# Patient Record
Sex: Female | Born: 1940 | Race: White | Hispanic: No | Smoking: Former smoker
Health system: Southern US, Community
[De-identification: ages and names within clinical notes are randomized; demographics above are authoritative.]

## PROBLEM LIST (undated history)

## (undated) DIAGNOSIS — K635 Polyp of colon: Secondary | ICD-10-CM

## (undated) DIAGNOSIS — K5792 Diverticulitis of intestine, part unspecified, without perforation or abscess without bleeding: Secondary | ICD-10-CM

## (undated) DIAGNOSIS — K228 Other specified diseases of esophagus: Secondary | ICD-10-CM

## (undated) DIAGNOSIS — I639 Cerebral infarction, unspecified: Secondary | ICD-10-CM

## (undated) DIAGNOSIS — T7840XA Allergy, unspecified, initial encounter: Secondary | ICD-10-CM

## (undated) DIAGNOSIS — K921 Melena: Secondary | ICD-10-CM

## (undated) DIAGNOSIS — K2289 Other specified disease of esophagus: Secondary | ICD-10-CM

## (undated) DIAGNOSIS — K922 Gastrointestinal hemorrhage, unspecified: Secondary | ICD-10-CM

## (undated) DIAGNOSIS — K2210 Ulcer of esophagus without bleeding: Secondary | ICD-10-CM

## (undated) DIAGNOSIS — D62 Acute posthemorrhagic anemia: Secondary | ICD-10-CM

## (undated) DIAGNOSIS — K920 Hematemesis: Secondary | ICD-10-CM

## (undated) DIAGNOSIS — K449 Diaphragmatic hernia without obstruction or gangrene: Secondary | ICD-10-CM

## (undated) DIAGNOSIS — E785 Hyperlipidemia, unspecified: Secondary | ICD-10-CM

## (undated) DIAGNOSIS — E1110 Type 2 diabetes mellitus with ketoacidosis without coma: Secondary | ICD-10-CM

## (undated) DIAGNOSIS — K219 Gastro-esophageal reflux disease without esophagitis: Secondary | ICD-10-CM

## (undated) DIAGNOSIS — E119 Type 2 diabetes mellitus without complications: Secondary | ICD-10-CM

## (undated) HISTORY — DX: Diaphragmatic hernia without obstruction or gangrene: K44.9

## (undated) HISTORY — DX: Type 2 diabetes mellitus without complications: E11.9

## (undated) HISTORY — DX: Hyperlipidemia, unspecified: E78.5

## (undated) HISTORY — DX: Diverticulitis of intestine, part unspecified, without perforation or abscess without bleeding: K57.92

## (undated) HISTORY — DX: Cerebral infarction, unspecified: I63.9

## (undated) HISTORY — DX: Polyp of colon: K63.5

## (undated) HISTORY — DX: Gastro-esophageal reflux disease without esophagitis: K21.9

## (undated) HISTORY — DX: Other specified diseases of esophagus: K22.8

## (undated) HISTORY — DX: Ulcer of esophagus without bleeding: K22.10

## (undated) HISTORY — PX: TONSILLECTOMY: SUR1361

## (undated) HISTORY — DX: Allergy, unspecified, initial encounter: T78.40XA

## (undated) HISTORY — DX: Other specified disease of esophagus: K22.89

---
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
## 1995-07-01 HISTORY — PX: CHOLECYSTECTOMY: SHX55

## 1998-06-28 ENCOUNTER — Other Ambulatory Visit: Admission: RE | Admit: 1998-06-28 | Discharge: 1998-06-28 | Payer: Self-pay | Admitting: Obstetrics and Gynecology

## 1999-07-03 ENCOUNTER — Other Ambulatory Visit: Admission: RE | Admit: 1999-07-03 | Discharge: 1999-07-03 | Payer: Self-pay | Admitting: Obstetrics and Gynecology

## 2000-07-28 ENCOUNTER — Other Ambulatory Visit: Admission: RE | Admit: 2000-07-28 | Discharge: 2000-07-28 | Payer: Self-pay | Admitting: Obstetrics and Gynecology

## 2002-05-24 ENCOUNTER — Other Ambulatory Visit: Admission: RE | Admit: 2002-05-24 | Discharge: 2002-05-24 | Payer: Self-pay | Admitting: Gynecology

## 2002-06-07 ENCOUNTER — Encounter: Admission: RE | Admit: 2002-06-07 | Discharge: 2002-06-07 | Payer: Self-pay | Admitting: Gynecology

## 2002-06-07 ENCOUNTER — Encounter: Payer: Self-pay | Admitting: Gynecology

## 2003-05-30 ENCOUNTER — Other Ambulatory Visit: Admission: RE | Admit: 2003-05-30 | Discharge: 2003-05-30 | Payer: Self-pay | Admitting: Gynecology

## 2004-07-17 ENCOUNTER — Other Ambulatory Visit: Admission: RE | Admit: 2004-07-17 | Discharge: 2004-07-17 | Payer: Self-pay | Admitting: Gynecology

## 2005-07-21 ENCOUNTER — Other Ambulatory Visit: Admission: RE | Admit: 2005-07-21 | Discharge: 2005-07-21 | Payer: Self-pay | Admitting: Gynecology

## 2006-08-05 ENCOUNTER — Other Ambulatory Visit: Admission: RE | Admit: 2006-08-05 | Discharge: 2006-08-05 | Payer: Self-pay | Admitting: Gynecology

## 2006-12-10 ENCOUNTER — Ambulatory Visit (HOSPITAL_COMMUNITY): Admission: RE | Admit: 2006-12-10 | Discharge: 2006-12-10 | Payer: Self-pay | Admitting: Endocrinology

## 2007-08-17 ENCOUNTER — Encounter: Admission: RE | Admit: 2007-08-17 | Discharge: 2007-08-17 | Payer: Self-pay | Admitting: Gynecology

## 2013-10-19 ENCOUNTER — Other Ambulatory Visit: Payer: Self-pay

## 2013-10-19 DIAGNOSIS — Z1231 Encounter for screening mammogram for malignant neoplasm of breast: Secondary | ICD-10-CM

## 2013-10-26 ENCOUNTER — Other Ambulatory Visit: Payer: Self-pay | Admitting: Family Medicine

## 2013-10-26 DIAGNOSIS — M810 Age-related osteoporosis without current pathological fracture: Secondary | ICD-10-CM

## 2013-10-31 ENCOUNTER — Ambulatory Visit: Payer: Self-pay

## 2013-11-11 ENCOUNTER — Ambulatory Visit
Admission: RE | Admit: 2013-11-11 | Discharge: 2013-11-11 | Disposition: A | Discharge status: Home / Self Care | Payer: Commercial Managed Care - HMO | Source: Ambulatory Visit | Attending: Family Medicine | Admitting: Family Medicine

## 2013-11-11 ENCOUNTER — Ambulatory Visit
Admission: RE | Admit: 2013-11-11 | Discharge: 2013-11-11 | Disposition: A | Discharge status: Home / Self Care | Payer: Commercial Managed Care - HMO | Source: Ambulatory Visit

## 2013-11-11 DIAGNOSIS — Z1231 Encounter for screening mammogram for malignant neoplasm of breast: Secondary | ICD-10-CM

## 2013-11-11 DIAGNOSIS — M810 Age-related osteoporosis without current pathological fracture: Secondary | ICD-10-CM

## 2015-03-30 ENCOUNTER — Encounter: Payer: Self-pay | Admitting: Internal Medicine

## 2015-03-30 ENCOUNTER — Ambulatory Visit (INDEPENDENT_AMBULATORY_CARE_PROVIDER_SITE_OTHER): Payer: Commercial Managed Care - HMO | Admitting: Internal Medicine

## 2015-03-30 VITALS — BP 122/62 | HR 81 | Temp 97.7°F | Resp 20 | Ht 61.5 in | Wt 148.8 lb

## 2015-03-30 DIAGNOSIS — Z794 Long term (current) use of insulin: Secondary | ICD-10-CM

## 2015-03-30 DIAGNOSIS — Z86010 Personal history of colonic polyps: Secondary | ICD-10-CM

## 2015-03-30 DIAGNOSIS — E1121 Type 2 diabetes mellitus with diabetic nephropathy: Secondary | ICD-10-CM | POA: Insufficient documentation

## 2015-03-30 DIAGNOSIS — E119 Type 2 diabetes mellitus without complications: Secondary | ICD-10-CM

## 2015-03-30 DIAGNOSIS — E785 Hyperlipidemia, unspecified: Secondary | ICD-10-CM

## 2015-03-30 NOTE — Patient Instructions (Signed)
We have gotten the referral for GI put in so you should hear about that.

## 2015-03-30 NOTE — Assessment & Plan Note (Signed)
Referring to GI and she has records from prior colonoscopy with Dr. Benson Norway.

## 2015-03-30 NOTE — Progress Notes (Signed)
   Subjective:    Patient ID: Katie Lozano, female    DOB: June 08, 1941, 74 y.o.   MRN: [id number]  HPI The patient is a new 74 YO female coming in for colonic polyps. She had colonoscopy about 1 year ago with Dr. Benson Norway and he recommended that she have a repeat in 1 year. Due to a negative experience with the anaesthesiologist at that encounter she does not wish to return to that doctor. She is worried about the polyps concerned that they may be cancer. No change in bowel habits. No blood in her stool. Denies diarrhea and constipation.   PMH, [facility], social history reviewed and updated.   Review of Systems  Constitutional: Negative for fever, activity change, appetite change, fatigue and unexpected weight change.  HENT: Negative.   Eyes: Negative.   Respiratory: Negative for apnea, cough, chest tightness, shortness of breath and wheezing.   Cardiovascular: Negative for chest pain, palpitations and leg swelling.  Gastrointestinal: Negative for nausea, abdominal pain, diarrhea, constipation and abdominal distention.  Musculoskeletal: Negative.   Skin: Negative.   Neurological: Negative.   Psychiatric/Behavioral: Negative.       Objective:   Physical Exam  Constitutional: She is oriented to person, place, and time. She appears well-developed and well-nourished.  HENT:  Head: Normocephalic and atraumatic.  Eyes: EOM are normal.  Neck: Normal range of motion.  Cardiovascular: Normal rate and regular rhythm.   Pulmonary/Chest: Effort normal and breath sounds normal. No respiratory distress. She has no wheezes. She has no rales.  Abdominal: Soft. Bowel sounds are normal. She exhibits no distension. There is no tenderness. There is no rebound.  Musculoskeletal: She exhibits no edema.  Neurological: She is alert and oriented to person, place, and time. Coordination normal.  Skin: Skin is warm and dry.  Psychiatric: She has a normal mood and affect.   Filed Vitals:   03/30/15 1453  BP:  122/62  Pulse: 81  Temp: 97.7 F (36.5 C)  TempSrc: Oral  Resp: 20  Height: 5' 1.5" (1.562 m)  Weight: 148 lb 12.8 oz (67.495 kg)  SpO2: 97%      Assessment & Plan:

## 2015-03-30 NOTE — Assessment & Plan Note (Addendum)
Follows with endocrinology, not complicated. On humalog and lantus regimen, denies hypoglycemia. Getting records.

## 2015-03-30 NOTE — Assessment & Plan Note (Signed)
Is on zocor and zetia and per her reports well controlled. Getting records as lipids recently checked.

## 2015-03-30 NOTE — Progress Notes (Signed)
Pre visit review using our clinic review tool, if applicable. No additional management support is needed unless otherwise documented below in the visit note. 

## 2015-04-30 ENCOUNTER — Telehealth: Payer: Self-pay | Admitting: Gastroenterology

## 2015-04-30 NOTE — Telephone Encounter (Signed)
Received records from Dr. Liliane Channel office and placed on Dr. Doyne Keel desk for review.

## 2015-04-30 NOTE — Telephone Encounter (Signed)
Patient brought in records from [facility] to be reviewed. She states that the reason why she is transferring is because she is afraid to go back there because of the anesthesologist. Patient is requesting to be seen by Dr. Havery Moros.  We are waiting on records from Dr. Liliane Channel office. Patient states that she had a colonoscopy there about 5-6 years ago.

## 2015-06-18 NOTE — Telephone Encounter (Signed)
Dr. Havery Moros reviewed records and has accepted patient. Ok to schedule OV. Spoke to patient and I will schedule once Dr. Havery Moros has an opening.

## 2015-06-19 ENCOUNTER — Encounter: Payer: Self-pay | Admitting: Gastroenterology

## 2015-07-30 ENCOUNTER — Encounter: Payer: Self-pay | Admitting: Gastroenterology

## 2015-07-30 ENCOUNTER — Ambulatory Visit (INDEPENDENT_AMBULATORY_CARE_PROVIDER_SITE_OTHER): Payer: Medicare Other | Admitting: Gastroenterology

## 2015-07-30 VITALS — BP 120/54 | HR 80 | Ht 62.0 in | Wt 149.2 lb

## 2015-07-30 DIAGNOSIS — Z86010 Personal history of colonic polyps: Secondary | ICD-10-CM | POA: Diagnosis not present

## 2015-07-30 MED ORDER — NA SULFATE-K SULFATE-MG SULF 17.5-3.13-1.6 GM/177ML PO SOLN: ORAL | Status: DC

## 2015-07-30 NOTE — Progress Notes (Signed)
HPI :  75 y/o female here due to a history of colon polyps.    Her last colonoscopy was done by Dr. Benson Norway, in October 2015, he removed 4 polyps from her colon in the ascending colon, the largest 66mm in size, the rest around 92mm or so. Pathology not available for this exam. He reported it was a technically challenging exam but in the exam report he notes the polyps were completely resected and retrieved. The patient reports she was contacted by Dr Ulyses Amor office April 2016 who told her she needed another colonoscopy at that time because it was a technically challenging exam and had a very hard time removing the largest polyp and there was concern for residual polyp at the site. I don't have records of this recommendation from Dr. Ulyses Amor office. 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   She has no bowel symptoms and in general states she feels quite well. No blood in the stools. No changes in her bowel habits. No abdominal pains. Uncle had colon cancer at age 71s, but several family members had colon polyps.  She states during her last colonoscopy, her IV infiltrated and she had a significant amount of propofol in the subcutaneous tissue in her arm and that it was extremely painful, and she wishes to avoid this happening again if at all possible.   Endoscopic history Colonoscopy 03/2014 - 50mm ascending polyp, 3 additional small ascending colon polyps, diverticulosis,  Colonoscopy 2010 - small polyp removed   Past Medical History  Diagnosis Date  . Diabetes mellitus without complication ([place])   . Diverticulitis   . GERD (gastroesophageal reflux disease)   . Allergy   . Colon polyp      Past Surgical History  Procedure Laterality Date  . Cholecystectomy  1997  . Appendectomy  1956  . Tonsillectomy      age 50   Family History  Problem Relation Age of Onset  . Heart disease Mother   .  Heart disease Father     MI  . Diabetes Father   . Colon cancer Maternal Uncle   . Colon polyps Maternal Uncle   . Melanoma Mother    Social History  Substance Use Topics  . Smoking status: Former Smoker    Types: Cigarettes    Quit date: 06/29/2012  . Smokeless tobacco: Never Used  . Alcohol Use: No   Current Outpatient Prescriptions  Medication Sig Dispense Refill  . ACCU-CHEK SMARTVIEW test strip Check blood sugar 4 times daily    . Alcohol Swabs (ALCOHOL PREP) PADS     . Calcium Carb-Cholecalciferol (CALCIUM + D3 PO) Take by mouth.    . Cholecalciferol (D3 SUPER STRENGTH) 2000 UNITS CAPS Take by mouth.    . doxycycline (VIBRAMYCIN) 50 MG capsule Take 50 mg by mouth 2 (two) times daily. Tapering    . HUMALOG KWIKPEN 200 UNIT/ML SOPN Inject 20 Units into the skin 3 (three) times daily.     . Hypochlorous Acid (AVENOVA WITH NEUTROX EX) Apply 1 application topically 2 (two) times daily.    Marland Kitchen LANTUS 100 UNIT/ML injection Inject 44 Units into the skin at bedtime.     Marland Kitchen lisinopril (PRINIVIL,ZESTRIL) 10 MG tablet Take 10 mg by mouth daily.    . Multiple Vitamin (MULTI VITAMIN DAILY) TABS Take by mouth.    . neomycin-polymyxin-hydrocortisone (CORTISPORIN) 3.5-10000-0.5 cream Apply 1 mL topically 2 (two) times daily.    Marland Kitchen  simvastatin (ZOCOR) 20 MG tablet Take 20 mg by mouth daily at 6 PM.     . ZETIA 10 MG tablet Take 10 mg by mouth daily.      No current facility-administered medications for this visit.   No Known Allergies   Review of Systems: All systems reviewed and negative except where noted in HPI.    No results found for: CREATININE, BUN, NA, K, CL, CO2  No results found for: WBC, HGB, HCT, MCV, PLT   Physical Exam: BP 120/54 mmHg  Pulse 80  Ht 5\' 2"  (1.575 m)  Wt 149 lb 4 oz (67.699 kg)  BMI 27.29 kg/m2 Constitutional: Pleasant,well-developed, female in no acute distress. HEENT: Normocephalic and atraumatic. Conjunctivae are normal. No scleral icterus. Neck  supple.  Cardiovascular: Normal rate, regular rhythm.  Pulmonary/chest: Effort normal and breath sounds normal. No wheezing, rales or rhonchi. Abdominal: Soft, nondistended, nontender. Bowel sounds active throughout. There are no masses palpable. No hepatomegaly. Extremities: no edema Lymphadenopathy: No cervical adenopathy noted. Neurological: Alert and oriented to person place and time. Skin: Skin is warm and dry. No rashes noted. Psychiatric: Normal mood and affect. Behavior is normal.   ASSESSMENT AND PLAN: 75 y/o female with a history of colon polyps, multiple adenomas and tubulovillous adenomas per review of pathology results, here for a surveillance colonoscopy. Her last exam was reviewed as outlined above. She had 4 polyps removed in the ascending colon in 2015, the largest 70mm in size. Per the report these were completely removed, however the patient reports she was called and told to come back by Dr. Ulyses Amor office for a follow up exam given a concern for residual polyp at the largest polyp site due to technically challenging polypectomy. I don't see this recommendation in her records however we will reach out to Dr. Ulyses Amor office to verify.   I otherwise discussed colonoscopy with her, and offered her a surveillance exam based on what she is reporting today. She is anxious about this and wished to proceed based on the phone call she received. The indications, risks, and benefits of colonoscopy were explained to the patient in detail. Risks include but are not limited to bleeding, perforation, adverse reaction to medications, and cardiopulmonary compromise. Sequelae include but are not limited to the possibility of surgery, hospitalization, and mortality. The patient verbalized understanding and wished to proceed. All questions answered, referred to the scheduler and bowel prep ordered. Further recommendations pending results of the exam. 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    Cellar, MD [facility] Pager [phone]  CC: Hoyt Koch, *

## 2015-07-30 NOTE — Patient Instructions (Signed)
You have been scheduled for a colonoscopy. Please follow written instructions given to you at your visit today.  Please pick up your prep supplies at the pharmacy within the next 1-3 days. If you use inhalers (even only as needed), please bring them with you on the day of your procedure. Your physician has requested that you go to www.startemmi.com and enter the access code given to you at your visit today. This web site gives a general overview about your procedure. However, you should still follow specific instructions given to you by our office regarding your preparation for the procedure.  We have sent the following medications to your pharmacy for you to pick up at your convenience: Suprep  

## 2015-08-01 ENCOUNTER — Telehealth: Payer: Self-pay | Admitting: Gastroenterology

## 2015-08-01 NOTE — Telephone Encounter (Signed)
Called pt and informed her that there will be a sample at the front desk for her and she may pick up during normal business hours. Patient understands.

## 2015-08-28 ENCOUNTER — Telehealth: Payer: Self-pay | Admitting: Gastroenterology

## 2015-08-29 NOTE — Telephone Encounter (Signed)
Spoke to pt about benefits

## 2015-09-04 ENCOUNTER — Ambulatory Visit (AMBULATORY_SURGERY_CENTER): Payer: Medicare Other | Admitting: Gastroenterology

## 2015-09-04 ENCOUNTER — Encounter: Payer: Self-pay | Admitting: Gastroenterology

## 2015-09-04 VITALS — BP 130/58 | HR 78 | Temp 98.0°F | Resp 18 | Ht 62.0 in | Wt 149.0 lb

## 2015-09-04 DIAGNOSIS — Z860100 Personal history of colon polyps, unspecified: Secondary | ICD-10-CM

## 2015-09-04 DIAGNOSIS — D122 Benign neoplasm of ascending colon: Secondary | ICD-10-CM | POA: Diagnosis not present

## 2015-09-04 DIAGNOSIS — D125 Benign neoplasm of sigmoid colon: Secondary | ICD-10-CM

## 2015-09-04 MED ORDER — SODIUM CHLORIDE 0.9 % IV SOLN: 500.0000 mL | INTRAVENOUS | Status: DC

## 2015-09-04 NOTE — Op Note (Signed)
[place]  [address] [place], [address]   COLONOSCOPY PROCEDURE REPORT  PATIENT: Katie Lozano, Katie Lozano  MR#: [id number] BIRTHDATE: January 18, 1941 , 74  yrs. old GENDER: female ENDOSCOPIST: Yetta Flock, MD REFERRED BY: Pricilla Holm MD PROCEDURE DATE:  09/04/2015 PROCEDURE:   Colonoscopy, surveillance , Colonoscopy with snare polypectomy, and Colonoscopy with biopsy First Screening Colonoscopy - Avg.  risk and is 50 yrs.  old or older - No.  Prior Negative Screening - Now for repeat screening. N/A  History of Adenoma - Now for follow-up colonoscopy & has been > or = to 3 yrs.  No.  It has been less than 3 yrs since last colonoscopy.  Other: See Comments  Polyps removed today? Yes ASA CLASS:   Class III INDICATIONS:Surveillance due to prior colonic neoplasia and Colorectal Neoplasm Risk Assessment for this procedure is average risk. MEDICATIONS: Propofol 300 mg IV  DESCRIPTION OF PROCEDURE:   After the risks benefits and alternatives of the procedure were thoroughly explained, informed consent was obtained.  The digital rectal exam revealed no abnormalities of the rectum.   The LB [id number]  endoscope was introduced through the anus and advanced to the cecum, which was identified by both the appendix and ileocecal valve. No adverse events experienced.   The quality of the prep was adequate  The instrument was then slowly withdrawn as the colon was fully examined. Estimated blood loss is zero unless otherwise noted in this procedure report.   COLON FINDINGS: Severe diverticulosis with narrowed colon lumen and angulated turns were noted in the left colon.  Mild diverticulosis was noted in the transverse and right colon.  A 44mm sessile polyp was noted in the ascending colon and removed with cold snare.  A 74mm sessile polyp was noted in the ascending colon and removed with cold forceps.  A 81mm sessile polyp was noted in the sigmoid colon and  removed with cold snare.  The remainder of the examined colon appeared normal.  Retroflexed views revealed internal hemorrhoids. The time to cecum = 6.7 Withdrawal time = 16.1   The scope was withdrawn and the procedure completed. COMPLICATIONS: There were no immediate complications.  ENDOSCOPIC IMPRESSION: Severe diverticulosis as outlined above 3 small colon polyps removed Small internal hemorrhoids  RECOMMENDATIONS: Await pathology results No NSAIDs for 2 weeks Resume diet Resume medications eSigned:  Yetta Flock, MD 09/04/2015 2:00 PM   cc: Pricilla Holm MD, the patient

## 2015-09-04 NOTE — Progress Notes (Signed)
Patient awakening,vss,report to rn 

## 2015-09-04 NOTE — Progress Notes (Signed)
Called to room to assist during endoscopic procedure.  Patient ID and intended procedure confirmed with present staff. Received instructions for my participation in the procedure from the performing physician.  

## 2015-09-05 ENCOUNTER — Telehealth: Payer: Self-pay | Admitting: Emergency Medicine

## 2015-09-05 NOTE — Telephone Encounter (Signed)
Left message, identifier present, f/u 

## 2015-09-11 ENCOUNTER — Encounter: Payer: Self-pay | Admitting: Gastroenterology

## 2016-05-26 ENCOUNTER — Telehealth: Payer: Self-pay | Admitting: Emergency Medicine

## 2016-05-26 NOTE — Telephone Encounter (Signed)
Pt called and wanted to let you know her blood pressure dropped this weekend. 911 was called and they ran some test. She was told it might be a Vasovagal attack. She didn't feel like she needed to go to the hospital since her blood pressure went back up and her color was better. She just wanted you to be aware and see if she needs to make an OV. Please advise thanks.

## 2016-05-27 NOTE — Telephone Encounter (Signed)
Would recommend visit if she is still having symptoms.

## 2016-05-27 NOTE — Telephone Encounter (Signed)
Patient is not having any symptoms or problems. She stated that she was not having any problems when the episode happened other than feeling tired. She is going to schedule an office visit to come in and talk about it just to be safe.

## 2016-06-05 ENCOUNTER — Observation Stay (HOSPITAL_COMMUNITY)
Admission: EM | Admit: 2016-06-05 | Discharge: 2016-06-06 | Disposition: A | Discharge status: Home / Self Care | Payer: Medicare Other | Attending: Internal Medicine | Admitting: Internal Medicine

## 2016-06-05 ENCOUNTER — Emergency Department (HOSPITAL_COMMUNITY): Payer: Medicare Other

## 2016-06-05 ENCOUNTER — Encounter (HOSPITAL_COMMUNITY): Payer: Self-pay | Admitting: *Deleted

## 2016-06-05 DIAGNOSIS — G451 Carotid artery syndrome (hemispheric): Principal | ICD-10-CM | POA: Diagnosis present

## 2016-06-05 DIAGNOSIS — R531 Weakness: Secondary | ICD-10-CM | POA: Diagnosis present

## 2016-06-05 DIAGNOSIS — Z794 Long term (current) use of insulin: Secondary | ICD-10-CM | POA: Diagnosis not present

## 2016-06-05 DIAGNOSIS — E1121 Type 2 diabetes mellitus with diabetic nephropathy: Secondary | ICD-10-CM | POA: Insufficient documentation

## 2016-06-05 DIAGNOSIS — Z8673 Personal history of transient ischemic attack (TIA), and cerebral infarction without residual deficits: Secondary | ICD-10-CM | POA: Diagnosis not present

## 2016-06-05 DIAGNOSIS — Z87891 Personal history of nicotine dependence: Secondary | ICD-10-CM | POA: Insufficient documentation

## 2016-06-05 DIAGNOSIS — E785 Hyperlipidemia, unspecified: Secondary | ICD-10-CM | POA: Insufficient documentation

## 2016-06-05 DIAGNOSIS — R590 Localized enlarged lymph nodes: Secondary | ICD-10-CM | POA: Diagnosis not present

## 2016-06-05 DIAGNOSIS — Z66 Do not resuscitate: Secondary | ICD-10-CM | POA: Insufficient documentation

## 2016-06-05 DIAGNOSIS — E876 Hypokalemia: Secondary | ICD-10-CM | POA: Diagnosis not present

## 2016-06-05 DIAGNOSIS — K219 Gastro-esophageal reflux disease without esophagitis: Secondary | ICD-10-CM | POA: Insufficient documentation

## 2016-06-05 DIAGNOSIS — I10 Essential (primary) hypertension: Secondary | ICD-10-CM | POA: Diagnosis not present

## 2016-06-05 DIAGNOSIS — D72829 Elevated white blood cell count, unspecified: Secondary | ICD-10-CM | POA: Diagnosis not present

## 2016-06-05 DIAGNOSIS — N183 Chronic kidney disease, stage 3 unspecified: Secondary | ICD-10-CM | POA: Diagnosis present

## 2016-06-05 DIAGNOSIS — E1122 Type 2 diabetes mellitus with diabetic chronic kidney disease: Secondary | ICD-10-CM | POA: Diagnosis not present

## 2016-06-05 DIAGNOSIS — R55 Syncope and collapse: Secondary | ICD-10-CM

## 2016-06-05 DIAGNOSIS — I129 Hypertensive chronic kidney disease with stage 1 through stage 4 chronic kidney disease, or unspecified chronic kidney disease: Secondary | ICD-10-CM | POA: Diagnosis not present

## 2016-06-05 DIAGNOSIS — I447 Left bundle-branch block, unspecified: Secondary | ICD-10-CM | POA: Insufficient documentation

## 2016-06-05 DIAGNOSIS — G459 Transient cerebral ischemic attack, unspecified: Secondary | ICD-10-CM

## 2016-06-05 DIAGNOSIS — Z79899 Other long term (current) drug therapy: Secondary | ICD-10-CM | POA: Insufficient documentation

## 2016-06-05 LAB — CBC
HEMATOCRIT: 42.8 % (ref 36.0–46.0)
HEMOGLOBIN: 14.7 g/dL (ref 12.0–15.0)
MCH: 33.4 pg (ref 26.0–34.0)
MCHC: 34.3 g/dL (ref 30.0–36.0)
MCV: 97.3 fL (ref 78.0–100.0)
Platelets: 267 10*3/uL (ref 150–400)
RBC: 4.4 MIL/uL (ref 3.87–5.11)
RDW: 12.9 % (ref 11.5–15.5)
WBC: 11.5 10*3/uL — AB (ref 4.0–10.5)

## 2016-06-05 LAB — TROPONIN I: Troponin I: <0.03 ng/mL (ref <0.03)

## 2016-06-05 LAB — BASIC METABOLIC PANEL
Anion gap: 11 (ref 5–15)
BUN: 27 mg/dL — ABNORMAL HIGH (ref 6–20)
CHLORIDE: 103 mmol/L (ref 101–111)
CO2: 24 mmol/L (ref 22–32)
Calcium: 10.1 mg/dL (ref 8.9–10.3)
Creatinine, Ser: 1.36 mg/dL — ABNORMAL HIGH (ref 0.44–1.00)
GFR calc non Af Amer: 37 mL/min — ABNORMAL LOW (ref >60)
GFR, EST AFRICAN AMERICAN: 43 mL/min — AB (ref >60)
Glucose, Bld: 90 mg/dL (ref 65–99)
POTASSIUM: 3.3 mmol/L — AB (ref 3.5–5.1)
SODIUM: 138 mmol/L (ref 135–145)

## 2016-06-05 LAB — URINALYSIS, ROUTINE W REFLEX MICROSCOPIC
Bilirubin Urine: NEGATIVE
GLUCOSE, UA: NEGATIVE mg/dL
HGB URINE DIPSTICK: NEGATIVE
Ketones, ur: 15 mg/dL — AB
Nitrite: NEGATIVE
PH: 5 (ref 5.0–8.0)
PROTEIN: NEGATIVE mg/dL

## 2016-06-05 LAB — URINALYSIS, MICROSCOPIC (REFLEX): RBC / HPF: NONE SEEN RBC/hpf (ref 0–5)

## 2016-06-05 LAB — CBG MONITORING, ED: Glucose-Capillary: 272 mg/dL — ABNORMAL HIGH (ref 65–99)

## 2016-06-05 MED ORDER — POTASSIUM CHLORIDE CRYS ER 20 MEQ PO TBCR
40.0000 meq | EXTENDED_RELEASE_TABLET | Freq: Two times a day (BID) | ORAL | Status: AC
  Administered 2016-06-06 (×2): 40 meq via ORAL
  Filled 2016-06-05 (×2): qty 2

## 2016-06-05 NOTE — ED Provider Notes (Signed)
Emergency Department Provider Note   I have reviewed the triage vital signs and the nursing notes.   HISTORY  Chief Complaint Weakness   HPI PHEONIX LANGELIER is a 75 y.o. female with PMH of GERD, HLD, and DM presents to the emergency department for evaluation of sudden onset generalized fatigue, seeing stars in her vision, left arm numbness, and slurred speech per her sister who spoke with her on the phone during the event. He states that she had a similar episode around Thanksgiving but was evaluated by family. They decided to call EMS to gave her oxygen for low oxygen levels that she felt better and refused transport to the emergency department at that time. She reports that in March she was told by friend that she may have had a TIA after describing some symptoms that she had at that time and reports having an outpatient echocardiogram which was reportedly normal.   Patient has been compliant with her home medications. During episodes she denies any chest pain or dyspnea. She had some mild abdominal discomfort that is since resolved. No fevers or chills.   Past Medical History:  Diagnosis Date  . Allergy   . Colon polyp   . Diabetes mellitus without complication ([place])   . Diverticulitis   . GERD (gastroesophageal reflux disease)   . Hyperlipidemia     Patient Active Problem List   Diagnosis Date Noted  . Near syncope 06/05/2016  . Diabetes mellitus type 2, insulin dependent ([place]) 03/30/2015  . History of colonic polyps 03/30/2015  . Hyperlipidemia 03/30/2015    Past Surgical History:  Procedure Laterality Date  . APPENDECTOMY  1956  . CHOLECYSTECTOMY  1997  . TONSILLECTOMY     age 15    Current Outpatient Rx  . Order #: [id number] Class: Historical Med  . Order #: [id number] Class: Historical Med  . Order #: [id number] Class: Historical Med  . Order #: [id number] Class: Historical Med  . Order #: [id number] Class: Historical Med  . Order #: [id number] Class: Historical Med    . Order #: [id number] Class: Historical Med  . Order #: [id number] Class: Historical Med  . Order #: [id number] Class: Historical Med  . Order #[id number] Class: Historical Med    Allergies Patient has no known allergies.  Family History  Problem Relation Age of [place]  . Heart disease Mother   . Melanoma Mother   . Heart disease Father     MI  . Diabetes Father   . Colon cancer Maternal Uncle   . Colon polyps Maternal Uncle     Social History Social History  Substance Use Topics  . Smoking status: Former Smoker    Types: Cigarettes    Quit date: 06/29/2012  . Smokeless tobacco: Never Used  . Alcohol use No    Review of Systems  Constitutional: No fever/chills Eyes: Positive flashes in vision (resolved) ENT: No sore throat. Cardiovascular: Denies chest pain. Respiratory: Denies shortness of breath. Gastrointestinal: No abdominal pain.  No nausea, no vomiting.  No diarrhea.  No constipation. Genitourinary: Negative for dysuria. Musculoskeletal: Negative for back pain. Skin: Negative for rash. Neurological: Negative for headaches or focal weakness. Positive left arm numbness. Positive slurred speech per sister at bedside.   10-point ROS otherwise negative.  ____________________________________________   PHYSICAL EXAM:  VITAL SIGNS: ED Triage Vitals  Enc Vitals Group     BP 06/05/16 1909 123/75     Pulse Rate 06/05/16 1909 95     Resp 06/05/16 1909 16     Temp  06/05/16 1909 97.6 F (36.4 C)     Temp Source 06/05/16 1909 Oral     SpO2 06/05/16 1909 96 %     Pain Score 06/05/16 1911 5    Constitutional: Alert and oriented. Well appearing and in no acute distress. Eyes: Conjunctivae are normal.  Head: Atraumatic. Nose: No congestion/rhinnorhea. Mouth/Throat: Mucous membranes are moist.  Oropharynx non-erythematous. Neck: No stridor.  Cardiovascular: Normal rate, regular rhythm. Good peripheral circulation. Grossly normal heart sounds.   Respiratory: Normal  respiratory effort.  No retractions. Lungs CTAB. Gastrointestinal: Soft and nontender. No distention.  Musculoskeletal: No lower extremity tenderness nor edema. No gross deformities of extremities. Neurologic:  Normal speech and language. No gross focal neurologic deficits are appreciated. No pronator drift. Skin:  Skin is warm, dry and intact. No rash noted. Psychiatric: Mood and affect are normal. Speech and behavior are normal.  ____________________________________________   LABS (all labs ordered are listed, but only abnormal results are displayed)  Labs Reviewed  BASIC METABOLIC PANEL - Abnormal; Notable for the following:       Result Value   Potassium 3.3 (*)    BUN 27 (*)    Creatinine, Ser 1.36 (*)    GFR calc non Af Amer 37 (*)    GFR calc Af Amer 43 (*)    All other components within normal limits  CBC - Abnormal; Notable for the following:    WBC 11.5 (*)    All other components within normal limits  URINALYSIS, ROUTINE W REFLEX MICROSCOPIC - Abnormal; Notable for the following:    Specific Gravity, Urine >1.030 (*)    Ketones, ur 15 (*)    Leukocytes, UA SMALL (*)    All other components within normal limits  URINALYSIS, MICROSCOPIC (REFLEX) - Abnormal; Notable for the following:    Bacteria, UA RARE (*)    Squamous Epithelial / LPF 0-5 (*)    All other components within normal limits  CBG MONITORING, ED - Abnormal; Notable for the following:    Glucose-Capillary 272 (*)    All other components within normal limits  TROPONIN I  MAGNESIUM  HEMOGLOBIN A1C  LIPID PANEL   ____________________________________________  EKG   EKG Interpretation  Date/Time:  Thursday June 05 2016 19:07:22 EST Ventricular Rate:  95 PR Interval:  156 QRS Duration: 126 QT Interval:  396 QTC Calculation: 497 R Axis:   -33 Text Interpretation:  Normal sinus rhythm Left axis deviation Left bundle branch block Abnormal ECG Abnormal ekg Confirmed by Carmin Muskrat  MD [phone])  on 06/05/2016 7:24:19 PM       ____________________________________________  RADIOLOGY  Dg Chest 2 View  Result Date: 06/05/2016 CLINICAL DATA:  75 y/o F; dyspnea, left arm numbness, and abdominal pain today. EXAM: CHEST  2 VIEW COMPARISON:  None. FINDINGS: Normal cardiac silhouette. Clear lungs. Minimal reverse S curvature of the thoracic spine. Cholecystectomy clips. No acute osseous abnormality is identified. No pleural effusion. IMPRESSION: No active cardiopulmonary disease. Electronically Signed   By: Kristine Garbe M.D.   On: 06/05/2016 21:35   Ct Head Wo Contrast  Result Date: 06/05/2016 CLINICAL DATA:  LEFT arm numbness and tingling today. Assess for transient ischemic attack. History of hyperlipidemia, diabetes. EXAM: CT HEAD WITHOUT CONTRAST TECHNIQUE: Contiguous axial images were obtained from the base of the skull through the vertex without intravenous contrast. COMPARISON:  None. FINDINGS: BRAIN: The ventricles and sulci are normal for age. No intraparenchymal hemorrhage, mass effect nor midline shift. Patchy supratentorial white matter  hypodensities less than expected for patient's age, though non-specific are most compatible with chronic small vessel ischemic disease. No acute large vascular territory infarcts. No abnormal extra-axial fluid collections. Basal cisterns are patent. VASCULAR: Moderate calcific atherosclerosis of the carotid siphons. SKULL: No skull fracture. Severe LEFT temporomandibular osteoarthrosis. No significant scalp soft tissue swelling. SINUSES/ORBITS: Soft tissue opacifies the RIGHT maxillary sinus with extensive mucoperiosteal reaction. Mild RIGHT fronto ethmoidal mucosal thickening. The included ocular globes and orbital contents are non-suspicious. OTHER: None. IMPRESSION: Negative CT HEAD for age. Chronic RIGHT paranasal sinusitis. Electronically Signed   By: Elon Alas M.D.   On: 06/05/2016 22:03     ____________________________________________   PROCEDURES  Procedure(s) performed:   Procedures  None ____________________________________________   INITIAL IMPRESSION / ASSESSMENT AND PLAN / ED COURSE  Pertinent labs & imaging results that were available during my care of the patient were reviewed by me and considered in my medical decision making (see chart for details).  Patient resents to the emergency department for evaluation of generalized fatigue, speech disturbance, left arm numbness. Unclear if this represents a TIA or near syncopal event. Patient with no chest pain. No focal neurological deficits at this time. EKG shows a left bundle branch block with no old tracing for comparison. Vital signs are otherwise unremarkable. Plan for labs including troponin, chest x-ray, and head CT for possible TIA. Suspect the patient would benefit from observational admission for TIA workup and further monitoring along with medical optimization.   Discussed patient's case with hospitalist, Dr. Loleta Books.  Recommend admission to tele, obs bed.  I will place holding orders per their request. Patient and family (if present) updated with plan. Care transferred to hospitlaist service.  I reviewed all nursing notes, vitals, pertinent old records, EKGs, labs, imaging (as available).  ____________________________________________  FINAL CLINICAL IMPRESSION(S) / ED DIAGNOSES  Final diagnoses:  Weakness  Near syncope     MEDICATIONS GIVEN DURING THIS VISIT:  Medications  potassium chloride SA (K-DUR,KLOR-CON) CR tablet 40 mEq (not administered)     NEW OUTPATIENT MEDICATIONS STARTED DURING THIS VISIT:  None   Note:  This document was prepared using Dragon voice recognition software and may include unintentional dictation errors.  Nanda Quinton, MD Emergency Medicine   Margette Fast, MD 06/05/16 [phone]

## 2016-06-05 NOTE — ED Notes (Signed)
Patient transported to X-ray 

## 2016-06-05 NOTE — ED Notes (Addendum)
Pt still unable to provide a urine sample. Pt provided with sprite zero.

## 2016-06-05 NOTE — ED Notes (Signed)
Pt states she went to the restroom today around 4pm when she suddenly had some lower abd pain and c/o her left arm feeling numb and tingly. Pt states she felt very weak and could not get up  "for about 45 mins" Pt states when she finally was able to get up she went to bed and fell asleep. Pt denies any numbness at this time and denies and CP or SHOB. NAD noted.

## 2016-06-05 NOTE — ED Notes (Signed)
Pt coming from home c/o weakness and left arm numbness around 4pm today. Pt A&Ox4. Pt passed swallow screen and neuro assessment was negative for any signs of a stroke. CT scan was negative as well, plan is for an MRI in the morning. Dr. Loleta Books requested medical records from [facility]. Request sent and MR pending. Pt independent and ambulatory to the restroom.

## 2016-06-05 NOTE — ED Notes (Signed)
Called lab to add on A1c and lipid panel

## 2016-06-05 NOTE — ED Notes (Signed)
Pt aware of need for urine sample. Pt unable to void at this time.  

## 2016-06-05 NOTE — ED Notes (Signed)
Admitting MD at bedside.

## 2016-06-05 NOTE — ED Notes (Signed)
ED Provider at bedside. 

## 2016-06-05 NOTE — ED Triage Notes (Addendum)
Pt states "I'm under surveillance for a heart ache on Thanksgiving, but the doctor doesn't think I had a heart attack and I disagree." Reports having generalized weakness today that has since subsided. Denies chest pain, denies sob, states "I just felt like I should come in." Has had lower abdominal pain since eating an hour and half ago

## 2016-06-05 NOTE — ED Notes (Signed)
Pt returned from radiology and connected to the monitor. 

## 2016-06-06 ENCOUNTER — Encounter (HOSPITAL_COMMUNITY): Payer: Self-pay | Admitting: Family Medicine

## 2016-06-06 ENCOUNTER — Observation Stay (HOSPITAL_COMMUNITY): Payer: Medicare Other

## 2016-06-06 ENCOUNTER — Other Ambulatory Visit: Payer: Self-pay | Admitting: Neurology

## 2016-06-06 ENCOUNTER — Observation Stay (HOSPITAL_BASED_OUTPATIENT_CLINIC_OR_DEPARTMENT_OTHER): Payer: Medicare Other

## 2016-06-06 DIAGNOSIS — G451 Carotid artery syndrome (hemispheric): Secondary | ICD-10-CM | POA: Diagnosis not present

## 2016-06-06 DIAGNOSIS — I10 Essential (primary) hypertension: Secondary | ICD-10-CM | POA: Diagnosis not present

## 2016-06-06 DIAGNOSIS — E1121 Type 2 diabetes mellitus with diabetic nephropathy: Secondary | ICD-10-CM | POA: Diagnosis not present

## 2016-06-06 DIAGNOSIS — Z794 Long term (current) use of insulin: Secondary | ICD-10-CM | POA: Diagnosis not present

## 2016-06-06 DIAGNOSIS — N183 Chronic kidney disease, stage 3 unspecified: Secondary | ICD-10-CM | POA: Diagnosis present

## 2016-06-06 DIAGNOSIS — E876 Hypokalemia: Secondary | ICD-10-CM

## 2016-06-06 LAB — GLUCOSE, CAPILLARY
Glucose-Capillary: 285 mg/dL — ABNORMAL HIGH (ref 65–99)
Glucose-Capillary: 334 mg/dL — ABNORMAL HIGH (ref 65–99)
Glucose-Capillary: 382 mg/dL — ABNORMAL HIGH (ref 65–99)

## 2016-06-06 LAB — LIPID PANEL
CHOL/HDL RATIO: 3.7 ratio
CHOLESTEROL: 161 mg/dL (ref 0–200)
HDL: 43 mg/dL (ref >40)
LDL Cholesterol: 48 mg/dL (ref 0–99)
TRIGLYCERIDES: 350 mg/dL — AB (ref <150)
VLDL: 70 mg/dL — ABNORMAL HIGH (ref 0–40)

## 2016-06-06 LAB — POTASSIUM: POTASSIUM: 4.2 mmol/L (ref 3.5–5.1)

## 2016-06-06 LAB — MAGNESIUM: MAGNESIUM: 2.2 mg/dL (ref 1.7–2.4)

## 2016-06-06 MED ORDER — LISINOPRIL 10 MG PO TABS
10.0000 mg | ORAL_TABLET | Freq: Every day | ORAL | Status: DC
  Administered 2016-06-06: 10 mg via ORAL
  Filled 2016-06-06: qty 1

## 2016-06-06 MED ORDER — INSULIN GLARGINE 100 UNIT/ML ~~LOC~~ SOLN
55.0000 [IU] | Freq: Every day | SUBCUTANEOUS | Status: DC
Start: 2016-06-06 — End: 2016-06-06
  Administered 2016-06-06: 55 [IU] via SUBCUTANEOUS
  Filled 2016-06-06 (×2): qty 0.55

## 2016-06-06 MED ORDER — PANTOPRAZOLE SODIUM 40 MG PO TBEC
40.0000 mg | DELAYED_RELEASE_TABLET | Freq: Every day | ORAL | Status: DC
  Administered 2016-06-06: 40 mg via ORAL
  Filled 2016-06-06: qty 1

## 2016-06-06 MED ORDER — ENOXAPARIN SODIUM 40 MG/0.4ML ~~LOC~~ SOLN
40.0000 mg | SUBCUTANEOUS | Status: DC
  Administered 2016-06-06: 40 mg via SUBCUTANEOUS
  Filled 2016-06-06: qty 0.4

## 2016-06-06 MED ORDER — STROKE: EARLY STAGES OF RECOVERY BOOK
Freq: Once | Status: AC
  Administered 2016-06-06: 06:00:00

## 2016-06-06 MED ORDER — CLOPIDOGREL BISULFATE 75 MG PO TABS
75.0000 mg | ORAL_TABLET | Freq: Every day | ORAL | Status: DC
  Administered 2016-06-06: 75 mg via ORAL
  Filled 2016-06-06: qty 1

## 2016-06-06 MED ORDER — ACETAMINOPHEN 650 MG RE SUPP: 650.0000 mg | RECTAL | Status: DC | PRN

## 2016-06-06 MED ORDER — ACETAMINOPHEN 160 MG/5ML PO SOLN: 650.0000 mg | ORAL | Status: DC | PRN

## 2016-06-06 MED ORDER — ASPIRIN 300 MG RE SUPP: 300.0000 mg | Freq: Every day | RECTAL | Status: DC

## 2016-06-06 MED ORDER — HYDROCHLOROTHIAZIDE 25 MG PO TABS
25.0000 mg | ORAL_TABLET | Freq: Every day | ORAL | Status: DC
  Administered 2016-06-06: 25 mg via ORAL
  Filled 2016-06-06: qty 1

## 2016-06-06 MED ORDER — ACETAMINOPHEN 325 MG PO TABS: 650.0000 mg | ORAL_TABLET | ORAL | Status: DC | PRN

## 2016-06-06 MED ORDER — SIMVASTATIN 20 MG PO TABS: 20.0000 mg | ORAL_TABLET | Freq: Every day | ORAL | Status: DC

## 2016-06-06 MED ORDER — INSULIN ASPART 100 UNIT/ML ~~LOC~~ SOLN
0.0000 [IU] | Freq: Every day | SUBCUTANEOUS | Status: DC
  Administered 2016-06-06: 4 [IU] via SUBCUTANEOUS

## 2016-06-06 MED ORDER — SODIUM CHLORIDE 0.9 % IV SOLN
INTRAVENOUS | Status: DC
  Administered 2016-06-06: 02:00:00 via INTRAVENOUS

## 2016-06-06 MED ORDER — INSULIN ASPART 100 UNIT/ML ~~LOC~~ SOLN
0.0000 [IU] | Freq: Three times a day (TID) | SUBCUTANEOUS | Status: DC
  Administered 2016-06-06: 8 [IU] via SUBCUTANEOUS

## 2016-06-06 MED ORDER — CLOPIDOGREL BISULFATE 75 MG PO TABS: 75.0000 mg | ORAL_TABLET | Freq: Every day | ORAL | 0 refills | Status: DC

## 2016-06-06 MED ORDER — ASPIRIN 325 MG PO TABS: 325.0000 mg | ORAL_TABLET | Freq: Every day | ORAL | Status: DC

## 2016-06-06 MED ORDER — EZETIMIBE 10 MG PO TABS
10.0000 mg | ORAL_TABLET | Freq: Every day | ORAL | Status: DC
Start: 2016-06-06 — End: 2016-06-06
  Administered 2016-06-06: 10 mg via ORAL
  Filled 2016-06-06: qty 1

## 2016-06-06 NOTE — Progress Notes (Signed)
neuro

## 2016-06-06 NOTE — Care Management Note (Signed)
Case Management Note  Patient Details  Name: Katie Lozano MRN: [id number] Date of Birth: 26-Jan-1941  Subjective/Objective:                    Action/Plan: Pt discharging home with orders for [facility] services. CM met with the patient and provided her a list of [place] agencies in [place]. She selected [facility]. CM called [facility] and await return call. Will fax pt information. Pt has transportation home.   Expected Discharge Date:                  Expected Discharge Plan:  [place]  In-House Referral:     Discharge planning Services  CM Consult  Post Acute Care Choice:  Home Health Choice offered to:  Patient  DME Arranged:    DME Agency:     HH Arranged:  PT [place]:  [place]  Status of Service:  Completed, signed off  If discussed at [place] of Stay Meetings, dates discussed:    Additional Comments:  Pollie Friar, RN 06/06/2016, 5:08 PM

## 2016-06-06 NOTE — Discharge Instructions (Signed)
You have enlarged Lymph Nodes in the Neck, get them checked by your PCP.  Follow with Primary MD Hoyt Koch, MD in 7 days   Get CBC, CMP, 2 view Chest X ray checked  by Primary MD or SNF MD in 5-7 days ( we routinely change or add medications that can affect your baseline labs and fluid status, therefore we recommend that you get the mentioned basic workup next visit with your PCP, your PCP may decide not to get them or add new tests based on their clinical decision)   Activity: As tolerated with Full fall precautions use walker/cane & assistance as needed   Disposition Home     Diet:   Diet heart healthy/carb modified    For Heart failure patients - Check your Weight same time everyday, if you gain over 2 pounds, or you develop in leg swelling, experience more shortness of breath or chest pain, call your Primary MD immediately. Follow Cardiac Low Salt Diet and 1.5 lit/day fluid restriction.   On your next visit with your primary care physician please Get Medicines reviewed and adjusted.   Please request your Prim.MD to go over all Hospital Tests and Procedure/Radiological results at the follow up, please get all Hospital records sent to your Prim MD by signing hospital release before you go home.   If you experience worsening of your admission symptoms, develop shortness of breath, life threatening emergency, suicidal or homicidal thoughts you must seek medical attention immediately by calling 911 or calling your MD immediately  if symptoms less severe.  You Must read complete instructions/literature along with all the possible adverse reactions/side effects for all the Medicines you take and that have been prescribed to you. Take any new Medicines after you have completely understood and accpet all the possible adverse reactions/side effects.   Do not drive, operate heavy machinery, perform activities at heights, swimming or participation in water activities or provide baby  sitting services if your were admitted for syncope or siezures until you have seen by Primary MD or a Neurologist and advised to do so again.  Do not drive when taking Pain medications.    Do not take more than prescribed Pain, Sleep and Anxiety Medications  Special Instructions: If you have smoked or chewed Tobacco  in the last 2 yrs please stop smoking, stop any regular Alcohol  and or any Recreational drug use.  Wear Seat belts while driving.   Please note  You were cared for by a hospitalist during your hospital stay. If you have any questions about your discharge medications or the care you received while you were in the hospital after you are discharged, you can call the unit and asked to speak with the hospitalist on call if the hospitalist that took care of you is not available. Once you are discharged, your primary care physician will handle any further medical issues. Please note that NO REFILLS for any discharge medications will be authorized once you are discharged, as it is imperative that you return to your primary care physician (or establish a relationship with a primary care physician if you do not have one) for your aftercare needs so that they can reassess your need for medications and monitor your lab values.

## 2016-06-06 NOTE — Discharge Summary (Signed)
Katie Lozano [id number] DOB: [birth year] DOA: 06/05/2016  PCP: Katie Koch, MD  Admit date: 06/05/2016  Discharge date: 06/06/2016  Admitted From: Home   Disposition:  Home   Recommendations for Outpatient Follow-up:   Follow up with PCP in 1-2 weeks  PCP Please obtain BMP/CBC, 2 view CXR in 1week,  (see Discharge instructions)   PCP Please follow up on the following pending results: A1c, Incidental finding of lymphadenopathy in the neck on imaging. PCP to follow, outpatient age-appropriate workup.   Home Health: PT Equipment/Devices: None  Consultations: Neuro Discharge Condition: Stable   CODE STATUS: DNR   Diet Recommendation: Diet heart healthy/carb modified    Chief Complaint  Patient presents with  . Weakness     Brief history of present illness from the day of admission and additional interim summary    Katie Lozano is a 75 y.o. female with a past medical history significant for IDDM, HTN, CKD III and hx of TIA who presents with left arm numbness and weakness.  The patient was in her usual state of health until tonight around 5PM.  She had been out all day shopping at [employer] etc for hours, came home at [facility] and ate dinner.  Shortly after that, she had onset of vague malaise, urge to have a BM.  Went to the bathroom but couldn't go and noticed that she was "fuzzy headed" and "too weak to stand".  All of a sudden then she had total arm numbness and also clumsiness "like I couldn't turn my arm".  There was no LOC. After a few minutes she was able to walk to her bed and lay down for a few minutes, napped?, then when she woke (around [place]) called her sister that she was still feeling off.  Her sister noted at that time speech was off, "like her tongue was thick", and brought her to the  ER.  Hospital issues addressed     1. TIA - MRI/MRA brain negative, symptoms have resolved, seen by neuro, placed on Plavix, A1c is pending, continue home dose statin and Zetia or LDL was under 70, discussed with neurologist Dr. Erlinda Hong patient will also get a 30 day event monitor which has been ordered and she will follow with PCP and neurology outpatient after discharge. A1c is pending.   Carotid duplex unremarkable as well. Patient refused TTE , it was done at [facility] a few days ago, we sent request to send the report but [facility] hospital could not send the report, patient was told she refused TTE here, PCP plz follow [place].  2. Hypertension. Stable continue home dose blood pressure medicine.  3. Dyslipidemia. LDL under 70 continue statin and Zetia  4. Hypokalemia. Replaced and stable  5. GERD. On PPI  6. DM type II. A1c pending continue home regimen follow with PCP for glycemic control.  7. Incidental finding of lymphadenopathy in the neck on imaging. PCP to follow, outpatient age-appropriate workup.   Discharge diagnosis  Principal Problem:   Carotid artery syndrome Active Problems:   Type 2 diabetes mellitus with diabetic nephropathy, with long-term current use of insulin (HCC)   Essential hypertension   Hypokalemia   CKD (chronic kidney disease), stage III      Discharge instructions    Discharge Instructions    Diet - low sodium heart healthy    Complete by:  As directed    Discharge instructions    Complete by:  As directed    You have enlarged Lymph Nodes in the Neck, get them checked by your PCP.  Follow with Primary MD Katie Koch, MD in 7 days   Get CBC, CMP, 2 view Chest X ray checked  by Primary MD or SNF MD in 5-7 days ( we routinely change or add medications that can affect your baseline labs and fluid status, therefore we recommend that you get the mentioned basic workup next visit with your PCP, your PCP may decide not to get  them or add new tests based on their clinical decision)   Activity: As tolerated with Full fall precautions use walker/cane & assistance as needed   Disposition Home     Diet:   Diet heart healthy/carb modified    For Heart failure patients - Check your Weight same time everyday, if you gain over 2 pounds, or you develop in leg swelling, experience more shortness of breath or chest pain, call your Primary MD immediately. Follow Cardiac Low Salt Diet and 1.5 lit/day fluid restriction.   On your next visit with your primary care physician please Get Medicines reviewed and adjusted.   Please request your Prim.MD to go over all Hospital Tests and Procedure/Radiological results at the follow up, please get all Hospital records sent to your Prim MD by signing hospital release before you go home.   If you experience worsening of your admission symptoms, develop shortness of breath, life threatening emergency, suicidal or homicidal thoughts you must seek medical attention immediately by calling 911 or calling your MD immediately  if symptoms less severe.  You Must read complete instructions/literature along with all the possible adverse reactions/side effects for all the Medicines you take and that have been prescribed to you. Take any new Medicines after you have completely understood and accpet all the possible adverse reactions/side effects.   Do not drive, operate heavy machinery, perform activities at heights, swimming or participation in water activities or provide baby sitting services if your were admitted for syncope or siezures until you have seen by Primary MD or a Neurologist and advised to do so again.  Do not drive when taking Pain medications.    Do not take more than prescribed Pain, Sleep and Anxiety Medications  Special Instructions: If you have smoked or chewed Tobacco  in the last 2 yrs please stop smoking, stop any regular Alcohol  and or any Recreational drug use.  Wear  Seat belts while driving.   Please note  You were cared for by a hospitalist during your hospital stay. If you have any questions about your discharge medications or the care you received while you were in the hospital after you are discharged, you can call the unit and asked to speak with the hospitalist on call if the hospitalist that took care of you is not available. Once you are discharged, your primary care physician will handle any further medical issues. Please note that NO REFILLS for any discharge medications will be authorized once you are discharged, as  it is imperative that you return to your primary care physician (or establish a relationship with a primary care physician if you do not have one) for your aftercare needs so that they can reassess your need for medications and monitor your lab values.   Increase activity slowly    Complete by:  As directed       Discharge Medications     Medication List    TAKE these medications   ACCU-CHEK SMARTVIEW test strip Generic drug:  glucose blood Check blood sugar 4 times daily   Alcohol Prep Pads   CALCIUM + D3 PO Take 1 tablet by mouth daily.   clopidogrel 75 MG tablet Commonly known as:  PLAVIX Take 1 tablet (75 mg total) by mouth daily. Start taking on:  06/07/2016   D3 SUPER STRENGTH 2000 units Caps Generic drug:  Cholecalciferol Take by mouth.   HUMALOG KWIKPEN 200 UNIT/ML Sopn Generic drug:  Insulin Lispro Inject 20 Units into the skin 3 (three) times daily.   hydrochlorothiazide 25 MG tablet Commonly known as:  HYDRODIURIL Take 25 mg by mouth daily.   LANTUS 100 UNIT/ML injection Generic drug:  insulin glargine Inject 55 Units into the skin at bedtime.   lisinopril 10 MG tablet Commonly known as:  PRINIVIL,ZESTRIL Take 10 mg by mouth daily.   MULTI VITAMIN DAILY Tabs Take by mouth.   omeprazole 20 MG capsule Commonly known as:  PRILOSEC Take 20 mg by mouth daily.   simvastatin 20 MG  tablet Commonly known as:  ZOCOR Take 20 mg by mouth daily at 6 PM.   ZETIA 10 MG tablet Generic drug:  ezetimibe Take 10 mg by mouth daily.       Follow-up Information    Katie Koch, MD. Schedule an appointment as soon as possible for a visit in 1 week(s).   Specialty:  Internal Medicine Contact information: [place] [id number] [phone]        Xu,Jindong, MD. Schedule an appointment as soon as possible for a visit in 1 month(s).   Specialty:  Neurology Contact information: [address] [place] [place] [address] [phone]           Major procedures and Radiology Reports - PLEASE review detailed and final reports thoroughly  -     TTE **  Carotid US **   Dg Chest 2 View  Result Date: 06/05/2016 CLINICAL DATA:  75 y/o F; dyspnea, left arm numbness, and abdominal pain today. EXAM: CHEST  2 VIEW COMPARISON:  None. FINDINGS: Normal cardiac silhouette. Clear lungs. Minimal reverse S curvature of the thoracic spine. Cholecystectomy clips. No acute osseous abnormality is identified. No pleural effusion. IMPRESSION: No active cardiopulmonary disease. Electronically Signed   By: Kristine Garbe M.D.   On: 06/05/2016 21:35   Ct Head Wo Contrast  Result Date: 06/05/2016 CLINICAL DATA:  LEFT arm numbness and tingling today. Assess for transient ischemic attack. History of hyperlipidemia, diabetes. EXAM: CT HEAD WITHOUT CONTRAST TECHNIQUE: Contiguous axial images were obtained from the base of the skull through the vertex without intravenous contrast. COMPARISON:  None. FINDINGS: BRAIN: The ventricles and sulci are normal for age. No intraparenchymal hemorrhage, mass effect nor midline shift. Patchy supratentorial white matter hypodensities less than expected for patient's age, though non-specific are most compatible with chronic small vessel ischemic disease. No acute large vascular territory infarcts. No abnormal  extra-axial fluid collections. Basal cisterns are patent. VASCULAR: Moderate calcific atherosclerosis of the carotid siphons.  SKULL: No skull fracture. Severe LEFT temporomandibular osteoarthrosis. No significant scalp soft tissue swelling. SINUSES/ORBITS: Soft tissue opacifies the RIGHT maxillary sinus with extensive mucoperiosteal reaction. Mild RIGHT fronto ethmoidal mucosal thickening. The included ocular globes and orbital contents are non-suspicious. OTHER: None. IMPRESSION: Negative CT HEAD for age. Chronic RIGHT paranasal sinusitis. Electronically Signed   By: Elon Alas M.D.   On: 06/05/2016 22:03   Mr Brain Wo Contrast  Result Date: 06/06/2016 CLINICAL DATA:  Fatigue, vision changes, LEFT arm numbness, slurred speech today. Similar episode around Thanksgiving. Possible TIA. History of diabetes, hypertension. EXAM: MRI HEAD WITHOUT CONTRAST MRA HEAD WITHOUT CONTRAST TECHNIQUE: Multiplanar, multiecho pulse sequences of the brain and surrounding structures were obtained without intravenous contrast. Angiographic images of the head were obtained using MRA technique without contrast. COMPARISON:  CT HEAD June 05, 2016 FINDINGS: MRI HEAD FINDINGS BRAIN: No reduced diffusion to suggest acute ischemia. No susceptibility artifact to suggest hemorrhage. The ventricles and sulci are normal for patient's age. Scattered subcentimeter supratentorial white matter FLAIR T2 hyperintensities compatible with mild to moderate chronic small vessel ischemic disease No suspicious parenchymal signal, masses or mass effect. No abnormal extra-axial fluid collections. No extra-axial masses though, contrast enhanced sequences would be more sensitive. VASCULAR: Normal major intracranial vascular flow voids present at skull base. SKULL AND UPPER CERVICAL SPINE: No abnormal sellar expansion. C2-3 segmentation anomaly suspected. No suspicious calvarial bone marrow signal. Craniocervical junction maintained. SINUSES/ORBITS:  Severe chronic RIGHT maxillary sinusitis, RIGHT ethmoid sinusitis with mild RIGHT frontal sinus mucosal thickening. Mastoid air cells are well aerated. The included ocular globes and orbital contents are non-suspicious. OTHER: Mild RIGHT neck lymphadenopathy suspected. MRA HEAD FINDINGS ANTERIOR CIRCULATION: Normal flow related enhancement of the included cervical, petrous, cavernous and supraclinoid internal carotid arteries. Patent anterior communicating artery. Normal flow related enhancement of the anterior and middle cerebral arteries, including distal segments. No large vessel occlusion, high-grade stenosis, abnormal luminal irregularity, aneurysm. POSTERIOR CIRCULATION: Codominant vertebral artery's. Basilar artery is patent, with normal flow related enhancement of the main branch vessels. Normal flow related enhancement of the posterior cerebral arteries. Robust RIGHT posterior communicating artery present. No large vessel occlusion, high-grade stenosis, abnormal luminal irregularity, aneurysm. IMPRESSION: MRI HEAD: No acute intracranial process, specifically no acute ischemia. Involutional changes and mild to moderate chronic small vessel ischemic disease. Suspected RIGHT neck lymphadenopathy, or recommend correlation with physical examination. MRA HEAD: Negative. Electronically Signed   By: Elon Alas M.D.   On: 06/06/2016 02:43   Mr Jodene Nam Head/brain [id number] Cm  Result Date: 06/06/2016 CLINICAL DATA:  Fatigue, vision changes, LEFT arm numbness, slurred speech today. Similar episode around Thanksgiving. Possible TIA. History of diabetes, hypertension. EXAM: MRI HEAD WITHOUT CONTRAST MRA HEAD WITHOUT CONTRAST TECHNIQUE: Multiplanar, multiecho pulse sequences of the brain and surrounding structures were obtained without intravenous contrast. Angiographic images of the head were obtained using MRA technique without contrast. COMPARISON:  CT HEAD June 05, 2016 FINDINGS: MRI HEAD FINDINGS BRAIN: No  reduced diffusion to suggest acute ischemia. No susceptibility artifact to suggest hemorrhage. The ventricles and sulci are normal for patient's age. Scattered subcentimeter supratentorial white matter FLAIR T2 hyperintensities compatible with mild to moderate chronic small vessel ischemic disease No suspicious parenchymal signal, masses or mass effect. No abnormal extra-axial fluid collections. No extra-axial masses though, contrast enhanced sequences would be more sensitive. VASCULAR: Normal major intracranial vascular flow voids present at skull base. SKULL AND UPPER CERVICAL SPINE: No abnormal sellar expansion. C2-3 segmentation anomaly suspected. No suspicious calvarial bone marrow  signal. Craniocervical junction maintained. SINUSES/ORBITS: Severe chronic RIGHT maxillary sinusitis, RIGHT ethmoid sinusitis with mild RIGHT frontal sinus mucosal thickening. Mastoid air cells are well aerated. The included ocular globes and orbital contents are non-suspicious. OTHER: Mild RIGHT neck lymphadenopathy suspected. MRA HEAD FINDINGS ANTERIOR CIRCULATION: Normal flow related enhancement of the included cervical, petrous, cavernous and supraclinoid internal carotid arteries. Patent anterior communicating artery. Normal flow related enhancement of the anterior and middle cerebral arteries, including distal segments. No large vessel occlusion, high-grade stenosis, abnormal luminal irregularity, aneurysm. POSTERIOR CIRCULATION: Codominant vertebral artery's. Basilar artery is patent, with normal flow related enhancement of the main branch vessels. Normal flow related enhancement of the posterior cerebral arteries. Robust RIGHT posterior communicating artery present. No large vessel occlusion, high-grade stenosis, abnormal luminal irregularity, aneurysm. IMPRESSION: MRI HEAD: No acute intracranial process, specifically no acute ischemia. Involutional changes and mild to moderate chronic small vessel ischemic disease. Suspected  RIGHT neck lymphadenopathy, or recommend correlation with physical examination. MRA HEAD: Negative. Electronically Signed   By: Elon Alas M.D.   On: 06/06/2016 02:43    Micro Results     No results found for this or any previous visit (from the past 240 hour(s)).  Today   Subjective    Katie Lozano today has no headache,no chest abdominal pain,no new weakness tingling or numbness, feels much better wants to go home today.     Objective   Blood pressure (!) 142/55, pulse 81, temperature 98.4 F (36.9 C), temperature source Oral, resp. rate 18, weight 68.7 kg (151 lb 7.3 oz), SpO2 93 %.   Intake/Output Summary (Last 24 hours) at 06/06/16 1331 Last data filed at 06/06/16 0700  Gross per 24 hour  Intake              500 ml  Output                0 ml  Net              500 ml    Exam Awake Alert, Oriented x 3, No new F.N deficits, Normal affect [place].AT,PERRAL Supple Neck,No JVD, No cervical lymphadenopathy appriciated.  Symmetrical Chest wall movement, Good air movement bilaterally, CTAB RRR,No Gallops,Rubs or new Murmurs, No Parasternal Heave +ve B.Sounds, Abd Soft, Non tender, No organomegaly appriciated, No rebound -guarding or rigidity. No Cyanosis, Clubbing or edema, No new Rash or bruise   Data Review   CBC w Diff: Lab Results  Component Value Date   WBC 11.5 (H) 06/05/2016   HGB 14.7 06/05/2016   HCT 42.8 06/05/2016   PLT 267 06/05/2016    CMP: Lab Results  Component Value Date   NA 138 06/05/2016   K 4.2 06/06/2016   CL 103 06/05/2016   CO2 24 06/05/2016   BUN 27 (H) 06/05/2016   CREATININE 1.36 (H) 06/05/2016  . No results found for: HGBA1C Lipid Panel     Component Value Date/Time   CHOL 161 06/05/2016 2338   TRIG 350 (H) 06/05/2016 2338   HDL 43 06/05/2016 2338   CHOLHDL 3.7 06/05/2016 2338   VLDL 70 (H) 06/05/2016 2338   LDLCALC 48 06/05/2016 2338    Total Time in preparing paper work, data evaluation and todays exam - 35  minutes  Lala Lund K M.D on 06/06/2016 at 1:31 PM  [facility]   Office  [phone]

## 2016-06-06 NOTE — Plan of Care (Signed)
Problem: Education: Goal: Knowledge of [facility] Education information/materials will improve Outcome: Progressing POC reviewed with pt.   

## 2016-06-06 NOTE — Progress Notes (Signed)
STROKE TEAM PROGRESS NOTE   HISTORY OF PRESENT ILLNESS (per record) Katie Lozano is an 75 y.o. female who presents for evaluation of transient left arm weakness and slurred speech. Symptoms started today and had resolved by the time EMS arrived. Had a TIA in March with transient aphasia, evaluated with carotid ultrasound which was normal per report, but no other testing. Approximately 2 days ago she had an echocardiogram performed at [facility] with results not currently available - records are being obtained.   CT in the ED revealed no acute findings. Mild diffuse cerebral atrophy and chronic small vessel ischemic changes are noted.   Home medications include ASA 81 mg qd and simvastatin 20 mg.    SUBJECTIVE (INTERVAL HISTORY) Her friend is at the bedside.  Overall she feels her condition is completely resolved. She stated that she had episode in 08/2015 with aphasia. This time two episode of slurry speech lasting 4min and 76min each. Now resolve. MRI no acute stroke. Denies heart palpitation.    OBJECTIVE Temp:  [97.5 F (36.4 C)-98.4 F (36.9 C)] 98.4 F (36.9 C) (12/08 0612) Pulse Rate:  [81-95] 81 (12/08 0612) Cardiac Rhythm: Normal sinus rhythm (12/08 0721) Resp:  [14-19] 18 (12/08 0612) BP: (105-160)/(46-110) 142/55 (12/08 0612) SpO2:  [92 %-98 %] 93 % (12/08 0612) Weight:  [68.7 kg (151 lb 7.3 oz)] 68.7 kg (151 lb 7.3 oz) (12/08 0045)  CBC:   Recent Labs Lab 06/05/16 1919  WBC 11.5*  HGB 14.7  HCT 42.8  MCV 97.3  PLT 99991111    Basic Metabolic Panel:   Recent Labs Lab 06/05/16 1919 06/05/16 2338 06/06/16 0658  NA 138  --   --   K 3.3*  --  4.2  CL 103  --   --   CO2 24  --   --   GLUCOSE 90  --   --   BUN 27*  --   --   CREATININE 1.36*  --   --   CALCIUM 10.1  --   --   MG  --  2.2  --     Lipid Panel:     Component Value Date/Time   CHOL 161 06/05/2016 2338   TRIG 350 (H) 06/05/2016 2338   HDL 43 06/05/2016 2338   CHOLHDL 3.7 06/05/2016 2338   VLDL 70  (H) 06/05/2016 2338   LDLCALC 48 06/05/2016 2338   HgbA1c: No results found for: HGBA1C Urine Drug Screen: No results found for: LABOPIA, COCAINSCRNUR, LABBENZ, AMPHETMU, THCU, LABBARB    IMAGING I have personally reviewed the radiological images below and agree with the radiology interpretations.  Dg Chest 2 View 06/05/2016 No active cardiopulmonary disease.   Ct Head Wo Contrast 06/05/2016 Negative CT HEAD for age. Chronic RIGHT paranasal sinusitis.   Mr Jodene Nam Head/brain Wo Cm 06/06/2016 MRI HEAD:  No acute intracranial process, specifically no acute ischemia. Involutional changes and mild to moderate chronic small vessel ischemic disease. Suspected RIGHT neck lymphadenopathy, or recommend correlation with physical examination.   MRA HEAD:  Negative.   CUS - Bilateral 1-39% ICA stenosis, antegrade vertebral flow bilateral.  Difficult evaluation of left proximal internal carotid artery due to acoustic shadowing.   PHYSICAL EXAM HEENT-  Normocephalic/atraumatic.  Lungs- No gross wheezes. Respirations unlabored.  Extremities- Warm and well-perfused.  Neurological Examination Mental Status:  Alert, oriented, thought content appropriate.  Speech fluent without evidence of aphasia.  Able to follow all commands without difficulty. Cranial Nerves: II: Visual fields intact to  confrontation, PERRL III,IV, VI: ptosis not present, EOMI without nystagmus V,VII: smile symmetric, facial temperature sensation normal bilaterally VIII: hearing intact to conversation IX,X: no hypophonia XI: symmetric XII: midline tongue extension Motor: Right :  Upper extremity   5/5                                      Left:     Upper extremity   5/5             Lower extremity   5/5                                                  Lower extremity   5/5 Tone and bulk:normal tone throughout; no atrophy noted Sensory: Temperature and light touch intact x 4 without extinction Deep Tendon Reflexes: Trace  patellar reflexes, 1+ achilles reflexes, 2+ biceps and brachioradialis bilaterally.  Plantars: Right: downgoing                                Left: downgoing Cerebellar: Mild action tremor bilaterally with FNF. No ataxia noted.  Gait: Deferred    ASSESSMENT/PLAN [name] is a 75 y.o. female with history of TIAs, hyperlipidemia, and diabetes mellitus, presenting with transient left arm weakness and slurred speech. She did not receive IV t-PA due to resolution of deficits.  TIA - right brain TIA suspected this time. However pt did have an aphasia episode in 08/2015. Taken all together, the TIA suspicious for embolic process.    Resultant - resolution of deficits.  MRI - No acute intracranial process  MRA - negative  Carotid Doppler - Bilateral 1-39% ICA stenosis, antegrade vertebral flow bilateral.   2D Echo - pending result from OSH  30 day cardiac event monitoring recommended to rule out afib.   LDL - 48  HgbA1c pending  VTE prophylaxis - Lovenox Diet heart healthy/carb modified Room service appropriate? Yes; Fluid consistency: Thin Diet - low sodium heart healthy  aspirin 81 mg daily prior to admission, now on clopidogrel 75 mg daily. Continue plavix on discharge.  Patient counseled to be compliant with her antithrombotic medications  Ongoing aggressive stroke risk factor management  Therapy recommendations: no needed.  Disposition: Pending  Hypertension  Stable  Permissive hypertension (OK if < 220/120) but gradually normalize in 5-7 days  Long-term BP goal normotensive  Hyperlipidemia  Home meds: Zocor 20 mg daily and Zetia 10 mg daily resumed in hospital  LDL 48, goal < 70  Continue at discharge  Diabetes  HgbA1c pending, goal < 7.0  Controlled  Other Stroke Risk Factors  Advanced age  The patient quit smoking 3 years ago.  Hx of TIA   Other Active Problems  Mild leukocytosis - 11.5 (afebrile)  Hypokalemia -  corrected  Suspected RIGHT neck lymphadenopathy on MRI  Hospital day # 0  Neurology will sign off. Please call with questions. Pt will follow up with Dr. Erlinda Hong at [facility] in about 6 weeks. Thanks for the consult.  Rosalin Hawking, MD PhD Stroke Neurology 06/06/2016 10:17 PM    To contact Stroke Continuity provider, please refer to [email or web address] After hours, contact General Neurology

## 2016-06-06 NOTE — Evaluation (Signed)
Physical Therapy Evaluation Patient Details Name: Katie Lozano MRN: [id number] DOB: 11-Jan-1941 Today's Date: 06/06/2016   History of Present Illness  75 yo female admitted with onset of generalized weakness, seeing stars in vision, L UE numbness, and slurred speech. CT (-) MRI (-) workup underway pta to rule out heart attack per chart  Clinical Impression  Pt admitted with above diagnosis. Pt currently with functional limitations due to the deficits listed below (see PT Problem List). At the time of PT eval pt was able to perform transfers and ambulation with gross modified independence, and supervision for safety during gait training. Pt will benefit from skilled PT to increase their independence and safety with mobility to allow discharge to the venue listed below.       Follow Up Recommendations Outpatient PT;Supervision for mobility/OOB    Equipment Recommendations  None recommended by PT    Recommendations for Other Services       Precautions / Restrictions Precautions Precautions: None Restrictions Weight Bearing Restrictions: No      Mobility  Bed Mobility               General bed mobility comments: pt received walking in hall with OT  Transfers Overall transfer level: Modified independent Equipment used: None             General transfer comment: incr time and effort to power up  Ambulation/Gait Ambulation/Gait assistance: Supervision Ambulation Distance (Feet): 200 Feet Assistive device: None Gait Pattern/deviations: Step-through pattern;Decreased stride length;Drifts right/left Gait velocity: Decreased Gait velocity interpretation: Below normal speed for age/gender General Gait Details: No gross LOB or unsteadiness noted. Pt was able to ambulate well in the hall without an AD. Light supervision provided for safety.   Stairs            Wheelchair Mobility    Modified Rankin (Stroke Patients Only) Modified Rankin (Stroke Patients  Only) Pre-Morbid Rankin Score: No symptoms Modified Rankin: No significant disability     Balance Overall balance assessment: No apparent balance deficits (not formally assessed)                                           Pertinent Vitals/Pain Pain Assessment: No/denies pain    Home Living Family/patient expects to be discharged to:: Private residence Living Arrangements: Alone Available Help at Discharge: Family Type of Home: House Home Access: Level entry     Home Layout: One level Home Equipment: Grab bars - tub/shower;Hand held shower head      Prior Function Level of Independence: Independent               Hand Dominance   Dominant Hand: Right    Extremity/Trunk Assessment   Upper Extremity Assessment: Defer to OT evaluation           Lower Extremity Assessment: Overall WFL for tasks assessed      Cervical / Trunk Assessment: Normal  Communication   Communication: No difficulties  Cognition Arousal/Alertness: Awake/alert Behavior During Therapy: WFL for tasks assessed/performed Overall Cognitive Status: Within Functional Limits for tasks assessed                      General Comments      Exercises     Assessment/Plan    PT Assessment Patient needs continued PT services  PT Problem List Decreased strength;Decreased range of motion;Decreased  activity tolerance;Decreased balance;Decreased mobility;Decreased knowledge of use of DME;Decreased safety awareness;Decreased knowledge of precautions;Pain          PT Treatment Interventions DME instruction;Gait training;Stair training;Functional mobility training;Therapeutic activities;Therapeutic exercise;Neuromuscular re-education;Patient/family education    PT Goals (Current goals can be found in the Care Plan section)  Acute Rehab PT Goals Patient Stated Goal: Home at d/c PT Goal Formulation: With patient Time For Goal Achievement: 06/13/16 Potential to Achieve  Goals: Good    Frequency Min 3X/week   Barriers to discharge        Co-evaluation               End of Session Equipment Utilized During Treatment: Gait belt Activity Tolerance: Patient tolerated treatment well Patient left: in chair;with call bell/phone within reach Nurse Communication: Mobility status    Functional Assessment Tool Used: Clinical judgement Functional Limitation: Mobility: Walking and moving around Mobility: Walking and Moving Around Current Status [id number]): At least 1 percent but less than 20 percent impaired, limited or restricted Mobility: Walking and Moving Around Goal Status [phone]): At least 1 percent but less than 20 percent impaired, limited or restricted    Time: 1050-1120 PT Time Calculation (min) (ACUTE ONLY): 30 min   Charges:   PT Evaluation $PT Eval Moderate Complexity: 1 Procedure PT Treatments $Gait Training: 8-22 mins   PT G Codes:   PT G-Codes **NOT FOR INPATIENT CLASS** Functional Assessment Tool Used: Clinical judgement Functional Limitation: Mobility: Walking and moving around Mobility: Walking and Moving Around Current Status [id number]): At least 1 percent but less than 20 percent impaired, limited or restricted Mobility: Walking and Moving Around Goal Status [phone]): At least 1 percent but less than 20 percent impaired, limited or restricted    Thelma Comp 06/06/2016, 2:23 PM   Rolinda Roan, PT, DPT Acute Rehabilitation Services Pager: [phone]

## 2016-06-06 NOTE — Progress Notes (Signed)
Request for records consent signed and to be send to [facility]. Katie Lozano

## 2016-06-06 NOTE — H&P (Signed)
History and Physical  Patient Name: Katie Lozano     [id number]    DOB: 1941/03/12    DOA: 06/05/2016 PCP: Hoyt Koch, MD   Patient coming from: Home     Chief Complaint: Transient left arm numbness and weakness  HPI: Katie Lozano is a 75 y.o. female with a past medical history significant for IDDM, HTN, CKD III and hx of TIA who presents with left arm numbness and weakness.  The patient was in her usual state of health until tonight around 5PM.  She had been out all day shopping at [employer] etc for hours, came home at [facility] and ate dinner.  Shortly after that, she had onset of vague malaise, urge to have a BM.  Went to the bathroom but couldn't go and noticed that she was "fuzzy headed" and "too weak to stand".  All of a sudden then she had total arm numbness and also clumsiness "like I couldn't turn my arm".  There was no LOC. After a few minutes she was able to walk to her bed and lay down for a few minutes, napped?, then when she woke (around [place]) called her sister that she was still feeling off.  Her sister noted at that time speech was off, "like her tongue was thick", and brought her to the ER.  ED course: -Afebrile, heart rate 95, respirations and pulse ox normal, BP 133/75 -Na 138, K 3.3, Cr 1.36 (baseline unknown), WBC 11.5K, Hgb 14.7 -Troponin negative -CT head unremarkable for age -ECG showed rate 95, LBBB -TRH were asked to evaluate for possible TIA      Of note, the patient had an episode on Thanksgiving that she connects to this one.  After the meal, she all of a sudden felt unwell, was "seeing stars", was noted to be very pale.  A family member checked her BP and HR which were both low, and called 9-1-1.  EMS found her also to have low BP and HR, blood sugar normal.  They advised transport but she refused.  She saw a new PCP this past week who ordered an ECG that showed an incomplete LBBB and referred her stat to Cardiology, who were unimpressed  with the ECG and ordered an echo (done two days ago at [facility], report currently unavailable) and NM perfusion study (due next week).  In March of this year, she had a brief TIA.  This was pure aphasia, resolved in about 15 minutes.  Afterwards she saw her endocrinologist who ordered a carotid US that was normal.       Review of systems:  Review of Systems  Gastrointestinal: Positive for abdominal pain. Negative for blood in stool, constipation, diarrhea, melena, nausea and vomiting.  Neurological: Positive for dizziness, sensory change and focal weakness. Negative for loss of consciousness.         Past Medical History:  Diagnosis Date  . Allergy   . Colon polyp   . Diabetes mellitus without complication ([place])   . Diverticulitis   . GERD (gastroesophageal reflux disease)   . Hyperlipidemia     Past Surgical History:  Procedure Laterality Date  . APPENDECTOMY  1956  . CHOLECYSTECTOMY  1997  . TONSILLECTOMY     age 25    Social History: Patient lives alone.  Patient walks unassisted.  She is a former smoker.    No Known Allergies  Family history: family history includes Colon cancer in her maternal uncle; Colon polyps in her  maternal uncle; Diabetes in her father; Heart disease in her father and mother; Melanoma in her mother.  Prior to Admission medications   Medication Sig Start Date End Date Taking? Authorizing Provider  Calcium Carb-Cholecalciferol (CALCIUM + D3 PO) Take 1 tablet by mouth daily.    Yes Historical Provider, MD  Cholecalciferol (D3 SUPER STRENGTH) 2000 UNITS CAPS Take by mouth.   Yes Historical Provider, MD  HUMALOG KWIKPEN 200 UNIT/ML SOPN Inject 20 Units into the skin 3 (three) times daily.  02/21/15  Yes Historical Provider, MD  hydrochlorothiazide (HYDRODIURIL) 25 MG tablet Take 25 mg by mouth daily.   Yes Historical Provider, MD  LANTUS 100 UNIT/ML injection Inject 55 Units into the skin at bedtime.  03/09/15  Yes Historical Provider, MD  lisinopril  (PRINIVIL,ZESTRIL) 10 MG tablet Take 10 mg by mouth daily.   Yes Historical Provider, MD  Multiple Vitamin (MULTI VITAMIN DAILY) TABS Take by mouth.   Yes Historical Provider, MD  simvastatin (ZOCOR) 20 MG tablet Take 20 mg by mouth daily at 6 PM.  02/24/15  Yes Historical Provider, MD  ZETIA 10 MG tablet Take 10 mg by mouth daily.  03/28/15  Yes Historical Provider, MD  ACCU-CHEK SMARTVIEW test strip Check blood sugar 4 times daily 03/28/15   Historical Provider, MD  Alcohol Swabs (ALCOHOL PREP) PADS  03/13/15   Historical Provider, MD     Physical Exam: BP 130/60   Pulse 81   Temp 97.6 F (36.4 C)   Resp 17   SpO2 94%  General appearance: Well-developed, obese adult female, alert and in no acut distress.   Eyes: Anicteric, conjunctiva pink, lids and lashes normal. PERRL.    ENT: No nasal deformity, discharge, epistaxis.  Hearing normal. OP moist without lesions.   Dentition normal. Lymph: No cervical, supraclavicular or axillary lymphadenopathy. Skin: Warm and dry.  No jaundice.  No suspicious rashes or lesions. Cardiac: RRR, nl S1-S2, no murmurs appreciated.  Capillary refill is brisk.  JVP not visible.  No LE edema.  Radial and DP pulses 2+ and symmetric.  No carotid bruits. Respiratory: Normal respiratory rate and rhythm.  CTAB without rales or wheezes. GI: Abdomen soft without rigidity.  No TTP. No ascites, distension, no hepatosplenomegaly.   MSK: No deformities or effusions. Neuro: Pupils are 4 mm and reactive to 3 mm. Extraocular movements are intact, without nystagmus. Cranial nerve 5 is within normal limits. Cranial nerve 7 is symmetrical. Cranial nerve 8 is within normal limits. Cranial nerves 9 and 10 reveal equal palate elevation. Cranial nerve 11 reveals sternocleidomastoid strong. Cranial nerve 12 is midline. I do not note a deficit in motor strength testing in the upper and lower extremities bilaterally with normal motor, tone and bulk. Romberg maneuver is negative for  pathology. Finger-to-nose testing is within normal limits. Speech is fluent. Naming is grossly intact. Attention span and concentration are within normal limits.  Sensation intact to light touch, cold, pin prick. Psych: The patient is oriented to time, place and person. Behavior appropriate.  Affect normal.  Recall, recent and remote, as well as general fund of knowledge seem within normal limits. No evidence of aural or visual hallucinations or delusions.       Labs on Admission:  I have personally reviewed following labs and imaging studies: CBC:  Recent Labs Lab 06/05/16 1919  WBC 11.5*  HGB 14.7  HCT 42.8  MCV 97.3  PLT [id number]   Basic Metabolic Panel:  Recent Labs Lab 06/05/16 1919  NA  138  K 3.3*  CL 103  CO2 24  GLUCOSE 90  BUN 27*  CREATININE 1.36*  CALCIUM 10.1   GFR: CrCl cannot be calculated (Unknown ideal weight.). Liver Function Tests: No results for input(s): AST, ALT, ALKPHOS, BILITOT, PROT, ALBUMIN in the last 168 hours. No results for input(s): LIPASE, AMYLASE in the last 168 hours. No results for input(s): AMMONIA in the last 168 hours. Coagulation Profile: No results for input(s): INR, PROTIME in the last 168 hours. Cardiac Enzymes:  Recent Labs Lab 06/05/16 1919  TROPONINI <0.03   BNP (last 3 results) No results for input(s): PROBNP in the last 8760 hours. HbA1C: No results for input(s): HGBA1C in the last 72 hours. CBG:  Recent Labs Lab 06/05/16 2300  GLUCAP 272*   Lipid Profile: No results for input(s): CHOL, HDL, LDLCALC, TRIG, CHOLHDL, LDLDIRECT in the last 72 hours. Thyroid Function Tests: No results for input(s): TSH, T4TOTAL, FREET4, T3FREE, THYROIDAB in the last 72 hours. Anemia Panel: No results for input(s): VITAMINB12, FOLATE, FERRITIN, TIBC, IRON, RETICCTPCT in the last 72 hours. Sepsis Labs: Invalid input(s): PROCALCITONIN, LACTICIDVEN No results found for this or any previous visit (from the past 240 hour(s)).     Radiological Exams on Admission: Personally reviewed CXR showed no focal opacity, CT head report reviewed, nonfocal: Dg Chest 2 View  Result Date: 06/05/2016 CLINICAL DATA:  75 y/o F; dyspnea, left arm numbness, and abdominal pain today. EXAM: CHEST  2 VIEW COMPARISON:  None. FINDINGS: Normal cardiac silhouette. Clear lungs. Minimal reverse S curvature of the thoracic spine. Cholecystectomy clips. No acute osseous abnormality is identified. No pleural effusion. IMPRESSION: No active cardiopulmonary disease. Electronically Signed   By: Kristine Garbe M.D.   On: 06/05/2016 21:35   Ct Head Wo Contrast  Result Date: 06/05/2016 CLINICAL DATA:  LEFT arm numbness and tingling today. Assess for transient ischemic attack. History of hyperlipidemia, diabetes. EXAM: CT HEAD WITHOUT CONTRAST TECHNIQUE: Contiguous axial images were obtained from the base of the skull through the vertex without intravenous contrast. COMPARISON:  None. FINDINGS: BRAIN: The ventricles and sulci are normal for age. No intraparenchymal hemorrhage, mass effect nor midline shift. Patchy supratentorial white matter hypodensities less than expected for patient's age, though non-specific are most compatible with chronic small vessel ischemic disease. No acute large vascular territory infarcts. No abnormal extra-axial fluid collections. Basal cisterns are patent. VASCULAR: Moderate calcific atherosclerosis of the carotid siphons. SKULL: No skull fracture. Severe LEFT temporomandibular osteoarthrosis. No significant scalp soft tissue swelling. SINUSES/ORBITS: Soft tissue opacifies the RIGHT maxillary sinus with extensive mucoperiosteal reaction. Mild RIGHT fronto ethmoidal mucosal thickening. The included ocular globes and orbital contents are non-suspicious. OTHER: None. IMPRESSION: Negative CT HEAD for age. Chronic RIGHT paranasal sinusitis. Electronically Signed   By: Elon Alas M.D.   On: 06/05/2016 22:03     EKG:  Independently reviewed. Rate 95, LBBB.  Old ECG from last week obtained [facility], shows same, LBBB, QRS same as present (122 ms then, 126 now).  Echocardiogram report obtained from [facility] from 12/6, in chart: EF 65-70% No significant valvular disease      Assessment/Plan  1. Acute TIA:  This is new.  ABCD  -Admit to telemetry -Neuro checks, NIHSS per protocol -Daily aspirin 325 mg -Lipids, hemoglobin A1c -MRI ordered -Echo normal 2 days ago -Carotids deferred to Neurology, given reportedly normal in January -PT/OT/SLP consultation -Consult to Neurology, appreciate recommendations   2. IDDM:  -Continue glargine -SSI with meals  3. HTN:  -  Continue lisinopril, HCTZ -Continue statin, Zetia  4. Elevated creatinine:  Unclear chronicity, presumed CKD. -Repeat BMP tomorrow after fluids  5. Hypokalemia:  -Repleted orally -Check mag  6. Other medications:  -Continue PPI      DVT prophylaxis: Lovenox  Code Status: DO NOT RESUSCITATE  Family Communication: Sister at bedside  Disposition Plan: Anticipate Stroke work up as above and consult to ancillary services.  Expect discharge within 1 day. Consults called: Neurology, Dr. Cheral Marker has seen patient. Admission status: Telemetry, OBS status  Core measures: -VTE prophylaxis ordered at time of admission -Aspirin ordered at admission -Atrial fibrillation: not present at admission -tPA not given because of symptoms resolved -Dysphagia screen ordered in ER -Lipids ordered -PT eval ordered     Medical decision making: Patient seen at 11:20 PM on 06/05/2016.  The patient was discussed with Dr. Laverta Baltimore and Dr. Cheral Marker. What exists of the patient's chart was reviewed in depth and outside records were requested and reviewed and summarized above.  Clinical condition: stable.       Edwin Dada [facility] Pager [phone]

## 2016-06-06 NOTE — Consult Note (Signed)
NEURO HOSPITALIST CONSULT NOTE   Requestig physician: Dr. Loleta Books  Reason for Consult: TIA  History obtained from:  Patient     HPI:                                                                                                                                          Katie Lozano is an 75 y.o. female who presents for evaluation of transient left arm weakness and slurred speech. Symptoms started today and had resolved by the time EMS arrived. Had a TIA in March with transient aphasia, evaluated with carotid ultrasound which was normal per report, but no other testing. Approximately 2 days ago she had an echocardiogram performed at [facility] with results not currently available - records are being obtained.   CT in the ED revealed no acute findings. Mild diffuse cerebral atrophy and chronic small vessel ischemic changes are noted.   Home medications include ASA 81 mg qd and simvastatin 20 mg.   Past Medical History:  Diagnosis Date  . Allergy   . Colon polyp   . Diabetes mellitus without complication ([place])   . Diverticulitis   . GERD (gastroesophageal reflux disease)   . Hyperlipidemia     Past Surgical History:  Procedure Laterality Date  . APPENDECTOMY  1956  . CHOLECYSTECTOMY  1997  . TONSILLECTOMY     age 23    Family History  Problem Relation Age of Onset  . Heart disease Mother   . Melanoma Mother   . Heart disease Father     MI  . Diabetes Father   . Colon cancer Maternal Uncle   . Colon polyps Maternal Uncle    Social History:  reports that she quit smoking about 3 years ago. Her smoking use included Cigarettes. She has never used smokeless tobacco. She reports that she does not drink alcohol or use drugs.  No Known Allergies  MEDICATIONS:                                                                                                                     aspirin 81 MG chewable tablet Chew 81 mg by mouth daily. Historical Provider, MD Needs  Review  Calcium Carb-Cholecalciferol (CALCIUM + D3 PO) Take 1 tablet by mouth daily.  Historical Provider, MD Needs Review  Cholecalciferol (D3 SUPER STRENGTH) 2000 UNITS CAPS Take by mouth. Historical Provider, MD Needs Review  HUMALOG KWIKPEN 200 UNIT/ML SOPN Inject 20 Units into the skin 3 (three) times daily.  Historical Provider, MD Needs Review  hydrochlorothiazide (HYDRODIURIL) 25 MG tablet Take 25 mg by mouth daily. Historical Provider, MD Needs Review  LANTUS 100 UNIT/ML injection Inject 55 Units into the skin at bedtime.  Historical Provider, MD Needs Review  lisinopril (PRINIVIL,ZESTRIL) 10 MG tablet Take 10 mg by mouth daily. Historical Provider, MD Needs Review  Multiple Vitamin (MULTI VITAMIN DAILY) TABS Take by mouth. Historical Provider, MD Needs Review  omeprazole (PRILOSEC) 20 MG capsule Take 20 mg by mouth daily. Historical Provider, MD Needs Review  simvastatin (ZOCOR) 20 MG tablet Take 20 mg by mouth daily at 6 PM.  Historical Provider, MD Needs Review  ZETIA 10 MG tablet Take 10 mg by mouth daily.  Historical Provider, MD Needs Review  ACCU-CHEK SMARTVIEW test strip Check blood sugar 4 times daily Historical Provider, MD Needs Review  Alcohol Swabs (ALCOHOL PREP) PADS       ROS:                                                                                                                                       History obtained from patient. States that she had vague abdominal discomfort after eating, which preceded onset of the TIA symptoms. No nausea or vomiting. Does not endorse vision changes.    Blood pressure (!) 122/110, pulse 87, temperature 97.6 F (36.4 C), resp. rate 14, SpO2 93 %.   General Examination:                                                                                                      HEENT-  Normocephalic/atraumatic.  Lungs- No gross wheezes. Respirations unlabored.  Extremities- Warm and well-perfused.  Neurological Examination Mental  Status:  Alert, oriented, thought content appropriate.  Speech fluent without evidence of aphasia.  Able to follow all commands without difficulty. Cranial Nerves: II: Visual fields intact to confrontation, PERRL III,IV, VI: ptosis not present, EOMI without nystagmus V,VII: smile symmetric, facial temperature sensation normal bilaterally VIII: hearing intact to conversation IX,X: no hypophonia XI: symmetric XII: midline tongue extension Motor: Right : Upper extremity   5/5    Left:     Upper extremity   5/5  Lower extremity   5/5     Lower extremity   5/5  Tone and bulk:normal tone throughout; no atrophy noted Sensory: Temperature and light touch intact x 4 without extinction Deep Tendon Reflexes: Trace patellar reflexes, 1+ achilles reflexes, 2+ biceps and brachioradialis bilaterally.  Plantars: Right: downgoing   Left: downgoing Cerebellar: Mild action tremor bilaterally with FNF. No ataxia noted.  Gait: Deferred   Lab Results: Basic Metabolic Panel:  Recent Labs Lab 06/05/16 1919 06/05/16 2338  NA 138  --   K 3.3*  --   CL 103  --   CO2 24  --   GLUCOSE 90  --   BUN 27*  --   CREATININE 1.36*  --   CALCIUM 10.1  --   MG  --  2.2    Liver Function Tests: No results for input(s): AST, ALT, ALKPHOS, BILITOT, PROT, ALBUMIN in the last 168 hours. No results for input(s): LIPASE, AMYLASE in the last 168 hours. No results for input(s): AMMONIA in the last 168 hours.  CBC:  Recent Labs Lab 06/05/16 1919  WBC 11.5*  HGB 14.7  HCT 42.8  MCV 97.3  PLT 267    Cardiac Enzymes:  Recent Labs Lab 06/05/16 1919  TROPONINI <0.03    Lipid Panel: No results for input(s): CHOL, TRIG, HDL, CHOLHDL, VLDL, LDLCALC in the last 168 hours.  CBG:  Recent Labs Lab 06/05/16 2300  GLUCAP 272*    Microbiology: No results found for this or any previous visit.  Coagulation Studies: No results for input(s): LABPROT, INR in the last 72 hours.  Imaging: Dg Chest 2  View  Result Date: 06/05/2016 CLINICAL DATA:  75 y/o F; dyspnea, left arm numbness, and abdominal pain today. EXAM: CHEST  2 VIEW COMPARISON:  None. FINDINGS: Normal cardiac silhouette. Clear lungs. Minimal reverse S curvature of the thoracic spine. Cholecystectomy clips. No acute osseous abnormality is identified. No pleural effusion. IMPRESSION: No active cardiopulmonary disease. Electronically Signed   By: Kristine Garbe M.D.   On: 06/05/2016 21:35   Ct Head Wo Contrast  Result Date: 06/05/2016 CLINICAL DATA:  LEFT arm numbness and tingling today. Assess for transient ischemic attack. History of hyperlipidemia, diabetes. EXAM: CT HEAD WITHOUT CONTRAST TECHNIQUE: Contiguous axial images were obtained from the base of the skull through the vertex without intravenous contrast. COMPARISON:  None. FINDINGS: BRAIN: The ventricles and sulci are normal for age. No intraparenchymal hemorrhage, mass effect nor midline shift. Patchy supratentorial white matter hypodensities less than expected for patient's age, though non-specific are most compatible with chronic small vessel ischemic disease. No acute large vascular territory infarcts. No abnormal extra-axial fluid collections. Basal cisterns are patent. VASCULAR: Moderate calcific atherosclerosis of the carotid siphons. SKULL: No skull fracture. Severe LEFT temporomandibular osteoarthrosis. No significant scalp soft tissue swelling. SINUSES/ORBITS: Soft tissue opacifies the RIGHT maxillary sinus with extensive mucoperiosteal reaction. Mild RIGHT fronto ethmoidal mucosal thickening. The included ocular globes and orbital contents are non-suspicious. OTHER: None. IMPRESSION: Negative CT HEAD for age. Chronic RIGHT paranasal sinusitis. Electronically Signed   By: Elon Alas M.D.   On: 06/05/2016 22:03   Assessment: 1. TIA with LUE weakness and sensory numbness. Symptoms now resolved without focal findings on neurological exam. MRI brain shows no  acute ischemia. MRA head normal.  2. MRI findings consistent with infectious right maxillary sinusitis, consistent of mucosal thickening with DWI+ trapped mucus. This may be related to the suspected right neck lymphadenopathy described on the radiology report.  3. DM. 4. Elevated BUN/Cr suggestive of possible volume depletion.    Recommendations: 1. Carotid  ultrasound.  2. Switch ASA to Plavix.  3. Switch Zocor to atorvastatin 40 mg po qd.  4. Permissive HTN for 24-48 hours.  5. Evaluation/management of probable right maxillary sinusitis, incidentally seen on MRI.  6. Gentle IV hydration.  7. Awaiting outside echocardiogram report.  8. May be a candidate for Holter monitoring or loop recorder.   Electronically signed: Dr. Kerney Elbe 06/06/2016, 12:09 AM

## 2016-06-06 NOTE — Progress Notes (Signed)
*  PRELIMINARY RESULTS* Vascular Ultrasound Carotid Duplex (Doppler) has been completed.  Preliminary findings: Bilateral 1-39% ICA stenosis, antegrade vertebral flow bilateral.  Difficult evaluation of left proximal internal carotid artery due to acoustic shadowing.   Everrett Coombe 06/06/2016, 2:18 PM

## 2016-06-06 NOTE — Evaluation (Signed)
Occupational Therapy Evaluation Patient Details Name: Katie Lozano MRN: [id number] DOB: 03-09-1941 Today's Date: 06/06/2016    History of Present Illness 75 yo female admitted with onset of generalized weakness, seeing stars in vision, L UE numbness, and slurred speech. CT (_) MRI (-) workup underway pta to rule out heart attachk per chart   Clinical Impression   Patient evaluated by Occupational Therapy with no further acute OT needs identified. All education has been completed and the patient has no further questions. See below for any follow-up Occupational Therapy or equipment needs. OT to sign off. Thank you for referral.      Follow Up Recommendations  No OT follow up    Equipment Recommendations  None recommended by OT    Recommendations for Other Services       Precautions / Restrictions Precautions Precautions: None      Mobility Bed Mobility               General bed mobility comments: on couch in room on arrival  Transfers Overall transfer level: Modified independent               General transfer comment: incr time and effort to power up    Balance                                            ADL Overall ADL's : Independent                                       General ADL Comments: demonstrates tub transfer and declined suggestion of tub chair use     Vision     Perception     Praxis      Pertinent Vitals/Pain Pain Assessment: No/denies pain     Hand Dominance Right   Extremity/Trunk Assessment Upper Extremity Assessment Upper Extremity Assessment: Overall WFL for tasks assessed   Lower Extremity Assessment Lower Extremity Assessment: Overall WFL for tasks assessed   Cervical / Trunk Assessment Cervical / Trunk Assessment: Normal   Communication Communication Communication: No difficulties   Cognition Arousal/Alertness: Awake/alert Behavior During Therapy: WFL for tasks  assessed/performed Overall Cognitive Status: Within Functional Limits for tasks assessed                     General Comments       Exercises       Shoulder Instructions      Home Living Family/patient expects to be discharged to:: Private residence Living Arrangements: Alone Available Help at Discharge: Family Type of Home: House Home Access: Level entry     Home Layout: One level     Bathroom Shower/Tub: Teacher, early years/pre: Standard     Home Equipment: Grab bars - tub/shower;Hand held shower head          Prior Functioning/Environment Level of Independence: Independent                 OT Problem List:     OT Treatment/Interventions:      OT Goals(Current goals can be found in the care plan section)    OT Frequency:     Barriers to D/C:            Co-evaluation  End of Session Equipment Utilized During Treatment: Gait belt Nurse Communication: Mobility status;Precautions  Activity Tolerance: Patient tolerated treatment well Patient left: Other (comment) (in gym with PT)   Time: [id number] OT Time Calculation (min): 14 min Charges:  OT General Charges $OT Visit: 1 Procedure OT Evaluation $OT Eval Moderate Complexity: 1 Procedure G-Codes:    Peri Maris 2016-06-24, 1:55 PM   Jeri Modena   OTR/L Pager: [phone] Office: [phone] .

## 2016-06-06 NOTE — Progress Notes (Signed)
Patient discharge teaching given, including activity, diet, follow-up appoints, and medications. Patient verbalized understanding of all discharge instructions. IV access was d/c'd. Vitals are stable. Skin is intact except as charted in most recent assessments. Pt to be escorted out by NT, to be driven home by family. 

## 2016-06-06 NOTE — Care Management Obs Status (Signed)
[facility] NOTIFICATION   Patient Details  Name: Katie Lozano MRN: [id number] Date of Birth: October 07, 1940   Medicare Observation Status Notification Given:  Yes (MRI negative)    Pollie Friar, RN 06/06/2016, 11:35 AM

## 2016-06-06 NOTE — Care Management Note (Signed)
Case Management Note  Patient Details  Name: Katie Lozano MRN: [id number] Date of Birth: 1941-01-17  Subjective/Objective:            Patient presented with Transient left arm numbness and weakness.  Will follow for discharge needs pending PT/OT evals and physician orders.         Action/Plan:   Expected Discharge Date:                  Expected Discharge Plan:     In-House Referral:     Discharge planning Services     Post Acute Care Choice:    Choice offered to:     DME Arranged:    DME Agency:     HH Arranged:    HH Agency:     Status of Service:     If discussed at [employer] of Stay Meetings, dates discussed:    Additional Comments:  Rolm Baptise, RN 06/06/2016, 12:04 PM

## 2016-06-07 LAB — HEMOGLOBIN A1C
HEMOGLOBIN A1C: 8.2 % — AB (ref 4.8–5.6)
MEAN PLASMA GLUCOSE: 189 mg/dL

## 2016-06-09 LAB — VAS US CAROTID
LEFT ECA DIAS: -14 cm/s
LEFT VERTEBRAL DIAS: -17 cm/s
LICADDIAS: -28 cm/s
LICADSYS: -116 cm/s
Left CCA dist dias: -16 cm/s
Left CCA dist sys: -75 cm/s
Left ICA prox dias: -28 cm/s
Left ICA prox sys: -123 cm/s
RCCAPSYS: 76 cm/s
RIGHT ECA DIAS: -11 cm/s
RIGHT VERTEBRAL DIAS: -8 cm/s
Right CCA prox dias: 14 cm/s
Right cca dist sys: -104 cm/s

## 2016-06-30 DIAGNOSIS — D62 Acute posthemorrhagic anemia: Secondary | ICD-10-CM

## 2016-06-30 DIAGNOSIS — K921 Melena: Secondary | ICD-10-CM

## 2016-06-30 DIAGNOSIS — K922 Gastrointestinal hemorrhage, unspecified: Secondary | ICD-10-CM

## 2016-06-30 HISTORY — DX: Melena: K92.1

## 2016-06-30 HISTORY — DX: Acute posthemorrhagic anemia: D62

## 2016-06-30 HISTORY — DX: Gastrointestinal hemorrhage, unspecified: K92.2

## 2016-07-11 ENCOUNTER — Inpatient Hospital Stay (HOSPITAL_COMMUNITY): Payer: PPO

## 2016-07-11 ENCOUNTER — Inpatient Hospital Stay (HOSPITAL_COMMUNITY)
Admission: EM | Admit: 2016-07-11 | Discharge: 2016-07-22 | DRG: 637 | Disposition: A | Discharge status: Skilled Nursing Facility | Payer: PPO | Attending: Internal Medicine | Admitting: Internal Medicine

## 2016-07-11 ENCOUNTER — Emergency Department (HOSPITAL_COMMUNITY): Payer: PPO

## 2016-07-11 DIAGNOSIS — R2681 Unsteadiness on feet: Secondary | ICD-10-CM | POA: Diagnosis not present

## 2016-07-11 DIAGNOSIS — K921 Melena: Secondary | ICD-10-CM | POA: Diagnosis not present

## 2016-07-11 DIAGNOSIS — E785 Hyperlipidemia, unspecified: Secondary | ICD-10-CM | POA: Diagnosis present

## 2016-07-11 DIAGNOSIS — K3189 Other diseases of stomach and duodenum: Secondary | ICD-10-CM | POA: Diagnosis not present

## 2016-07-11 DIAGNOSIS — K922 Gastrointestinal hemorrhage, unspecified: Secondary | ICD-10-CM | POA: Diagnosis present

## 2016-07-11 DIAGNOSIS — I214 Non-ST elevation (NSTEMI) myocardial infarction: Secondary | ICD-10-CM | POA: Diagnosis not present

## 2016-07-11 DIAGNOSIS — Z79899 Other long term (current) drug therapy: Secondary | ICD-10-CM | POA: Diagnosis not present

## 2016-07-11 DIAGNOSIS — K2960 Other gastritis without bleeding: Secondary | ICD-10-CM | POA: Diagnosis present

## 2016-07-11 DIAGNOSIS — E1311 Other specified diabetes mellitus with ketoacidosis with coma: Secondary | ICD-10-CM | POA: Diagnosis not present

## 2016-07-11 DIAGNOSIS — E1110 Type 2 diabetes mellitus with ketoacidosis without coma: Secondary | ICD-10-CM | POA: Diagnosis present

## 2016-07-11 DIAGNOSIS — D696 Thrombocytopenia, unspecified: Secondary | ICD-10-CM | POA: Diagnosis present

## 2016-07-11 DIAGNOSIS — A084 Viral intestinal infection, unspecified: Secondary | ICD-10-CM | POA: Diagnosis present

## 2016-07-11 DIAGNOSIS — E871 Hypo-osmolality and hyponatremia: Secondary | ICD-10-CM | POA: Diagnosis present

## 2016-07-11 DIAGNOSIS — Z86010 Personal history of colonic polyps: Secondary | ICD-10-CM

## 2016-07-11 DIAGNOSIS — Z8249 Family history of ischemic heart disease and other diseases of the circulatory system: Secondary | ICD-10-CM

## 2016-07-11 DIAGNOSIS — K209 Esophagitis, unspecified: Secondary | ICD-10-CM | POA: Diagnosis not present

## 2016-07-11 DIAGNOSIS — E1310 Other specified diabetes mellitus with ketoacidosis without coma: Secondary | ICD-10-CM | POA: Diagnosis not present

## 2016-07-11 DIAGNOSIS — D62 Acute posthemorrhagic anemia: Secondary | ICD-10-CM | POA: Diagnosis present

## 2016-07-11 DIAGNOSIS — Z9049 Acquired absence of other specified parts of digestive tract: Secondary | ICD-10-CM

## 2016-07-11 DIAGNOSIS — Z808 Family history of malignant neoplasm of other organs or systems: Secondary | ICD-10-CM

## 2016-07-11 DIAGNOSIS — E11649 Type 2 diabetes mellitus with hypoglycemia without coma: Principal | ICD-10-CM | POA: Diagnosis present

## 2016-07-11 DIAGNOSIS — R579 Shock, unspecified: Secondary | ICD-10-CM | POA: Diagnosis not present

## 2016-07-11 DIAGNOSIS — R0902 Hypoxemia: Secondary | ICD-10-CM | POA: Diagnosis present

## 2016-07-11 DIAGNOSIS — K449 Diaphragmatic hernia without obstruction or gangrene: Secondary | ICD-10-CM | POA: Diagnosis not present

## 2016-07-11 DIAGNOSIS — K2289 Other specified disease of esophagus: Secondary | ICD-10-CM

## 2016-07-11 DIAGNOSIS — R1084 Generalized abdominal pain: Secondary | ICD-10-CM | POA: Diagnosis not present

## 2016-07-11 DIAGNOSIS — D500 Iron deficiency anemia secondary to blood loss (chronic): Secondary | ICD-10-CM | POA: Diagnosis present

## 2016-07-11 DIAGNOSIS — K2210 Ulcer of esophagus without bleeding: Secondary | ICD-10-CM | POA: Diagnosis not present

## 2016-07-11 DIAGNOSIS — E876 Hypokalemia: Secondary | ICD-10-CM | POA: Diagnosis present

## 2016-07-11 DIAGNOSIS — Z87891 Personal history of nicotine dependence: Secondary | ICD-10-CM

## 2016-07-11 DIAGNOSIS — E872 Acidosis, unspecified: Secondary | ICD-10-CM | POA: Diagnosis present

## 2016-07-11 DIAGNOSIS — G9340 Encephalopathy, unspecified: Secondary | ICD-10-CM | POA: Diagnosis present

## 2016-07-11 DIAGNOSIS — Z794 Long term (current) use of insulin: Secondary | ICD-10-CM | POA: Diagnosis not present

## 2016-07-11 DIAGNOSIS — Z800 Family history of malignant neoplasm of digestive organs: Secondary | ICD-10-CM

## 2016-07-11 DIAGNOSIS — K228 Other specified diseases of esophagus: Secondary | ICD-10-CM

## 2016-07-11 DIAGNOSIS — Z452 Encounter for adjustment and management of vascular access device: Secondary | ICD-10-CM | POA: Diagnosis not present

## 2016-07-11 DIAGNOSIS — K219 Gastro-esophageal reflux disease without esophagitis: Secondary | ICD-10-CM | POA: Diagnosis present

## 2016-07-11 DIAGNOSIS — L8990 Pressure ulcer of unspecified site, unspecified stage: Secondary | ICD-10-CM | POA: Diagnosis present

## 2016-07-11 DIAGNOSIS — Z8371 Family history of colonic polyps: Secondary | ICD-10-CM

## 2016-07-11 DIAGNOSIS — K2970 Gastritis, unspecified, without bleeding: Secondary | ICD-10-CM | POA: Diagnosis not present

## 2016-07-11 DIAGNOSIS — Z8673 Personal history of transient ischemic attack (TIA), and cerebral infarction without residual deficits: Secondary | ICD-10-CM

## 2016-07-11 DIAGNOSIS — Z7902 Long term (current) use of antithrombotics/antiplatelets: Secondary | ICD-10-CM

## 2016-07-11 DIAGNOSIS — E875 Hyperkalemia: Secondary | ICD-10-CM | POA: Diagnosis not present

## 2016-07-11 DIAGNOSIS — M6281 Muscle weakness (generalized): Secondary | ICD-10-CM | POA: Diagnosis not present

## 2016-07-11 DIAGNOSIS — K5792 Diverticulitis of intestine, part unspecified, without perforation or abscess without bleeding: Secondary | ICD-10-CM | POA: Diagnosis not present

## 2016-07-11 DIAGNOSIS — N179 Acute kidney failure, unspecified: Secondary | ICD-10-CM | POA: Diagnosis present

## 2016-07-11 DIAGNOSIS — R0989 Other specified symptoms and signs involving the circulatory and respiratory systems: Secondary | ICD-10-CM | POA: Diagnosis not present

## 2016-07-11 DIAGNOSIS — E1121 Type 2 diabetes mellitus with diabetic nephropathy: Secondary | ICD-10-CM

## 2016-07-11 DIAGNOSIS — I10 Essential (primary) hypertension: Secondary | ICD-10-CM | POA: Diagnosis present

## 2016-07-11 DIAGNOSIS — R571 Hypovolemic shock: Secondary | ICD-10-CM | POA: Diagnosis present

## 2016-07-11 DIAGNOSIS — R402431 Glasgow coma scale score 3-8, in the field [EMT or ambulance]: Secondary | ICD-10-CM | POA: Diagnosis not present

## 2016-07-11 DIAGNOSIS — K319 Disease of stomach and duodenum, unspecified: Secondary | ICD-10-CM | POA: Diagnosis present

## 2016-07-11 DIAGNOSIS — R4182 Altered mental status, unspecified: Secondary | ICD-10-CM | POA: Diagnosis not present

## 2016-07-11 DIAGNOSIS — R531 Weakness: Secondary | ICD-10-CM | POA: Diagnosis not present

## 2016-07-11 DIAGNOSIS — K2971 Gastritis, unspecified, with bleeding: Secondary | ICD-10-CM | POA: Diagnosis not present

## 2016-07-11 DIAGNOSIS — Z833 Family history of diabetes mellitus: Secondary | ICD-10-CM

## 2016-07-11 DIAGNOSIS — K920 Hematemesis: Secondary | ICD-10-CM

## 2016-07-11 HISTORY — DX: Type 2 diabetes mellitus with ketoacidosis without coma: E11.10

## 2016-07-11 HISTORY — DX: Hematemesis: K92.0

## 2016-07-11 HISTORY — DX: Acute posthemorrhagic anemia: D62

## 2016-07-11 HISTORY — DX: Melena: K92.1

## 2016-07-11 HISTORY — DX: Gastrointestinal hemorrhage, unspecified: K92.2

## 2016-07-11 LAB — CBC
HEMATOCRIT: 30 % — AB (ref 36.0–46.0)
HEMOGLOBIN: 9.2 g/dL — AB (ref 12.0–15.0)
MCH: 32.3 pg (ref 26.0–34.0)
MCHC: 30.7 g/dL (ref 30.0–36.0)
MCV: 105.3 fL — ABNORMAL HIGH (ref 78.0–100.0)
Platelets: 248 10*3/uL (ref 150–400)
RBC: 2.85 MIL/uL — AB (ref 3.87–5.11)
RDW: 13.5 % (ref 11.5–15.5)
WBC: 23.3 10*3/uL — AB (ref 4.0–10.5)

## 2016-07-11 LAB — COMPREHENSIVE METABOLIC PANEL
ALBUMIN: 2.7 g/dL — AB (ref 3.5–5.0)
ALT: 29 U/L (ref 14–54)
AST: 63 U/L — AB (ref 15–41)
Alkaline Phosphatase: 85 U/L (ref 38–126)
BILIRUBIN TOTAL: 1.2 mg/dL (ref 0.3–1.2)
BUN: 79 mg/dL — AB (ref 6–20)
CALCIUM: 8.8 mg/dL — AB (ref 8.9–10.3)
CREATININE: 3.34 mg/dL — AB (ref 0.44–1.00)
Chloride: 85 mmol/L — ABNORMAL LOW (ref 101–111)
GFR calc non Af Amer: 12 mL/min — ABNORMAL LOW (ref >60)
GFR, EST AFRICAN AMERICAN: 14 mL/min — AB (ref >60)
GLUCOSE: 1060 mg/dL — AB (ref 65–99)
Potassium: 6.9 mmol/L (ref 3.5–5.1)
Sodium: 127 mmol/L — ABNORMAL LOW (ref 135–145)
Total Protein: 5.9 g/dL — ABNORMAL LOW (ref 6.5–8.1)

## 2016-07-11 LAB — GLUCOSE, CAPILLARY
Glucose-Capillary: >600 mg/dL (ref 65–99)
Glucose-Capillary: >600 mg/dL (ref 65–99)

## 2016-07-11 LAB — CBC WITH DIFFERENTIAL/PLATELET
BASOS PCT: 1 %
Basophils Absolute: 0.2 10*3/uL — ABNORMAL HIGH (ref 0.0–0.1)
EOS ABS: 0 10*3/uL (ref 0.0–0.7)
Eosinophils Relative: 0 %
HCT: 40.3 % (ref 36.0–46.0)
Hemoglobin: 11.9 g/dL — ABNORMAL LOW (ref 12.0–15.0)
LYMPHS ABS: 4 10*3/uL (ref 0.7–4.0)
Lymphocytes Relative: 17 %
MCH: 32.5 pg (ref 26.0–34.0)
MCHC: 29.5 g/dL — AB (ref 30.0–36.0)
MCV: 110.1 fL — ABNORMAL HIGH (ref 78.0–100.0)
MONO ABS: 3 10*3/uL — AB (ref 0.1–1.0)
Monocytes Relative: 13 %
NEUTROS ABS: 16.2 10*3/uL — AB (ref 1.7–7.7)
NEUTROS PCT: 69 %
PLATELETS: 268 10*3/uL (ref 150–400)
RBC: 3.66 MIL/uL — ABNORMAL LOW (ref 3.87–5.11)
RDW: 13.8 % (ref 11.5–15.5)
WBC: 23.4 10*3/uL — ABNORMAL HIGH (ref 4.0–10.5)

## 2016-07-11 LAB — BASIC METABOLIC PANEL
Anion gap: 27 — ABNORMAL HIGH (ref 5–15)
BUN: 83 mg/dL — AB (ref 6–20)
BUN: 83 mg/dL — ABNORMAL HIGH (ref 6–20)
CALCIUM: 8.1 mg/dL — AB (ref 8.9–10.3)
CALCIUM: 7.8 mg/dL — AB (ref 8.9–10.3)
CHLORIDE: 95 mmol/L — AB (ref 101–111)
CO2: 8 mmol/L — AB (ref 22–32)
CO2: <7 mmol/L — ABNORMAL LOW (ref 22–32)
CREATININE: 3.39 mg/dL — AB (ref 0.44–1.00)
Chloride: 103 mmol/L (ref 101–111)
Creatinine, Ser: 2.99 mg/dL — ABNORMAL HIGH (ref 0.44–1.00)
GFR calc Af Amer: 14 mL/min — ABNORMAL LOW (ref >60)
GFR calc non Af Amer: 12 mL/min — ABNORMAL LOW (ref >60)
GFR, EST AFRICAN AMERICAN: 16 mL/min — AB (ref >60)
GFR, EST NON AFRICAN AMERICAN: 14 mL/min — AB (ref >60)
GLUCOSE: 1038 mg/dL — AB (ref 65–99)
Glucose, Bld: 892 mg/dL (ref 65–99)
POTASSIUM: 4.3 mmol/L (ref 3.5–5.1)
Potassium: 6.8 mmol/L (ref 3.5–5.1)
Sodium: 130 mmol/L — ABNORMAL LOW (ref 135–145)
Sodium: 136 mmol/L (ref 135–145)

## 2016-07-11 LAB — I-STAT ARTERIAL BLOOD GAS, ED
Acid-base deficit: 27 mmol/L — ABNORMAL HIGH (ref 0.0–2.0)
Bicarbonate: 6.2 mmol/L — ABNORMAL LOW (ref 20.0–28.0)
O2 Saturation: 99 %
PCO2 ART: 34.5 mmHg (ref 32.0–48.0)
PO2 ART: 205 mmHg — AB (ref 83.0–108.0)
Patient temperature: 98.5
TCO2: 7 mmol/L (ref 0–100)
pH, Arterial: 6.86 — CL (ref 7.350–7.450)

## 2016-07-11 LAB — URINALYSIS, ROUTINE W REFLEX MICROSCOPIC
Bilirubin Urine: NEGATIVE
Glucose, UA: >=500 mg/dL — AB
Ketones, ur: 20 mg/dL — AB
Leukocytes, UA: NEGATIVE
Nitrite: NEGATIVE
Protein, ur: NEGATIVE mg/dL
RBC / HPF: NONE SEEN RBC/hpf (ref 0–5)
SPECIFIC GRAVITY, URINE: 1.014 (ref 1.005–1.030)
SQUAMOUS EPITHELIAL / LPF: NONE SEEN
pH: 5 (ref 5.0–8.0)

## 2016-07-11 LAB — TROPONIN I
TROPONIN I: 0.06 ng/mL — AB (ref <0.03)
TROPONIN I: 0.12 ng/mL — AB (ref <0.03)

## 2016-07-11 LAB — I-STAT CHEM 8, ED
BUN: 96 mg/dL — ABNORMAL HIGH (ref 6–20)
CALCIUM ION: 1.03 mmol/L — AB (ref 1.15–1.40)
CHLORIDE: 90 mmol/L — AB (ref 101–111)
Creatinine, Ser: 2.4 mg/dL — ABNORMAL HIGH (ref 0.44–1.00)
Glucose, Bld: >700 mg/dL (ref 65–99)
HEMATOCRIT: 34 % — AB (ref 36.0–46.0)
HEMOGLOBIN: 11.6 g/dL — AB (ref 12.0–15.0)
Potassium: 6.5 mmol/L (ref 3.5–5.1)
SODIUM: 123 mmol/L — AB (ref 135–145)
TCO2: 9 mmol/L (ref 0–100)

## 2016-07-11 LAB — CBG MONITORING, ED

## 2016-07-11 LAB — I-STAT TROPONIN, ED: Troponin i, poc: 0.02 ng/mL (ref 0.00–0.08)

## 2016-07-11 LAB — TYPE AND SCREEN
ABO/RH(D): O POS
Antibody Screen: NEGATIVE

## 2016-07-11 LAB — I-STAT CG4 LACTIC ACID, ED: LACTIC ACID, VENOUS: 13.76 mmol/L — AB (ref 0.5–1.9)

## 2016-07-11 LAB — MRSA PCR SCREENING: MRSA BY PCR: NEGATIVE

## 2016-07-11 LAB — BETA-HYDROXYBUTYRIC ACID

## 2016-07-11 LAB — ABO/RH: ABO/RH(D): O POS

## 2016-07-11 LAB — LACTIC ACID, PLASMA
LACTIC ACID, VENOUS: 9.9 mmol/L — AB (ref 0.5–1.9)
Lactic Acid, Venous: 3.5 mmol/L (ref 0.5–1.9)

## 2016-07-11 LAB — PHOSPHORUS: PHOSPHORUS: 12.4 mg/dL — AB (ref 2.5–4.6)

## 2016-07-11 LAB — MAGNESIUM: MAGNESIUM: 2.6 mg/dL — AB (ref 1.7–2.4)

## 2016-07-11 MED ORDER — SODIUM CHLORIDE 0.9 % IV SOLN
INTRAVENOUS | Status: DC
  Administered 2016-07-11 – 2016-07-12 (×3): via INTRAVENOUS

## 2016-07-11 MED ORDER — PIPERACILLIN-TAZOBACTAM 3.375 G IVPB 30 MIN
3.3750 g | Freq: Once | INTRAVENOUS | Status: AC
  Administered 2016-07-11: 3.375 g via INTRAVENOUS
  Filled 2016-07-11: qty 50

## 2016-07-11 MED ORDER — PANTOPRAZOLE SODIUM 40 MG IV SOLR
40.0000 mg | Freq: Two times a day (BID) | INTRAVENOUS | Status: DC
  Administered 2016-07-11 – 2016-07-14 (×6): 40 mg via INTRAVENOUS
  Filled 2016-07-11 (×7): qty 40

## 2016-07-11 MED ORDER — VANCOMYCIN HCL IN DEXTROSE 1-5 GM/200ML-% IV SOLN: 1000.0000 mg | INTRAVENOUS | Status: DC

## 2016-07-11 MED ORDER — SODIUM CHLORIDE 0.9 % IV BOLUS (SEPSIS)
250.0000 mL | Freq: Once | INTRAVENOUS | Status: AC
  Administered 2016-07-11: 250 mL via INTRAVENOUS

## 2016-07-11 MED ORDER — SODIUM POLYSTYRENE SULFONATE 15 GM/60ML PO SUSP
30.0000 g | Freq: Once | ORAL | Status: DC
  Filled 2016-07-11: qty 120

## 2016-07-11 MED ORDER — SODIUM CHLORIDE 0.9 % IV BOLUS (SEPSIS)
1000.0000 mL | Freq: Once | INTRAVENOUS | Status: AC
  Administered 2016-07-11: 1000 mL via INTRAVENOUS

## 2016-07-11 MED ORDER — ORAL CARE MOUTH RINSE
15.0000 mL | Freq: Two times a day (BID) | OROMUCOSAL | Status: DC
  Administered 2016-07-11 – 2016-07-22 (×20): 15 mL via OROMUCOSAL

## 2016-07-11 MED ORDER — VANCOMYCIN HCL IN DEXTROSE 1-5 GM/200ML-% IV SOLN: 1000.0000 mg | Freq: Once | INTRAVENOUS | Status: DC

## 2016-07-11 MED ORDER — DEXTROSE-NACL 5-0.45 % IV SOLN
INTRAVENOUS | Status: DC
  Administered 2016-07-12: 11:00:00 via INTRAVENOUS

## 2016-07-11 MED ORDER — PIPERACILLIN-TAZOBACTAM IN DEX 2-0.25 GM/50ML IV SOLN
2.2500 g | Freq: Three times a day (TID) | INTRAVENOUS | Status: DC
  Administered 2016-07-11 – 2016-07-13 (×5): 2.25 g via INTRAVENOUS
  Filled 2016-07-11 (×7): qty 50

## 2016-07-11 MED ORDER — VANCOMYCIN HCL 10 G IV SOLR
1250.0000 mg | Freq: Once | INTRAVENOUS | Status: AC
  Administered 2016-07-11: 1250 mg via INTRAVENOUS
  Filled 2016-07-11: qty 1250

## 2016-07-11 MED ORDER — SODIUM BICARBONATE 8.4 % IV SOLN
50.0000 meq | Freq: Once | INTRAVENOUS | Status: AC
  Administered 2016-07-11: 50 meq via INTRAVENOUS

## 2016-07-11 MED ORDER — HEPARIN SODIUM (PORCINE) 5000 UNIT/ML IJ SOLN
5000.0000 [IU] | Freq: Three times a day (TID) | INTRAMUSCULAR | Status: DC
  Administered 2016-07-11 – 2016-07-12 (×2): 5000 [IU] via SUBCUTANEOUS
  Filled 2016-07-11 (×3): qty 1

## 2016-07-11 MED ORDER — SODIUM CHLORIDE 0.9 % IV SOLN
INTRAVENOUS | Status: DC
  Administered 2016-07-11: 5.4 [IU]/h via INTRAVENOUS
  Administered 2016-07-12: 38.6 [IU]/h via INTRAVENOUS
  Filled 2016-07-11 (×3): qty 2.5

## 2016-07-11 MED ORDER — PIPERACILLIN-TAZOBACTAM 3.375 G IVPB
3.3750 g | Freq: Three times a day (TID) | INTRAVENOUS | Status: DC
  Filled 2016-07-11 (×2): qty 50

## 2016-07-11 MED ORDER — VANCOMYCIN HCL IN DEXTROSE 750-5 MG/150ML-% IV SOLN: 750.0000 mg | INTRAVENOUS | Status: DC

## 2016-07-11 MED ORDER — NOREPINEPHRINE BITARTRATE 1 MG/ML IV SOLN
0.0000 ug/min | INTRAVENOUS | Status: DC
  Administered 2016-07-11: 14 ug/min via INTRAVENOUS
  Administered 2016-07-11: 5 ug/min via INTRAVENOUS
  Administered 2016-07-12: 8 ug/min via INTRAVENOUS
  Filled 2016-07-11 (×4): qty 4

## 2016-07-11 MED ORDER — SODIUM CHLORIDE 0.9 % IV SOLN
INTRAVENOUS | Status: AC
  Administered 2016-07-11: 17:00:00 via INTRAVENOUS

## 2016-07-11 NOTE — ED Notes (Signed)
CBG: >600 ; HI 

## 2016-07-11 NOTE — Progress Notes (Signed)
   Met w/ family (sister).  Will follow, as needed.  - Rev. [place] MDiv ThM

## 2016-07-11 NOTE — ED Notes (Signed)
[facility] contacted to activate Code Sepsis 

## 2016-07-11 NOTE — H&P (Signed)
PULMONARY / CRITICAL CARE MEDICINE   Name: Katie Lozano MRN: [id number] DOB: 11-Feb-1941    ADMISSION DATE:  07/11/2016 CONSULTATION DATE:  1/12  REFERRING MD:  Verta Ellen  CHIEF COMPLAINT:  Acute encephalopathy   HISTORY OF PRESENT ILLNESS:   76 year old female w/ only known hx is DM and possible TIAs. Had recently been started on Trulicity for poorly controlled DM in-spite of home insulin. Presents to ED on 1/12 after being found almost unresponsive by her sister. Apparently was in usual state of health until 1/11 when developed worsening nausea, vomiting, diarrhea and weakness. She actually spent several hours just lying in her bath tube in stool on 1/11 but eventually got up enough strength to call a friend to assist her. She spent the day forcing fluids and resting. At night on the phone she was able to tell her sister she though it was from starting her new medication. She did not take any of her medications on 1/11. The sister tried to reach her on 1/12 but was unable. Finally got a-hold of her friend who was with her and she notified the sister she was no longer able to stand. On the sister's arrival she was not able to talk. EMS was called. On arrival: CBG was "high", bp 50s HR 100s. On arrival to ER minimally responsive. Lactate 13.76.  Got 6ml/kg fluid.  Post fluid resuscitation sepsis assessment: Now improved: more awake, extremities are warmer, cap refill brisk, SBP now in 100s.  Will admit to the intensive care.   PAST MEDICAL HISTORY :  She  has a past medical history of Allergy; Colon polyp; Diabetes mellitus without complication ([place]); Diverticulitis; GERD (gastroesophageal reflux disease); and Hyperlipidemia.  PAST SURGICAL HISTORY: She  has a past surgical history that includes Cholecystectomy (1997); Appendectomy (1956); and Tonsillectomy.  No Known Allergies  No current facility-administered medications on file prior to encounter.    Current Outpatient Prescriptions on  File Prior to Encounter  Medication Sig  . ACCU-CHEK SMARTVIEW test strip Check blood sugar 4 times daily  . Alcohol Swabs (ALCOHOL PREP) PADS   . Calcium Carb-Cholecalciferol (CALCIUM + D3 PO) Take 1 tablet by mouth daily.   . Cholecalciferol (D3 SUPER STRENGTH) 2000 UNITS CAPS Take by mouth.  . clopidogrel (PLAVIX) 75 MG tablet Take 1 tablet (75 mg total) by mouth daily.  Marland Kitchen HUMALOG KWIKPEN 200 UNIT/ML SOPN Inject 20 Units into the skin 3 (three) times daily.   . hydrochlorothiazide (HYDRODIURIL) 25 MG tablet Take 25 mg by mouth daily.  Marland Kitchen LANTUS 100 UNIT/ML injection Inject 55 Units into the skin at bedtime.   Marland Kitchen lisinopril (PRINIVIL,ZESTRIL) 10 MG tablet Take 10 mg by mouth daily.  . Multiple Vitamin (MULTI VITAMIN DAILY) TABS Take by mouth.  Marland Kitchen omeprazole (PRILOSEC) 20 MG capsule Take 20 mg by mouth daily.  . simvastatin (ZOCOR) 20 MG tablet Take 20 mg by mouth daily at 6 PM.   . ZETIA 10 MG tablet Take 10 mg by mouth daily.     FAMILY HISTORY:  Her indicated that the status of her mother is unknown. She indicated that the status of her father is unknown.    SOCIAL HISTORY: She  reports that she quit smoking about 4 years ago. Her smoking use included Cigarettes. She has never used smokeless tobacco. She reports that she does not drink alcohol or use drugs.  REVIEW OF SYSTEMS:   Unable   SUBJECTIVE:  Slowly waking up  VITAL SIGNS: There were  no vitals taken for this visit.  HEMODYNAMICS:    VENTILATOR SETTINGS:    INTAKE / OUTPUT: No intake/output data recorded.  PHYSICAL EXAMINATION: General:  Critically ill appearing white female, slow to respond. Sp slurred.  Neuro:  Awake, sp slurred. Oriented X 1, generalized weakness HEENT:  MM dry, neck veins flat.  Cardiovascular:  Tachy rrr Lungs:  Clear and w/out accessory use  Abdomen:  Soft, not tender + bowel sounds Musculoskeletal:  Warm and dry  Skin:  Warm and dry   LABS:  BMET  Recent Labs Lab 07/11/16 1515   NA 123*  K 6.5*  CL 90*  BUN 96*  CREATININE 2.40*  GLUCOSE >700*    Electrolytes No results for input(s): CALCIUM, MG, PHOS in the last 168 hours.  CBC  Recent Labs Lab 07/11/16 1515  HGB 11.6*  HCT 34.0*    Coag's No results for input(s): APTT, INR in the last 168 hours.  Sepsis Markers  Recent Labs Lab 07/11/16 1515  LATICACIDVEN 13.76*    ABG  Recent Labs Lab 07/11/16 1459  PHART 6.860*  PCO2ART 34.5  PO2ART 205.0*    Liver Enzymes No results for input(s): AST, ALT, ALKPHOS, BILITOT, ALBUMIN in the last 168 hours.  Cardiac Enzymes No results for input(s): TROPONINI, PROBNP in the last 168 hours.  Glucose  Recent Labs Lab 07/11/16 1506  GLUCAP >600*    Imaging Dg Chest Port 1 View  Result Date: 07/11/2016 CLINICAL DATA:  Unresponsive, nausea and vomiting EXAM: PORTABLE CHEST 1 VIEW COMPARISON:  06/05/2016 FINDINGS: Borderline cardiomegaly. Elevation of the right hemidiaphragm again noted. No infiltrate or pulmonary edema. Central mild vascular congestion. IMPRESSION: Cardiomegaly. Central mild vascular congestion without convincing pulmonary edema. No segmental infiltrate. Electronically Signed   By: Lahoma Crocker M.D.   On: 07/11/2016 15:34     STUDIES:   CULTURES: BC x 2 1/12>>> UC 1/12>>>  ANTIBIOTICS: vanc 1/12>> Zosyn 1/12>>>  SIGNIFICANT EVENTS:   LINES/TUBES:   DISCUSSION: 29 yof w/ probable dka and sepsis vs hypovolemic shock. Favor hypovolemia. Admitting under DKA protocol. Central access placed.  ASSESSMENT / PLAN:  PULMONARY A: Aspiration risk P:   NPO Pulse ox  O2 as needed  CARDIOVASCULAR A:  Circulatory shock. Presume hypovolemia +/- sepsis w/ acidosis complicating things P:  Aggressive fluid resuscitation Central access  CVP goal 8-12 MAP >65 Levophed for MAP >65   RENAL A:   Acute renal failure  Lactic acidosis  Hyperkalemia Probable DKA Hyponatremia  P:   IVFs per above See endocrine  section 1 amp bicarb, already received 1 calcium, will hold off on further correction of K as anticipate hypokalemia to be an issue as acidosis improves.  Holding antihypertensives  GASTROINTESTINAL A:   Nausea, vomiting and diarrhea -->? Gastroenteritis? Also just started Trulicity.  P:   NPO for now Stool for O&P  HEMATOLOGIC A:   Anemia  P:  Hardy heparin  Transfuse per protocol   INFECTIOUS A:   ? Severe sepsis in setting of gastroenteritis.  P:   Pan culture Cont empiric vanc  And zosyn for now.   ENDOCRINE A:   Probable DKA  P:   DKA protocol  Send BHA and Hgb A1C  NEUROLOGIC A:   Acute encephalopathy P:   RASS goal: 0 Holding sedating meds    FAMILY  - Updates: sister Minerva Ends family meet or Palliative Care meeting due by:1/19   My critical care time 60 minutes.  Ottis Stain  [facility] ACNP-BC [place] Pager # [phone] OR # [phone] if no answer   07/11/2016, 3:59 PM

## 2016-07-11 NOTE — Progress Notes (Signed)
Pharmacy Antibiotic Note  Katie Lozano is a 76 y.o. female admitted on 07/11/2016 due to being found unresponsive. Upon arrival she is minimally responsive, able to maintain her airway. Starting empiric abx for sepsis, pt also in DKA and has AKI, SCr 2.4, eCrCl 20-30 ml/min. LA 13.7, multiple electrolyte abnormalities.   Plan: -Vancomycin 1250 mg IV x1 then 750/24h -Zosyn 3.375 g IV q8h -Monitor renal fx, cultures, VT as needed     No data recorded.  No results for input(s): WBC, CREATININE, LATICACIDVEN, VANCOTROUGH, VANCOPEAK, VANCORANDOM, GENTTROUGH, GENTPEAK, GENTRANDOM, TOBRATROUGH, TOBRAPEAK, TOBRARND, AMIKACINPEAK, AMIKACINTROU, AMIKACIN in the last 168 hours.  CrCl cannot be calculated (Patient's most recent lab result is older than the maximum 21 days allowed.).    No Known Allergies  Antimicrobials this admission: 1/12 vancomycin > 1/12 zosyn >  Dose adjustments this admission: N/A  Microbiology results: 1/12 blood cx: 1/12 urine cx:  Thank you for allowing pharmacy to be a part of this patient's care.  Harvel Quale 07/11/2016 3:00 PM

## 2016-07-11 NOTE — ED Triage Notes (Signed)
Pt to ER by [facility] from home where family activated EMS for unresponsiveness. Family reports they spoke to her 2 hours ago in person, said she was normal per her baseline. Family noticed around 2 pm she was unresponsive. Family reports she has been sick with nausea, vomiting, and diarrhea and has not been able to take home medications. CBG per EMS reading HIGH. Pt is unresponsive on arrival. NPA in place. HR 100, BP 59/39. No IV access.

## 2016-07-11 NOTE — ED Notes (Signed)
CRITICAL VALUE ALERT  Critical value received: 6.9 K, glucose 1060  Date of notification: 07/11/2016  Time of notification: [id number]  Critical value read back: yes  Nurse who received alert:  Earleen Newport RN   MD notified: CCM

## 2016-07-11 NOTE — Progress Notes (Signed)
[place] Progress Note Patient Name: Katie Lozano DOB: 12-May-1941 MRN: [id number]   Date of Service  07/11/2016  HPI/Events of Note  K+ = 6.8 >> 6.9.  eICU Interventions  Will order: 1. Place NGT. 2. Kayexalate 30 gm per tube when NGT placed.      Intervention Category Major Interventions: Electrolyte abnormality - evaluation and management  Ambre Kobayashi [place] 07/11/2016, 6:13 PM

## 2016-07-11 NOTE — Progress Notes (Signed)
Pharmacy Antibiotic Note  Katie Lozano is a 76 y.o. female admitted on 07/11/2016 due to being found unresponsive. Upon arrival she is minimally responsive, able to maintain her airway. Starting empiric abx for sepsis.  Patient's SCr changed since admission and her CrCL is now <20 ml/min   Plan: - Change Zosyn to 2.25gm IV Q8H - Change vanc to 1gm IV Q48H, start 1/14 at 1600 - Monitor renal fxn, clinical progress, vanc trough as indicated   Temp (24hrs), Avg:97.5 F (36.4 C), Min:97.5 F (36.4 C), Max:97.5 F (36.4 C)   Recent Labs Lab 07/11/16 1511 07/11/16 1515 07/11/16 1642 07/11/16 2056  WBC 23.4*  --  23.3*  --   CREATININE 3.34* 2.40* 3.39* 2.99*  LATICACIDVEN  --  13.76* 9.9*  --     Estimated Creatinine Clearance: 15.6 mL/min (by C-G formula based on SCr of 2.99 mg/dL (H)).    No Known Allergies  Antimicrobials this admission: 1/12 vancomycin > 1/12 zosyn >  Dose adjustments this admission: N/A  Microbiology results: 1/12 blood cx: 1/12 urine cx:   Yannis Broce D. Mina Marble, PharmD, BCPS Pager:  [phone] 07/11/2016, 10:22 PM

## 2016-07-11 NOTE — Progress Notes (Signed)
Lt leg I/O removed without difficulty.  Vaseline pressure drsg. Applied.  No bleeding noted.

## 2016-07-11 NOTE — Progress Notes (Addendum)
1/12 10:33PM   Updated sister and patient at bedside.  RN reporting melenic loose stools. Patient reports having black streaks in her emesis prior to arrival. Hgb 9.2 at 1645.  -Ordered FOBT -Recheck Hgb at [facility] -PPI BID  Jacques Earthly, MD  Internal Medicine PGY-3 [phone]  1/13 12:23AM Potassium noted to be 4.3. D/c kayaxelate.

## 2016-07-11 NOTE — Progress Notes (Signed)
Critical ABG results hand delivered to Dr Regenia Skeeter, ED MD. RT will continue to closely monitor pt.

## 2016-07-11 NOTE — ED Provider Notes (Addendum)
[facility] DEPT Provider Note   CSN: [id number] Arrival date & time: 07/11/16  1440  LEVEL 5 CAVEAT - ALTERED MENTAL STATUS   History   Chief Complaint Chief Complaint  Patient presents with  . Altered Mental Status    HPI Katie Lozano is a 76 y.o. female.  HPI  76 year old female presents with altered mental status/lethargy. History very limited and only comes from EMS who got history from family at home. Vomiting for 2 days. Reportedly seen normal 2 hours ago and then now unresponsive. Glucose read "high". History of diabetes. Unknown further history. Not a DNR  Past Medical History:  Diagnosis Date  . Allergy   . Colon polyp   . Diabetes mellitus without complication ([place])   . Diverticulitis   . GERD (gastroesophageal reflux disease)   . Hyperlipidemia     Patient Active Problem List   Diagnosis Date Noted  . Essential hypertension 06/06/2016  . Hypokalemia 06/06/2016  . CKD (chronic kidney disease), stage III 06/06/2016  . Carotid artery syndrome 06/05/2016  . Type 2 diabetes mellitus with diabetic nephropathy, with long-term current use of insulin ([place]) 03/30/2015  . History of colonic polyps 03/30/2015  . Hyperlipidemia 03/30/2015    Past Surgical History:  Procedure Laterality Date  . APPENDECTOMY  1956  . CHOLECYSTECTOMY  1997  . TONSILLECTOMY     age 28    OB History    No data available       Home Medications    Prior to Admission medications   Medication Sig Start Date End Date Taking? Authorizing Provider  ACCU-CHEK SMARTVIEW test strip Check blood sugar 4 times daily 03/28/15   Historical Provider, MD  Alcohol Swabs (ALCOHOL PREP) PADS  03/13/15   Historical Provider, MD  Calcium Carb-Cholecalciferol (CALCIUM + D3 PO) Take 1 tablet by mouth daily.     Historical Provider, MD  Cholecalciferol (D3 SUPER STRENGTH) 2000 UNITS CAPS Take by mouth.    Historical Provider, MD  clopidogrel (PLAVIX) 75 MG tablet Take 1 tablet (75 mg total) by  mouth daily. 06/07/16   Thurnell Lose, MD  HUMALOG KWIKPEN 200 UNIT/ML SOPN Inject 20 Units into the skin 3 (three) times daily.  02/21/15   Historical Provider, MD  hydrochlorothiazide (HYDRODIURIL) 25 MG tablet Take 25 mg by mouth daily.    Historical Provider, MD  LANTUS 100 UNIT/ML injection Inject 55 Units into the skin at bedtime.  03/09/15   Historical Provider, MD  lisinopril (PRINIVIL,ZESTRIL) 10 MG tablet Take 10 mg by mouth daily.    Historical Provider, MD  Multiple Vitamin (MULTI VITAMIN DAILY) TABS Take by mouth.    Historical Provider, MD  omeprazole (PRILOSEC) 20 MG capsule Take 20 mg by mouth daily.    Historical Provider, MD  simvastatin (ZOCOR) 20 MG tablet Take 20 mg by mouth daily at 6 PM.  02/24/15   Historical Provider, MD  ZETIA 10 MG tablet Take 10 mg by mouth daily.  03/28/15   Historical Provider, MD    Family History Family History  Problem Relation Age of Onset  . Heart disease Mother   . Melanoma Mother   . Heart disease Father     MI  . Diabetes Father   . Colon cancer Maternal Uncle   . Colon polyps Maternal Uncle     Social History Social History  Substance Use Topics  . Smoking status: Former Smoker    Types: Cigarettes    Quit date: 06/29/2012  .  Smokeless tobacco: Never Used  . Alcohol use No     Allergies   Patient has no known allergies.   Review of Systems Review of Systems  Unable to perform ROS: Mental status change     Physical Exam Updated Vital Signs BP (!) 102/40   Pulse (!) 108   Temp 97.5 F (36.4 C) (Oral)   Resp (!) 29   SpO2 100%   Physical Exam  Constitutional: She appears well-developed. She appears lethargic. She has a sickly appearance. She appears ill.  HENT:  Head: Normocephalic and atraumatic.  Right Ear: External ear normal.  Left Ear: External ear normal.  Nose: Nose normal.  Mouth/Throat: Mucous membranes are dry.  Eyes: Pupils are equal, round, and reactive to light. Right eye exhibits no discharge.  Left eye exhibits no discharge.  Cardiovascular: Normal rate, regular rhythm and normal heart sounds.   Pulmonary/Chest: Breath sounds normal. No accessory muscle usage. Tachypnea noted.  Abdominal: Soft. There is no tenderness.  Neurological: She appears lethargic.  Lethargic. Arouses to painful stimuli but does not follow commands. Tries to speak but it is inaudible  Skin: Skin is warm and dry. There is pallor.  Nursing note and vitals reviewed.    ED Treatments / Results  Labs (all labs ordered are listed, but only abnormal results are displayed) Labs Reviewed  I-STAT CG4 LACTIC ACID, ED - Abnormal; Notable for the following:       Result Value   Lactic Acid, Venous 13.76 (*)    All other components within normal limits  I-STAT ARTERIAL BLOOD GAS, ED - Abnormal; Notable for the following:    pH, Arterial 6.860 (*)    pO2, Arterial 205.0 (*)    Bicarbonate 6.2 (*)    Acid-base deficit 27.0 (*)    All other components within normal limits  I-STAT CHEM 8, ED - Abnormal; Notable for the following:    Sodium 123 (*)    Potassium 6.5 (*)    Chloride 90 (*)    BUN 96 (*)    Creatinine, Ser 2.40 (*)    Glucose, Bld >700 (*)    Calcium, Ion 1.03 (*)    Hemoglobin 11.6 (*)    HCT 34.0 (*)    All other components within normal limits  CBG MONITORING, ED - Abnormal; Notable for the following:    Glucose-Capillary >600 (*)    All other components within normal limits  CULTURE, BLOOD (ROUTINE X 2)  CULTURE, BLOOD (ROUTINE X 2)  URINE CULTURE  COMPREHENSIVE METABOLIC PANEL  CBC WITH DIFFERENTIAL/PLATELET  URINALYSIS, ROUTINE W REFLEX MICROSCOPIC  TROPONIN I  PROTIME-INR  I-STAT TROPOININ, ED  TYPE AND SCREEN    EKG  EKG Interpretation  Date/Time:  Friday July 11 2016 14:44:58 EST Ventricular Rate:  73 PR Interval:    QRS Duration: 159 QT Interval:  504 QTC Calculation: 556 R Axis:   41 Text Interpretation:  Sinus or ectopic atrial rhythm Left bundle branch block T  waves appear more peaked than dec 2017 Confirmed by Mafalda Mcginniss MD, Elexus Barman [phone]) on 07/11/2016 7:19:58 PM       Radiology Portable Chest X-ray (1 View)  Result Date: 07/11/2016 CLINICAL DATA:  Central line placement EXAM: PORTABLE CHEST 1 VIEW COMPARISON:  07/11/2016 FINDINGS: Multiple support leads obscure the chest. Insertion of left-sided central venous catheter, the tip is faintly visualized over the proximal right atrium. No pneumothorax. Low lung volumes without acute infiltrate. Mild subsegmental atelectasis at the left CP angle. Stable  cardiomediastinal silhouette. Surgical clips in the right upper quadrant. IMPRESSION: Left-sided central venous catheter tip overlies the proximal right atrium. No pneumothorax. Electronically Signed   By: Donavan Foil M.D.   On: 07/11/2016 16:45   Dg Chest Port 1 View  Result Date: 07/11/2016 CLINICAL DATA:  Unresponsive, nausea and vomiting EXAM: PORTABLE CHEST 1 VIEW COMPARISON:  06/05/2016 FINDINGS: Borderline cardiomegaly. Elevation of the right hemidiaphragm again noted. No infiltrate or pulmonary edema. Central mild vascular congestion. IMPRESSION: Cardiomegaly. Central mild vascular congestion without convincing pulmonary edema. No segmental infiltrate. Electronically Signed   By: Lahoma Crocker M.D.   On: 07/11/2016 15:34    Procedures Procedures (including critical care time) CRITICAL CARE Performed by: Sherwood Gambler T   Total critical care time: 40 minutes  Critical care time was exclusive of separately billable procedures and treating other patients.  Critical care was necessary to treat or prevent imminent or life-threatening deterioration.  Critical care was time spent personally by me on the following activities: development of treatment plan with patient and/or surrogate as well as nursing, discussions with consultants, evaluation of patient's response to treatment, examination of patient, obtaining history from patient or surrogate,  ordering and performing treatments and interventions, ordering and review of laboratory studies, ordering and review of radiographic studies, pulse oximetry and re-evaluation of patient's condition.   Medications Ordered in ED Medications  sodium chloride 0.9 % bolus 1,000 mL (1,000 mLs Intravenous New Bag/Given 07/11/16 1502)    And  sodium chloride 0.9 % bolus 1,000 mL (1,000 mLs Intravenous New Bag/Given 07/11/16 1502)    And  sodium chloride 0.9 % bolus 250 mL (not administered)  piperacillin-tazobactam (ZOSYN) IVPB 3.375 g (not administered)  vancomycin (VANCOCIN) 1,250 mg in sodium chloride 0.9 % 250 mL IVPB (not administered)  norepinephrine (LEVOPHED) 4 mg in dextrose 5 % 250 mL (0.016 mg/mL) infusion (not administered)     Initial Impression / Assessment and Plan / ED Course  I have reviewed the triage vital signs and the nursing notes.  Pertinent labs & imaging results that were available during my care of the patient were reviewed by me and considered in my medical decision making (see chart for details).  Clinical Course     Patient very lethargic, critically ill appearing. On arrival SBP 50s. Resuscitated with IV fluids. Started on sepsis protocol given limited history. Labs support DKA with severe acidosis (PH 6.8). Could all be hypovolemia but covered with antibiotics. On arrival she was lethargic but breathing on own. Given profound hypotension, was not intubated due to concern she would code and needed resuscitation first. After resuscitation, mental status has improved and thus will hold on intubation at this time. Hyperkalemic, given calcium. ICU to add bicarb given severity. Started on levophed given profound hypotension, plan to wean as fluid resuscitation continues. After fluids will start insulin. Admit to critical care in critical condition.  Final Clinical Impressions(s) / ED Diagnoses   Final diagnoses:  Hypovolemic shock ([place])  AKI (acute kidney injury) ([place])    Metabolic acidosis  Diabetic ketoacidosis without coma associated with type 2 diabetes mellitus [facility])    New Prescriptions New Prescriptions   No medications on file     Sherwood Gambler, MD 07/11/16 Curly Rim    Sherwood Gambler, MD 07/11/16 [phone]

## 2016-07-11 NOTE — Procedures (Signed)
Central Venous Catheter Insertion Procedure Note ORLETTA SOLIMINE [id number] 14-Jul-1940  Procedure: Insertion of Central Venous Catheter Indications: Assessment of intravascular volume, Drug and/or fluid administration and Frequent blood sampling  Procedure Details Consent: Unable to obtain consent because of emergent medical necessity. Time Out: Verified patient identification, verified procedure, site/side was marked, verified correct patient position, special equipment/implants available, medications/allergies/relevent history reviewed, required imaging and test results available.  Performed  Maximum sterile technique was used including antiseptics, cap, gloves, gown, hand hygiene, mask and sheet. Skin prep: Chlorhexidine; local anesthetic administered A antimicrobial bonded/coated triple lumen catheter was placed in the left internal jugular vein using the Seldinger technique.  Evaluation Blood flow good Complications: No apparent complications Patient did not tolerate procedure well. Chest X-ray ordered to verify placement.  CXR: pending.  Hayden Pedro, AG-ACNP [facility]  Pgr: [phone]  PCCM Pgr: [phone]

## 2016-07-11 NOTE — Progress Notes (Signed)
Responded to page to support patient and family. Sister was escorted to consultation room B. Family noticed around 2 pm she was unresponsive. Family reports she has been sick with nausea, vomiting, and diarrhea and has not been able to take home medications.  EDP spoke with sister. I referred patient  to chaplain colleague for continued support as needed.Marland Kitchen    07/11/16 1500  Clinical Encounter Type  Visited With Patient;Family;Health care provider  Visit Type Initial;Spiritual support;ED  Referral From Nurse  Spiritual Encounters  Spiritual Needs Emotional  Stress Factors  Family Stress Factors Health changes  Jaclynn Major, [place]

## 2016-07-11 NOTE — ED Notes (Signed)
Elink notified of critical values. Placing orders at this time.

## 2016-07-11 NOTE — ED Notes (Signed)
  CBG: >600 ; HI 

## 2016-07-11 NOTE — Progress Notes (Signed)
eLink Physician-Brief Progress Note Patient Name: Katie Lozano DOB: 04-29-1941 MRN: [id number]   Date of Service  07/11/2016  HPI/Events of Note  Patient admitted with DKA. Lactic Acid @4 :42 PM = 9.9. BP soft 106/39 with MAP = 58.   eICU Interventions  Will order: 1. Bolus with 0.9 NaCl 1 liter IV over 1 hour now. 2. Repeat Lactic Acid at 10 PM and Q 3 hours X 2.     Intervention Category Evaluation Type: New Patient Evaluation  Lysle Dingwall 07/11/2016, 8:58 PM

## 2016-07-12 DIAGNOSIS — R579 Shock, unspecified: Secondary | ICD-10-CM

## 2016-07-12 DIAGNOSIS — N179 Acute kidney failure, unspecified: Secondary | ICD-10-CM

## 2016-07-12 DIAGNOSIS — I214 Non-ST elevation (NSTEMI) myocardial infarction: Secondary | ICD-10-CM

## 2016-07-12 LAB — BASIC METABOLIC PANEL
Anion gap: 10 (ref 5–15)
Anion gap: 13 (ref 5–15)
Anion gap: 16 — ABNORMAL HIGH (ref 5–15)
Anion gap: 22 — ABNORMAL HIGH (ref 5–15)
BUN: 76 mg/dL — ABNORMAL HIGH (ref 6–20)
BUN: 63 mg/dL — AB (ref 6–20)
BUN: 82 mg/dL — AB (ref 6–20)
BUN: 71 mg/dL — AB (ref 6–20)
CHLORIDE: 109 mmol/L (ref 101–111)
CHLORIDE: 114 mmol/L — AB (ref 101–111)
CHLORIDE: 119 mmol/L — AB (ref 101–111)
CO2: 15 mmol/L — AB (ref 22–32)
CO2: 10 mmol/L — AB (ref 22–32)
CO2: 7 mmol/L — AB (ref 22–32)
CO2: 11 mmol/L — AB (ref 22–32)
CREATININE: 2.44 mg/dL — AB (ref 0.44–1.00)
CREATININE: 2.77 mg/dL — AB (ref 0.44–1.00)
Calcium: 7.4 mg/dL — ABNORMAL LOW (ref 8.9–10.3)
Calcium: 7.4 mg/dL — ABNORMAL LOW (ref 8.9–10.3)
Calcium: 7.4 mg/dL — ABNORMAL LOW (ref 8.9–10.3)
Calcium: 7.5 mg/dL — ABNORMAL LOW (ref 8.9–10.3)
Chloride: 118 mmol/L — ABNORMAL HIGH (ref 101–111)
Creatinine, Ser: 2.08 mg/dL — ABNORMAL HIGH (ref 0.44–1.00)
Creatinine, Ser: 1.43 mg/dL — ABNORMAL HIGH (ref 0.44–1.00)
GFR calc Af Amer: 18 mL/min — ABNORMAL LOW (ref >60)
GFR calc Af Amer: 21 mL/min — ABNORMAL LOW (ref >60)
GFR calc Af Amer: 40 mL/min — ABNORMAL LOW (ref >60)
GFR calc non Af Amer: 15 mL/min — ABNORMAL LOW (ref >60)
GFR calc non Af Amer: 18 mL/min — ABNORMAL LOW (ref >60)
GFR calc non Af Amer: 22 mL/min — ABNORMAL LOW (ref >60)
GFR, EST AFRICAN AMERICAN: 25 mL/min — AB (ref >60)
GFR, EST NON AFRICAN AMERICAN: 34 mL/min — AB (ref >60)
GLUCOSE: 285 mg/dL — AB (ref 65–99)
GLUCOSE: 724 mg/dL — AB (ref 65–99)
GLUCOSE: 329 mg/dL — AB (ref 65–99)
Glucose, Bld: 500 mg/dL — ABNORMAL HIGH (ref 65–99)
POTASSIUM: 3.7 mmol/L (ref 3.5–5.1)
POTASSIUM: 3.6 mmol/L (ref 3.5–5.1)
POTASSIUM: 3.9 mmol/L (ref 3.5–5.1)
Potassium: 3.8 mmol/L (ref 3.5–5.1)
SODIUM: 140 mmol/L (ref 135–145)
Sodium: 138 mmol/L (ref 135–145)
Sodium: 143 mmol/L (ref 135–145)
Sodium: 143 mmol/L (ref 135–145)

## 2016-07-12 LAB — CBC
HCT: 26.8 % — ABNORMAL LOW (ref 36.0–46.0)
HEMATOCRIT: 29.3 % — AB (ref 36.0–46.0)
HEMATOCRIT: 26.1 % — AB (ref 36.0–46.0)
HEMOGLOBIN: 9.7 g/dL — AB (ref 12.0–15.0)
HEMOGLOBIN: 9.3 g/dL — AB (ref 12.0–15.0)
Hemoglobin: 9.4 g/dL — ABNORMAL LOW (ref 12.0–15.0)
MCH: 32.1 pg (ref 26.0–34.0)
MCH: 32.5 pg (ref 26.0–34.0)
MCH: 33 pg (ref 26.0–34.0)
MCHC: 33.1 g/dL (ref 30.0–36.0)
MCHC: 34.7 g/dL (ref 30.0–36.0)
MCHC: 36 g/dL (ref 30.0–36.0)
MCV: 97 fL (ref 78.0–100.0)
MCV: 93.7 fL (ref 78.0–100.0)
MCV: 91.6 fL (ref 78.0–100.0)
PLATELETS: 163 10*3/uL (ref 150–400)
Platelets: 186 10*3/uL (ref 150–400)
Platelets: 124 10*3/uL — ABNORMAL LOW (ref 150–400)
RBC: 3.02 MIL/uL — ABNORMAL LOW (ref 3.87–5.11)
RBC: 2.86 MIL/uL — AB (ref 3.87–5.11)
RBC: 2.85 MIL/uL — ABNORMAL LOW (ref 3.87–5.11)
RDW: 12.9 % (ref 11.5–15.5)
RDW: 12.9 % (ref 11.5–15.5)
RDW: 12.9 % (ref 11.5–15.5)
WBC: 19.4 10*3/uL — ABNORMAL HIGH (ref 4.0–10.5)
WBC: 16.4 10*3/uL — AB (ref 4.0–10.5)
WBC: 13.5 10*3/uL — ABNORMAL HIGH (ref 4.0–10.5)

## 2016-07-12 LAB — GLUCOSE, CAPILLARY
GLUCOSE-CAPILLARY: 219 mg/dL — AB (ref 65–99)
GLUCOSE-CAPILLARY: 317 mg/dL — AB (ref 65–99)
GLUCOSE-CAPILLARY: 365 mg/dL — AB (ref 65–99)
GLUCOSE-CAPILLARY: 392 mg/dL — AB (ref 65–99)
GLUCOSE-CAPILLARY: 407 mg/dL — AB (ref 65–99)
GLUCOSE-CAPILLARY: 544 mg/dL — AB (ref 65–99)
GLUCOSE-CAPILLARY: 558 mg/dL — AB (ref 65–99)
Glucose-Capillary: 171 mg/dL — ABNORMAL HIGH (ref 65–99)
Glucose-Capillary: 182 mg/dL — ABNORMAL HIGH (ref 65–99)
Glucose-Capillary: 189 mg/dL — ABNORMAL HIGH (ref 65–99)
Glucose-Capillary: 196 mg/dL — ABNORMAL HIGH (ref 65–99)
Glucose-Capillary: 216 mg/dL — ABNORMAL HIGH (ref 65–99)
Glucose-Capillary: 265 mg/dL — ABNORMAL HIGH (ref 65–99)
Glucose-Capillary: 270 mg/dL — ABNORMAL HIGH (ref 65–99)
Glucose-Capillary: 380 mg/dL — ABNORMAL HIGH (ref 65–99)
Glucose-Capillary: 449 mg/dL — ABNORMAL HIGH (ref 65–99)
Glucose-Capillary: 485 mg/dL — ABNORMAL HIGH (ref 65–99)
Glucose-Capillary: 592 mg/dL (ref 65–99)
Glucose-Capillary: >600 mg/dL (ref 65–99)

## 2016-07-12 LAB — TROPONIN I
TROPONIN I: 0.34 ng/mL — AB (ref <0.03)
TROPONIN I: 0.52 ng/mL — AB (ref <0.03)
Troponin I: 0.91 ng/mL (ref <0.03)
Troponin I: 0.82 ng/mL (ref <0.03)

## 2016-07-12 LAB — PROCALCITONIN: PROCALCITONIN: 7.79 ng/mL

## 2016-07-12 LAB — HEMOGLOBIN A1C
Hgb A1c MFr Bld: 8.8 % — ABNORMAL HIGH (ref 4.8–5.6)
Mean Plasma Glucose: 206 mg/dL

## 2016-07-12 LAB — OCCULT BLOOD X 1 CARD TO LAB, STOOL: FECAL OCCULT BLD: POSITIVE — AB

## 2016-07-12 LAB — LACTIC ACID, PLASMA: Lactic Acid, Venous: 3 mmol/L (ref 0.5–1.9)

## 2016-07-12 MED ORDER — ALTEPLASE 2 MG IJ SOLR
2.0000 mg | Freq: Once | INTRAMUSCULAR | Status: AC
  Administered 2016-07-12: 2 mg
  Filled 2016-07-12: qty 2

## 2016-07-12 MED ORDER — SODIUM CHLORIDE 0.9 % IV SOLN
30.0000 meq | Freq: Once | INTRAVENOUS | Status: AC
  Administered 2016-07-12: 30 meq via INTRAVENOUS
  Filled 2016-07-12 (×2): qty 15

## 2016-07-12 MED ORDER — SODIUM CHLORIDE 0.45 % IV SOLN: INTRAVENOUS | Status: DC

## 2016-07-12 MED ORDER — INSULIN GLARGINE 100 UNIT/ML ~~LOC~~ SOLN
40.0000 [IU] | Freq: Every day | SUBCUTANEOUS | Status: DC
  Administered 2016-07-12: 40 [IU] via SUBCUTANEOUS
  Filled 2016-07-12 (×2): qty 0.4

## 2016-07-12 MED ORDER — INSULIN ASPART 100 UNIT/ML ~~LOC~~ SOLN
0.0000 [IU] | Freq: Three times a day (TID) | SUBCUTANEOUS | Status: DC
  Administered 2016-07-12: 3 [IU] via SUBCUTANEOUS

## 2016-07-12 MED ORDER — INSULIN ASPART 100 UNIT/ML ~~LOC~~ SOLN
0.0000 [IU] | Freq: Every day | SUBCUTANEOUS | Status: DC
  Administered 2016-07-12: 2 [IU] via SUBCUTANEOUS

## 2016-07-12 MED ORDER — NOREPINEPHRINE BITARTRATE 1 MG/ML IV SOLN: 0.0000 ug/min | INTRAVENOUS | Status: DC

## 2016-07-12 MED ORDER — SODIUM CHLORIDE 0.45 % IV SOLN
INTRAVENOUS | Status: DC
  Administered 2016-07-12 – 2016-07-13 (×2): via INTRAVENOUS
  Filled 2016-07-12 (×7): qty 75

## 2016-07-12 NOTE — Progress Notes (Signed)
PULMONARY / CRITICAL CARE MEDICINE   Name: Katie Lozano MRN: [id number] DOB: October 18, 1940    ADMISSION DATE:  07/11/2016 CONSULTATION DATE:  1/12  REFERRING MD:  Verta Ellen  CHIEF COMPLAINT:  Acute encephalopathy   BRIEF:  76 y/o female with DM2 and recent TIA who was admitted on 1/13 with acute encephalopathy from DKA after a viral gastroenteritis.  This has been associated with shock, NSTEMI and lactic acidosis.   SUBJECTIVE:  Gap closed, remains in shock on levophed Dark stool overnight More awake   VITAL SIGNS: BP (!) 104/54   Pulse 96   Temp 97.7 F (36.5 C) (Oral)   Resp (!) 30   Ht 5\' 5"  (1.651 m)   Wt 71.2 kg (156 lb 15.5 oz)   SpO2 100%   BMI 26.12 kg/m   HEMODYNAMICS:    VENTILATOR SETTINGS:    INTAKE / OUTPUT: I/O last 3 completed shifts: In: 6123.3 [I.V.:3758.3; IV Piggyback:2365] Out: 2375 [Urine:2275; Stool:100]  PHYSICAL EXAMINATION: General:  Chronically ill appearing elderly female HENT: NCAT, OP clear PULM: CTA B CV: RRR, no mgr GI: BS+, soft, nontender MSK: normal bulk and tone Neuro: sleepy but arouses, oriented to hospital, situation, moves all four extremities  LABS:  BMET  Recent Labs Lab 07/12/16 0010 07/12/16 0400 07/12/16 0803  NA 138 140 143  K 3.9 3.6 3.8  CL 109 114* 119*  CO2 7* 10* 11*  BUN 82* 76* 71*  CREATININE 2.77* 2.44* 2.08*  GLUCOSE 724* 500* 329*    Electrolytes  Recent Labs Lab 07/11/16 1642  07/12/16 0010 07/12/16 0400 07/12/16 0803  CALCIUM 8.1*  < > 7.4* 7.4* 7.4*  MG 2.6*  --   --   --   --   PHOS 12.4*  --   --   --   --   < > = values in this interval not displayed.  CBC  Recent Labs Lab 07/11/16 1642 07/12/16 0148 07/12/16 0804  WBC 23.3* 19.4* 16.4*  HGB 9.2* 9.7* 9.3*  HCT 30.0* 29.3* 26.8*  PLT 248 186 163    Coag's No results for input(s): APTT, INR in the last 168 hours.  Sepsis Markers  Recent Labs Lab 07/11/16 1642 07/11/16 2200 07/12/16 0010  LATICACIDVEN  9.9* 3.5* 3.0*    ABG  Recent Labs Lab 07/11/16 1459  PHART 6.860*  PCO2ART 34.5  PO2ART 205.0*    Liver Enzymes  Recent Labs Lab 07/11/16 1511  AST 63*  ALT 29  ALKPHOS 85  BILITOT 1.2  ALBUMIN 2.7*    Cardiac Enzymes  Recent Labs Lab 07/11/16 2057 07/12/16 0400 07/12/16 0804  TROPONINI 0.12* 0.34* 0.52*    Glucose  Recent Labs Lab 07/12/16 0718 07/12/16 0829 07/12/16 0938 07/12/16 1031 07/12/16 1133 07/12/16 1236  GLUCAP 365* 317* 265* 219* 189* 182*    Imaging Portable Chest X-ray (1 View)  Result Date: 07/11/2016 CLINICAL DATA:  Central line placement EXAM: PORTABLE CHEST 1 VIEW COMPARISON:  07/11/2016 FINDINGS: Multiple support leads obscure the chest. Insertion of left-sided central venous catheter, the tip is faintly visualized over the proximal right atrium. No pneumothorax. Low lung volumes without acute infiltrate. Mild subsegmental atelectasis at the left CP angle. Stable cardiomediastinal silhouette. Surgical clips in the right upper quadrant. IMPRESSION: Left-sided central venous catheter tip overlies the proximal right atrium. No pneumothorax. Electronically Signed   By: Donavan Foil M.D.   On: 07/11/2016 16:45   Dg Chest Port 1 View  Result Date: 07/11/2016 CLINICAL DATA:  Unresponsive, nausea and vomiting EXAM: PORTABLE CHEST 1 VIEW COMPARISON:  06/05/2016 FINDINGS: Borderline cardiomegaly. Elevation of the right hemidiaphragm again noted. No infiltrate or pulmonary edema. Central mild vascular congestion. IMPRESSION: Cardiomegaly. Central mild vascular congestion without convincing pulmonary edema. No segmental infiltrate. Electronically Signed   By: Lahoma Crocker M.D.   On: 07/11/2016 15:34     STUDIES:   CULTURES: BC x 2 1/12>>> 1/2 aerococcus  UC 1/12>>>  ANTIBIOTICS: vanc 1/12>> 1/13 Zosyn 1/12>>>  SIGNIFICANT EVENTS:   LINES/TUBES:   DISCUSSION: 36 yof w/ probable dka and sepsis vs hypovolemic shock. Favor hypovolemia.  Admitting under DKA protocol. Central access placed.   ASSESSMENT / PLAN:  PULMONARY A: No acute issues P:   Monitor O2 saturation  CARDIOVASCULAR A:  Shock persists, uncertain etiology; doubt sepsis, suspect hypovolemic but appears euvolemic on exam; ddx cardiogenic vs less likely septic NSTEMI P:  Monitor CVP Continue levophed as needed to maintain MAP > 65 Echo Repeat 12 lead Cycle troponin Hold ASA/anticoagulation with possible GI bleed  RENAL A:   AKI> improving Lactic acidosis > improving Hyperkalemia  > resolved DKA Hyponatremia > resolved Non-anion gap acidosis on 1/13> due to saline resuscitation P:   Change IVF to 1/2 NS with sodium bicarbonate Continue to monitor BMET q6h Monitor BMET and UOP Replace electrolytes as needed   GASTROINTESTINAL A:   Recent Gastroenteritis Now black stool without abrupt changes in Hgb or hematemesis P:   Stool guaic now Repeat CBC Continue PPI bid for now Advance diet to clears Consult GI if Hgb dropping or increase in stool output  HEMATOLOGIC A:   Anemia  P:  Hold sub q heparin Transfuse per protocol   INFECTIOUS A:   Aerococcus bacteremia> likely contaminant Doubt Sepsis, no clear evidence of infection  Recent viral gastroenteritis P:   Follow cultures Stop vanc Continue zosyn for now  ENDOCRINE A:   DKA, ketones in urine  P:   Stop DKA protocol Add back sub cutaneous insulin long acting (40 units) and AC/qHS SSI Diabetic diet  NEUROLOGIC A:   Acute encephalopathy P:   RASS goal: 0 Holding sedating meds   FAMILY  - Updates: Sister and brother updated bedside 1/13  - Inter-disciplinary family meet or Palliative Care meeting due by:1/19   My critical care time 40 minutes.   Roselie Awkward, MD [place] PCCM Pager: [phone] Cell: [phone] After 3pm or if no response, call [phone]   07/12/2016, 12:45 PM

## 2016-07-12 NOTE — Progress Notes (Signed)
Spoke to Pharmacist about DKA multiplier. Insulin held for 30 min. Multiplier change on insulin stabilizer. Will check blood glycose and restart insulin per protocol.   Spoke to the MD about need of gastric tube and concern for pt having black tarry stools. Hold for now at this point. Pt drowsy, oriented to self and place. At times forgetful and confused(talking to someone who is not in the room). Of then says "I fell, I fell, I am on the floor". Pt reassurance, reorientation and emotional support was given.

## 2016-07-12 NOTE — Progress Notes (Signed)
Hemoccult lab was sent to Lab. MD at the bedside, updating family (brother and sister)

## 2016-07-13 ENCOUNTER — Inpatient Hospital Stay (HOSPITAL_COMMUNITY): Payer: PPO

## 2016-07-13 ENCOUNTER — Other Ambulatory Visit (HOSPITAL_COMMUNITY): Payer: PPO

## 2016-07-13 DIAGNOSIS — R579 Shock, unspecified: Secondary | ICD-10-CM

## 2016-07-13 DIAGNOSIS — N179 Acute kidney failure, unspecified: Secondary | ICD-10-CM | POA: Diagnosis present

## 2016-07-13 DIAGNOSIS — K921 Melena: Secondary | ICD-10-CM | POA: Diagnosis present

## 2016-07-13 DIAGNOSIS — E1310 Other specified diabetes mellitus with ketoacidosis without coma: Secondary | ICD-10-CM

## 2016-07-13 DIAGNOSIS — K922 Gastrointestinal hemorrhage, unspecified: Secondary | ICD-10-CM | POA: Diagnosis present

## 2016-07-13 DIAGNOSIS — D62 Acute posthemorrhagic anemia: Secondary | ICD-10-CM | POA: Diagnosis present

## 2016-07-13 LAB — BASIC METABOLIC PANEL
ANION GAP: 12 (ref 5–15)
Anion gap: 10 (ref 5–15)
BUN: 52 mg/dL — AB (ref 6–20)
BUN: 44 mg/dL — AB (ref 6–20)
CALCIUM: 7.4 mg/dL — AB (ref 8.9–10.3)
CHLORIDE: 117 mmol/L — AB (ref 101–111)
CO2: 16 mmol/L — AB (ref 22–32)
CO2: 16 mmol/L — ABNORMAL LOW (ref 22–32)
CREATININE: 1.38 mg/dL — AB (ref 0.44–1.00)
Calcium: 7.3 mg/dL — ABNORMAL LOW (ref 8.9–10.3)
Chloride: 114 mmol/L — ABNORMAL HIGH (ref 101–111)
Creatinine, Ser: 1.25 mg/dL — ABNORMAL HIGH (ref 0.44–1.00)
GFR calc Af Amer: 47 mL/min — ABNORMAL LOW (ref >60)
GFR calc non Af Amer: 36 mL/min — ABNORMAL LOW (ref >60)
GFR calc non Af Amer: 40 mL/min — ABNORMAL LOW (ref >60)
GFR, EST AFRICAN AMERICAN: 42 mL/min — AB (ref >60)
GLUCOSE: 337 mg/dL — AB (ref 65–99)
Glucose, Bld: 305 mg/dL — ABNORMAL HIGH (ref 65–99)
POTASSIUM: 3.5 mmol/L (ref 3.5–5.1)
Potassium: 3.7 mmol/L (ref 3.5–5.1)
SODIUM: 143 mmol/L (ref 135–145)
Sodium: 142 mmol/L (ref 135–145)

## 2016-07-13 LAB — GLUCOSE, CAPILLARY
GLUCOSE-CAPILLARY: 133 mg/dL — AB (ref 65–99)
GLUCOSE-CAPILLARY: 112 mg/dL — AB (ref 65–99)
GLUCOSE-CAPILLARY: 64 mg/dL — AB (ref 65–99)
GLUCOSE-CAPILLARY: 215 mg/dL — AB (ref 65–99)
Glucose-Capillary: 194 mg/dL — ABNORMAL HIGH (ref 65–99)
Glucose-Capillary: 267 mg/dL — ABNORMAL HIGH (ref 65–99)
Glucose-Capillary: 268 mg/dL — ABNORMAL HIGH (ref 65–99)
Glucose-Capillary: 319 mg/dL — ABNORMAL HIGH (ref 65–99)
Glucose-Capillary: 57 mg/dL — ABNORMAL LOW (ref 65–99)

## 2016-07-13 LAB — PROCALCITONIN: Procalcitonin: 4.98 ng/mL

## 2016-07-13 LAB — URINE CULTURE: CULTURE: NO GROWTH

## 2016-07-13 LAB — CBC
HEMATOCRIT: 21.8 % — AB (ref 36.0–46.0)
HEMATOCRIT: 22.3 % — AB (ref 36.0–46.0)
Hemoglobin: 7.8 g/dL — ABNORMAL LOW (ref 12.0–15.0)
Hemoglobin: 8 g/dL — ABNORMAL LOW (ref 12.0–15.0)
MCH: 32.8 pg (ref 26.0–34.0)
MCH: 32.9 pg (ref 26.0–34.0)
MCHC: 35.8 g/dL (ref 30.0–36.0)
MCHC: 35.9 g/dL (ref 30.0–36.0)
MCV: 91.6 fL (ref 78.0–100.0)
MCV: 91.8 fL (ref 78.0–100.0)
PLATELETS: 81 10*3/uL — AB (ref 150–400)
Platelets: 85 10*3/uL — ABNORMAL LOW (ref 150–400)
RBC: 2.38 MIL/uL — AB (ref 3.87–5.11)
RBC: 2.43 MIL/uL — ABNORMAL LOW (ref 3.87–5.11)
RDW: 13.5 % (ref 11.5–15.5)
RDW: 13.8 % (ref 11.5–15.5)
WBC: 11.5 10*3/uL — AB (ref 4.0–10.5)
WBC: 13.2 10*3/uL — ABNORMAL HIGH (ref 4.0–10.5)

## 2016-07-13 LAB — ECHOCARDIOGRAM COMPLETE
HEIGHTINCHES: 65 in
WEIGHTICAEL: 2479.73 [oz_av]

## 2016-07-13 LAB — TROPONIN I: Troponin I: 0.65 ng/mL (ref <0.03)

## 2016-07-13 MED ORDER — INSULIN ASPART 100 UNIT/ML ~~LOC~~ SOLN
0.0000 [IU] | Freq: Three times a day (TID) | SUBCUTANEOUS | Status: DC
  Administered 2016-07-13: 11 [IU] via SUBCUTANEOUS
  Administered 2016-07-13: 3 [IU] via SUBCUTANEOUS
  Administered 2016-07-13: 15 [IU] via SUBCUTANEOUS
  Administered 2016-07-14: 3 [IU] via SUBCUTANEOUS
  Administered 2016-07-14: 4 [IU] via SUBCUTANEOUS
  Administered 2016-07-15: 3 [IU] via SUBCUTANEOUS

## 2016-07-13 MED ORDER — DEXTROSE 50 % IV SOLN
INTRAVENOUS | Status: AC
  Administered 2016-07-13: 50 mL
  Filled 2016-07-13: qty 50

## 2016-07-13 MED ORDER — PIPERACILLIN-TAZOBACTAM 3.375 G IVPB
3.3750 g | Freq: Three times a day (TID) | INTRAVENOUS | Status: DC
  Administered 2016-07-13 – 2016-07-14 (×3): 3.375 g via INTRAVENOUS
  Filled 2016-07-13 (×4): qty 50

## 2016-07-13 MED ORDER — INSULIN ASPART 100 UNIT/ML ~~LOC~~ SOLN: 0.0000 [IU] | Freq: Every day | SUBCUTANEOUS | Status: DC

## 2016-07-13 MED ORDER — LACTATED RINGERS IV BOLUS (SEPSIS)
1000.0000 mL | Freq: Once | INTRAVENOUS | Status: AC
  Administered 2016-07-13: 1000 mL via INTRAVENOUS

## 2016-07-13 MED ORDER — INSULIN GLARGINE 100 UNIT/ML ~~LOC~~ SOLN
45.0000 [IU] | Freq: Every day | SUBCUTANEOUS | Status: DC
  Administered 2016-07-13: 45 [IU] via SUBCUTANEOUS
  Filled 2016-07-13 (×2): qty 0.45

## 2016-07-13 NOTE — Progress Notes (Addendum)
Upon assessment, pt's neuro status has changed since yesterday. Disoriented x 4, Incomprehensive speech, Does not follow commands, but  pupils equally reactive, no deficit in motor response. Hemoglobin decrease and new findings were reported  to resident (MD).

## 2016-07-13 NOTE — Consult Note (Signed)
Consultation  Referring Provider: CCM/ Mcquaid  Primary Care Physician:  Hoyt Koch, MD Primary Gastroenterologist:  Dr.Armbruster  Reason for Consultation:  Melena  HPI: Katie Lozano is a 76 y.o. female with history of insulin-dependent adult onset diabetes mellitus, GERD, colon polyps and recent TIA. Patient was on aspirin and Plavix prior to this admission. She was admitted on 07/11/2016 after being found unresponsive at home. She had apparently been ill with a viral gastroenteritis for few days prior.Her sister who is at bedside currently and was to found her states that she had been having nausea vomiting and diarrhea. She had told her that she had vomited up some dark purplish material at one point last week. She had not mentioned having any black stools.  She was found to be in DKA, with acute kidney injury and encephalopathy. Patient also developed shock and has had a non-STEMI. She required pressor support with levo fed which was stopped yesterday. She has not been anticoagulated. Last night she was noted to be borderline hypotensive and was given a fluid bolus. She is now noted to be having black stools, and hemoglobin has dropped from 9.4 yesterday to 7.8 this morning. Her hemoglobin was 9.2 on 07/11/2016. Patient's nurse who has been caring for her since admission says that she has been having black stools from the time of admission and that actually her stool is less dark appearing now been it had been. She has rectal pouch on and is having dark liquid stool. She has not had any vomiting here. Patient herself denies any abdominal pain or nausea.  Prior colonoscopy had been done by Dr. Havery Moros in March 2017 showed severe diverticulosis and she had 3 small tubular adenomatous polyps removed. No prior EGD in [facility].   Past Medical History:  Diagnosis Date  . Allergy   . Colon polyp   . Diabetes mellitus without complication ([place])   . Diverticulitis   . GERD  (gastroesophageal reflux disease)   . Hyperlipidemia     Past Surgical History:  Procedure Laterality Date  . APPENDECTOMY  1956  . CHOLECYSTECTOMY  1997  . TONSILLECTOMY     age 28    Prior to Admission medications   Medication Sig Start Date End Date Taking? Authorizing Provider  aspirin EC 81 MG tablet Take 81 mg by mouth daily.   Yes Historical Provider, MD  clopidogrel (PLAVIX) 75 MG tablet Take 1 tablet (75 mg total) by mouth daily. 06/07/16  Yes Thurnell Lose, MD  Dulaglutide (TRULICITY) [id number] SOPN Inject 0.75 mg into the skin once a week.   Yes Historical Provider, MD  ezetimibe (ZETIA) 10 MG tablet Take 5 mg by mouth daily.    Yes Historical Provider, MD  hydrochlorothiazide (HYDRODIURIL) 25 MG tablet Take 25 mg by mouth daily.   Yes Historical Provider, MD  insulin glargine (LANTUS) 100 UNIT/ML injection Inject into the skin See admin instructions. Inject up to 55 units subcutaneously at bedtime   Yes Historical Provider, MD  Insulin Lispro (HUMALOG KWIKPEN) 200 UNIT/ML SOPN Inject 35 Units into the skin 3 (three) times daily with meals.   Yes Historical Provider, MD  lisinopril (PRINIVIL,ZESTRIL) 10 MG tablet Take 10 mg by mouth daily.   Yes Historical Provider, MD  omeprazole (PRILOSEC OTC) 20 MG tablet Take 20 mg by mouth.   Yes Historical Provider, MD  simvastatin (ZOCOR) 20 MG tablet Take 20 mg by mouth daily.  02/24/15  Yes Historical Provider, MD  Calcium Carb-Cholecalciferol (CALCIUM + D3 PO) Take 1 tablet by mouth daily.     Historical Provider, MD  Cholecalciferol (D3 SUPER STRENGTH) 2000 UNITS CAPS Take by mouth.    Historical Provider, MD  Multiple Vitamin (MULTI VITAMIN DAILY) TABS Take by mouth.    Historical Provider, MD    Current Facility-Administered Medications  Medication Dose Route Frequency Provider Last Rate Last Dose  . insulin aspart (novoLOG) injection 0-20 Units  0-20 Units Subcutaneous TID WC Juliet Rude, MD   11 Units at 07/13/16 1248    . insulin aspart (novoLOG) injection 0-5 Units  0-5 Units Subcutaneous QHS Carly J Rivet, MD      . insulin glargine (LANTUS) injection 45 Units  45 Units Subcutaneous Daily Juliet Rude, MD   45 Units at 07/13/16 0908  . MEDLINE mouth rinse  15 mL Mouth Rinse BID Juanito Doom, MD   15 mL at 07/13/16 0909  . norepinephrine (LEVOPHED) 4 mg in dextrose 5 % 250 mL (0.016 mg/mL) infusion  0-40 mcg/min Intravenous Titrated Juanito Doom, MD   Stopped at 07/12/16 1600  . pantoprazole (PROTONIX) injection 40 mg  40 mg Intravenous Q12H Milagros Loll, MD   40 mg at 07/13/16 0909  . piperacillin-tazobactam (ZOSYN) IVPB 3.375 g  3.375 g Intravenous Q8H Taylor P Stone, RPH      . sodium bicarbonate 75 mEq in sodium chloride 0.45 % 1,000 mL infusion   Intravenous Continuous Juanito Doom, MD 75 mL/hr at 07/13/16 0230      Allergies as of 07/11/2016  . (No Known Allergies)    Family History  Problem Relation Age of Onset  . Heart disease Mother   . Melanoma Mother   . Heart disease Father     MI  . Diabetes Father   . Colon cancer Maternal Uncle   . Colon polyps Maternal Uncle     Social History   Social History  . Marital status: Legally Separated    Spouse name: N/A  . Number of children: 2  . Years of education: N/A   Occupational History  . retired    Social History Main Topics  . Smoking status: Former Smoker    Types: Cigarettes    Quit date: 06/29/2012  . Smokeless tobacco: Never Used  . Alcohol use No  . Drug use: No  . Sexual activity: Not on file   Other Topics Concern  . Not on file   Social History Narrative  . No narrative on file    Review of Systems: Pertinent positive and negative review of systems were noted in the above HPI section.  All other review of systems was otherwise negative. Physical Exam: Vital signs in last 24 hours: Temp:  [97.8 F (36.6 C)-100.9 F (38.3 C)] 98.5 F (36.9 C) (01/14 1244) Pulse Rate:  [96-117] 103 (01/14  1200) Resp:  [20-41] 26 (01/14 1200) BP: (65-125)/(29-93) 99/78 (01/14 1200) SpO2:  [91 %-100 %] 99 % (01/14 1200) Weight:  [154 lb 15.7 oz (70.3 kg)] 154 lb 15.7 oz (70.3 kg) (01/14 0500) Last BM Date: 07/12/16 General:   Alert,  Well-developed, well-nourished, Ill-appearing elderly white female alert and able to speak a few words  Head:  Normocephalic and atraumatic. Eyes:  Sclera clear, no icterus.   Conjunctiva pink. Ears:  Normal auditory acuity. Nose:  No deformity, discharge,  or lesions. Mouth:  No deformity or lesions.   Neck:  Supple; no masses or thyromegaly. Lungs:  Clear throughout to auscultation.   No wheezes, crackles, or rhonchi. Heart:  Regular rate and rhythm; no murmurs, clicks, rubs,  or gallops. Abdomen:  Soft,nontender, large, BS active,nonpalp mass or hsm.   Rectal:  Deferred -rectal pouch with very dark black liquid stool, no obvious blood Msk:  Symmetrical without gross deformities. . Pulses:  Normal pulses noted. Extremities:  Without clubbing or edema. Neurologic:  Alert and  oriented x2  grossly normal neurologically. Skin:  Intact without significant lesions or rashes.. Psych:  Alert and cooperative.   Intake/Output from previous day: 01/13 0701 - 01/14 0700 In: 2788.8 [I.V.:1638.8; IV Piggyback:1150] Out: 2230 [Urine:2005; Stool:225] Intake/Output this shift: Total I/O In: 375 [I.V.:375] Out: 495 [Urine:470; Stool:25]  Lab Results:  Recent Labs  07/12/16 0804 07/12/16 1800 07/13/16 0447  WBC 16.4* 13.5* 11.5*  HGB 9.3* 9.4* 7.8*  HCT 26.8* 26.1* 21.8*  PLT 163 124* 81*   BMET  Recent Labs  07/12/16 1800 07/13/16 0000 07/13/16 0447  NA 143 143 142  K 3.7 3.7 3.5  CL 118* 117* 114*  CO2 15* 16* 16*  GLUCOSE 285* 305* 337*  BUN 63* 52* 44*  CREATININE 1.43* 1.38* 1.25*  CALCIUM 7.5* 7.4* 7.3*   LFT  Recent Labs  07/11/16 1511  PROT 5.9*  ALBUMIN 2.7*  AST 63*  ALT 29  ALKPHOS 85  BILITOT 1.2   PT/INR No results for  input(s): LABPROT, INR in the last 72 hours. Hepatitis Panel No results for input(s): HEPBSAG, HCVAB, HEPAIGM, HEPBIGM in the last 72 hours.   IMPRESSION:   #20 76 year old white female admitted 07/11/2016 after being found unresponsive at home. Apparently patient had been ill with what she thought was a viral gastroenteritis for a few days prior to admission. #2 DKA #3 shock-resolved, off pressors #4 non-STEMI #5 acute kidney injury #6 recent TIA-patient had been on aspirin and Plavix prior to admission  #7 subacute GI bleeding with melena. Patient may have been having some GI blood loss prior to admission with description of nausea vomiting and diarrhea Rule out aspirin/Plavix induced gastropathy or peptic ulcer disease, rule out possible esophagitis with history of nausea and vomiting #8 anemia-secondary to above #9 history of diverticulosis and adenomatous colon polyps last colonoscopy March 2017  Plan; Continue twice a day PPI IV Serial  hemoglobins and transfuse for hemoglobin 7 her last Aspirin and Plavix have been held since admission Will schedule for EGD with Dr. Havery Moros tomorrow 07/14/2016 at bedside. I spoke with patient and her sister, discussed EGD in detail  and they are agreeable to proceed.     Katie Lozano  07/13/2016, 12:54 PM

## 2016-07-13 NOTE — Progress Notes (Signed)
eLink Physician-Brief Progress Note Patient Name: Katie Lozano DOB: [birth year] MRN: [id number]   Date of Service  07/13/2016  HPI/Events of Note  Notified of borderline hypotension. CVP less than goal. Remains off vasopressors.   eICU Interventions  LR 1 liter IV fluid bolus over one hour       Intervention Category Major Interventions: Hypotension - evaluation and management  Tera Partridge 07/13/2016, 2:38 AM

## 2016-07-13 NOTE — Progress Notes (Signed)
Hypoglycemic Event  CBG: 57  Treatment: D50 IV 50 mL  Symptoms: Shaky  Follow-up CBG: Time:2250 CBG Result:212  Possible Reasons for Event: Inadequate meal intake  Comments/MD notified: Pt has decreased appetite and not taking in adequate PO food/drink . Will continue to monitor blood sugars.    Katie Lozano M D'Adamo

## 2016-07-13 NOTE — Progress Notes (Signed)
PULMONARY / CRITICAL CARE MEDICINE   Name: Katie Lozano MRN: [id number] DOB: Jul 28, 1940    ADMISSION DATE:  07/11/2016 CONSULTATION DATE:  1/12  REFERRING MD:  Verta Ellen  CHIEF COMPLAINT:  Acute encephalopathy   BRIEF:  76 y/o female with DM2 and recent TIA who was admitted on 1/13 with acute encephalopathy from DKA after a viral gastroenteritis.  This has been associated with shock, NSTEMI and lactic acidosis.   SUBJECTIVE:  Nurse reports more lethargic and saying nonsensical things this morning. Off Levophed, BPs stable. Still having dark stools per RN.    VITAL SIGNS: BP (!) 112/47   Pulse (!) 102   Temp 100 F (37.8 C) (Oral)   Resp (!) 27   Ht 5\' 5"  (1.651 m)   Wt 154 lb 15.7 oz (70.3 kg)   SpO2 98%   BMI 25.79 kg/m   HEMODYNAMICS: CVP:  [0 mmHg-4 mmHg] 4 mmHg  VENTILATOR SETTINGS:    INTAKE / OUTPUT: I/O last 3 completed shifts: In: 6628.5 [I.V.:4113.5; IV [id number] Out: [id number] [Urine:4280; Stool:325]  PHYSICAL EXAMINATION: General:  Chronically ill appearing elderly woman resting in bed HENT: NCAT, EOMI, PERRL, mucus membranes moist PULM: CTA bilaterally CV: RRR, no m/g/r GI: BS+, soft, tenderness diffusely, non-distended  MSK: normal bulk and tone Neuro: sleepy but arouses to voice, oriented to self only  LABS:  BMET  Recent Labs Lab 07/12/16 1800 07/13/16 0000 07/13/16 0447  NA 143 143 142  K 3.7 3.7 3.5  CL 118* 117* 114*  CO2 15* 16* 16*  BUN 63* 52* 44*  CREATININE 1.43* 1.38* 1.25*  GLUCOSE 285* 305* 337*    Electrolytes  Recent Labs Lab 07/11/16 1642  07/12/16 1800 07/13/16 0000 07/13/16 0447  CALCIUM 8.1*  < > 7.5* 7.4* 7.3*  MG 2.6*  --   --   --   --   PHOS 12.4*  --   --   --   --   < > = values in this interval not displayed.  CBC  Recent Labs Lab 07/12/16 0804 07/12/16 1800 07/13/16 0447  WBC 16.4* 13.5* 11.5*  HGB 9.3* 9.4* 7.8*  HCT 26.8* 26.1* 21.8*  PLT 163 124* 81*    Coag's No results for  input(s): APTT, INR in the last 168 hours.  Sepsis Markers  Recent Labs Lab 07/11/16 1642 07/11/16 2200 07/12/16 0010 07/12/16 1243 07/13/16 0447  LATICACIDVEN 9.9* 3.5* 3.0*  --   --   PROCALCITON  --   --   --  7.79 4.98    ABG  Recent Labs Lab 07/11/16 1459  PHART 6.860*  PCO2ART 34.5  PO2ART 205.0*    Liver Enzymes  Recent Labs Lab 07/11/16 1511  AST 63*  ALT 29  ALKPHOS 85  BILITOT 1.2  ALBUMIN 2.7*    Cardiac Enzymes  Recent Labs Lab 07/12/16 1243 07/12/16 1837 07/13/16 0025  TROPONINI 0.91* 0.82* 0.65*    Glucose  Recent Labs Lab 07/12/16 1236 07/12/16 1345 07/12/16 1602 07/12/16 2032 07/12/16 2342 07/13/16 0334  GLUCAP 182* 171* 196* 216* 270* 268*    Imaging No results found.   STUDIES:   CULTURES: BC x 2 1/12>>> 1/2 aerococcus  UC 1/12>>>  ANTIBIOTICS: vanc 1/12>> 1/13 Zosyn 1/12>>>  SIGNIFICANT EVENTS:   LINES/TUBES:   DISCUSSION: 76 yof w/ probable dka and sepsis vs hypovolemic shock. Favor hypovolemia. Admitting under DKA protocol. Central access placed.   ASSESSMENT / PLAN:  PULMONARY A: No acute issues P:   Monitor  O2 saturation  CARDIOVASCULAR A:  Shock persists, uncertain etiology; doubt sepsis, suspect hypovolemic but appears euvolemic on exam; ddx cardiogenic vs less likely septic NSTEMI P:  Monitor CVP Off Levophed  Echo Troponin peaked at 0.82, no need to trend further Hold ASA/anticoagulation with possible GI bleed  RENAL A:   AKI> improving Lactic acidosis > improving Hyperkalemia  > resolved DKA Hyponatremia > resolved Non-anion gap acidosis on 1/13> due to saline resuscitation P:   1/2 NS with sodium bicarbonate bmet in AM Monitor BMET and UOP Replace electrolytes as needed   GASTROINTESTINAL A:   Recent Gastroenteritis +FOBT, drop in Hgb (9.4>7.8), concern for GI bleed  Hx severe diverticulosis, s/p removal 3 tubular adenomas, sm int hemorrhoids (per colo in March  2017) P:   Repeat CBC this afternoon Continue PPI bid for now Clear diet  Consult GI   HEMATOLOGIC A:   Anemia  Thrombocytopenia  P:  Hold sub q heparin Transfuse for Hgb < 7.0  INFECTIOUS A:   Aerococcus bacteremia> likely contaminant Doubt Sepsis, no clear evidence of infection  Recent viral gastroenteritis P:   Follow cultures Continue zosyn for now  ENDOCRINE A:   DKA, ketones in urine P:   Increase Lantus to 45 units Change to resistant SSI Diabetic diet  NEUROLOGIC A:   Acute encephalopathy P:   RASS goal: 0 Holding sedating meds   FAMILY  - Updates: Sister and brother updated bedside 1/13  - Inter-disciplinary family meet or Palliative Care meeting due by:1/19   Albin Felling, MD, MPH Internal Medicine Resident, PGY-III Pager: [phone] 07/13/2016, 7:53 AM

## 2016-07-13 NOTE — Progress Notes (Signed)
Reassessment done. Pt responds to voice, oriented to name only, follows commands. MD at the bedside.

## 2016-07-13 NOTE — Progress Notes (Signed)
Pharmacy Antibiotic Note  Katie Lozano is a 76 y.o. female admitted on 07/11/2016 with sepsis.  Pharmacy has been consulted for zosyn dosing. Patient with improving renal function, SCr 1.25 today, close to baseline.   Plan: - Change zosyn to 3.375g IV q8h  - Monitor C&S, CBC and clinical progression - Monitor renal function  Height: 5\' 5"  (165.1 cm) Weight: 154 lb 15.7 oz (70.3 kg) IBW/kg (Calculated) : 57  Temp (24hrs), Avg:99.1 F (37.3 C), Min:97.8 F (36.6 C), Max:100.9 F (38.3 C)   Recent Labs Lab 07/11/16 1515 07/11/16 1642  07/11/16 2200 07/12/16 0010 07/12/16 0148 07/12/16 0400 07/12/16 0803 07/12/16 0804 07/12/16 1800 07/13/16 0000 07/13/16 0447  WBC  --  23.3*  --   --   --  19.4*  --   --  16.4* 13.5*  --  11.5*  CREATININE 2.40* 3.39*  < >  --  2.77*  --  2.44* 2.08*  --  1.43* 1.38* 1.25*  LATICACIDVEN 13.76* 9.9*  --  3.5* 3.0*  --   --   --   --   --   --   --   < > = values in this interval not displayed.  Estimated Creatinine Clearance: 37.1 mL/min (by C-G formula based on SCr of 1.25 mg/dL (H)).    No Known Allergies  Antimicrobials this admission: 1/12 vancomycin > 1/13 1/12 zosyn >  Dose adjustments this admission: 1/14 Zosyn dose increased to 3.375g IV q8h  Microbiology results: 1/12 blood cx: Aerococcus (1/2) - likely contaminant 1/12 urine cx: no growth 1/12 MRSA PCR: neg  Thank you for allowing pharmacy to be a part of this patient's care.  Dimitri Ped, PharmD, BCPS PGY-2 Infectious Diseases Pharmacy Resident Pager: [phone] 07/13/2016 9:49 AM

## 2016-07-13 NOTE — Progress Notes (Signed)
Echocardiogram 2D Echocardiogram has been performed.  Joelene Millin 07/13/2016, 3:19 PM

## 2016-07-14 ENCOUNTER — Encounter (HOSPITAL_COMMUNITY): Payer: Self-pay | Admitting: Certified Registered Nurse Anesthetist

## 2016-07-14 ENCOUNTER — Encounter (HOSPITAL_COMMUNITY)
Admission: EM | Disposition: A | Discharge status: Skilled Nursing Facility | Payer: Self-pay | Source: Home / Self Care | Attending: Family Medicine

## 2016-07-14 ENCOUNTER — Inpatient Hospital Stay (HOSPITAL_COMMUNITY): Payer: PPO | Admitting: Certified Registered Nurse Anesthetist

## 2016-07-14 HISTORY — PX: ESOPHAGOGASTRODUODENOSCOPY: SHX5428

## 2016-07-14 LAB — BASIC METABOLIC PANEL
ANION GAP: 9 (ref 5–15)
BUN: 25 mg/dL — ABNORMAL HIGH (ref 6–20)
CALCIUM: 7.3 mg/dL — AB (ref 8.9–10.3)
CO2: 26 mmol/L (ref 22–32)
Chloride: 113 mmol/L — ABNORMAL HIGH (ref 101–111)
Creatinine, Ser: 0.88 mg/dL (ref 0.44–1.00)
GFR calc Af Amer: >60 mL/min (ref >60)
GFR calc non Af Amer: >60 mL/min (ref >60)
GLUCOSE: 202 mg/dL — AB (ref 65–99)
Potassium: 2.5 mmol/L — CL (ref 3.5–5.1)
Sodium: 148 mmol/L — ABNORMAL HIGH (ref 135–145)

## 2016-07-14 LAB — GLUCOSE, CAPILLARY
GLUCOSE-CAPILLARY: 148 mg/dL — AB (ref 65–99)
GLUCOSE-CAPILLARY: 92 mg/dL (ref 65–99)
Glucose-Capillary: 188 mg/dL — ABNORMAL HIGH (ref 65–99)
Glucose-Capillary: 194 mg/dL — ABNORMAL HIGH (ref 65–99)
Glucose-Capillary: 229 mg/dL — ABNORMAL HIGH (ref 65–99)
Glucose-Capillary: 185 mg/dL — ABNORMAL HIGH (ref 65–99)
Glucose-Capillary: 69 mg/dL (ref 65–99)

## 2016-07-14 LAB — POTASSIUM: Potassium: 3.2 mmol/L — ABNORMAL LOW (ref 3.5–5.1)

## 2016-07-14 LAB — PHOSPHORUS
Phosphorus: <1 mg/dL — CL (ref 2.5–4.6)
Phosphorus: 1.6 mg/dL — ABNORMAL LOW (ref 2.5–4.6)

## 2016-07-14 LAB — CBC
HEMATOCRIT: 21.5 % — AB (ref 36.0–46.0)
Hemoglobin: 7.6 g/dL — ABNORMAL LOW (ref 12.0–15.0)
MCH: 32.5 pg (ref 26.0–34.0)
MCHC: 35.3 g/dL (ref 30.0–36.0)
MCV: 91.9 fL (ref 78.0–100.0)
Platelets: 78 10*3/uL — ABNORMAL LOW (ref 150–400)
RBC: 2.34 MIL/uL — ABNORMAL LOW (ref 3.87–5.11)
RDW: 13.7 % (ref 11.5–15.5)
WBC: 11.6 10*3/uL — AB (ref 4.0–10.5)

## 2016-07-14 LAB — PROCALCITONIN: Procalcitonin: 1.79 ng/mL

## 2016-07-14 LAB — MAGNESIUM
MAGNESIUM: 1.5 mg/dL — AB (ref 1.7–2.4)
Magnesium: 2.4 mg/dL (ref 1.7–2.4)

## 2016-07-14 SURGERY — EGD (ESOPHAGOGASTRODUODENOSCOPY)
Anesthesia: Monitor Anesthesia Care

## 2016-07-14 MED ORDER — POTASSIUM PHOSPHATES 15 MMOLE/5ML IV SOLN
30.0000 mmol | Freq: Once | INTRAVENOUS | Status: AC
  Administered 2016-07-14: 30 mmol via INTRAVENOUS
  Filled 2016-07-14: qty 10

## 2016-07-14 MED ORDER — SODIUM CHLORIDE 0.9 % IV SOLN: 30.0000 meq | Freq: Once | INTRAVENOUS | Status: DC

## 2016-07-14 MED ORDER — SODIUM CHLORIDE 0.9 % IV SOLN
4.0000 g | Freq: Once | INTRAVENOUS | Status: AC
  Administered 2016-07-14: 4 g via INTRAVENOUS
  Filled 2016-07-14: qty 8

## 2016-07-14 MED ORDER — SODIUM PHOSPHATES 45 MMOLE/15ML IV SOLN: 30.0000 mmol | Freq: Once | INTRAVENOUS | Status: DC

## 2016-07-14 MED ORDER — DEXTROSE 50 % IV SOLN
INTRAVENOUS | Status: AC
  Administered 2016-07-14: 50 mL
  Filled 2016-07-14: qty 50

## 2016-07-14 MED ORDER — SODIUM CHLORIDE 0.9 % IV SOLN
8.0000 mg/h | INTRAVENOUS | Status: DC
  Administered 2016-07-14 – 2016-07-17 (×7): 8 mg/h via INTRAVENOUS
  Filled 2016-07-14 (×12): qty 80

## 2016-07-14 MED ORDER — INSULIN GLARGINE 100 UNIT/ML ~~LOC~~ SOLN
42.0000 [IU] | Freq: Every day | SUBCUTANEOUS | Status: DC
  Administered 2016-07-14: 42 [IU] via SUBCUTANEOUS
  Filled 2016-07-14 (×2): qty 0.42

## 2016-07-14 MED ORDER — PROPOFOL 500 MG/50ML IV EMUL
INTRAVENOUS | Status: DC | PRN
  Administered 2016-07-14: 120 ug/kg/min via INTRAVENOUS

## 2016-07-14 MED ORDER — SODIUM CHLORIDE 0.9 % IV SOLN
INTRAVENOUS | Status: DC | PRN
  Administered 2016-07-14: 14:00:00 via INTRAVENOUS

## 2016-07-14 MED ORDER — SODIUM CHLORIDE 0.9 % IV SOLN
30.0000 meq | INTRAVENOUS | Status: AC
  Administered 2016-07-14 (×3): 30 meq via INTRAVENOUS
  Filled 2016-07-14 (×3): qty 15

## 2016-07-14 MED ORDER — PROPOFOL 10 MG/ML IV BOLUS
INTRAVENOUS | Status: DC | PRN
  Administered 2016-07-14 (×2): 20 mg via INTRAVENOUS

## 2016-07-14 MED ORDER — POTASSIUM CHLORIDE CRYS ER 20 MEQ PO TBCR: 40.0000 meq | EXTENDED_RELEASE_TABLET | ORAL | Status: DC

## 2016-07-14 NOTE — Anesthesia Postprocedure Evaluation (Addendum)
Anesthesia Post Note  Patient: Katie Lozano  Procedure(s) Performed: Procedure(s) (LRB): ESOPHAGOGASTRODUODENOSCOPY (EGD) (N/A)  Patient location during evaluation: Endoscopy Anesthesia Type: MAC Level of consciousness: awake and alert Pain management: pain level controlled Vital Signs Assessment: post-procedure vital signs reviewed and stable Respiratory status: spontaneous breathing and respiratory function stable Cardiovascular status: stable Anesthetic complications: no       Last Vitals:  Vitals:   07/14/16 1340 07/14/16 1401  BP: (!) 151/55 (!) 102/38  Pulse: 89 85  Resp: 18 19  Temp:  36.5 C    Last Pain:  Vitals:   07/14/16 1235  TempSrc: Oral  PainSc:                  Jehan Ranganathan DANIEL

## 2016-07-14 NOTE — H&P (View-Only) (Signed)
Daily Rounding Note  07/14/2016, 10:41 AM  LOS: 3 days   SUBJECTIVE:   Chief complaint:  Black stool.  N/v.  No further n/v.  Reddish cast to stool is resolved now is watery and dark brown.  Denies abd pain      OBJECTIVE:         Vital signs in last 24 hours:    Temp:  [97.5 F (36.4 C)-100.2 F (37.9 C)] 98.2 F (36.8 C) (01/15 0726) Pulse Rate:  [83-103] 83 (01/15 1000) Resp:  [14-33] 19 (01/15 1000) BP: (92-152)/(41-107) 145/57 (01/15 1000) SpO2:  [90 %-100 %] 96 % (01/15 1000) Last BM Date: 07/13/16 Filed Weights   07/11/16 2000 07/13/16 0500  Weight: 71.2 kg (156 lb 15.5 oz) 70.3 kg (154 lb 15.7 oz)   General: pleasant, alert, comfortable, does not look acutely ill   Heart: RRR.  No MRG Chest: clear bil.  No dyspnea Abdomen: soft, NT, ND.  BS active.  Flexiseal catheter with deep brown watery material, not reddish/maroon  Extremities: no CCE Neuro/Psych:  Alert,  Psycho-motor slowing.  Some UE tremors.  Oriented to place, year.    Intake/Output from previous day: 01/14 0701 - 01/15 0700 In: 2945.1 [P.O.:120; I.V.:1900.1; IV Piggyback:925] Out: 2260 [ZOXWR:6045; Stool:505]  Intake/Output this shift: No intake/output data recorded.  Lab Results:  Recent Labs  07/13/16 0447 07/13/16 1258 07/14/16 0350  WBC 11.5* 13.2* 11.6*  HGB 7.8* 8.0* 7.6*  HCT 21.8* 22.3* 21.5*  PLT 81* 85* 78*   BMET  Recent Labs  07/13/16 0000 07/13/16 0447 07/14/16 0350  NA 143 142 148*  K 3.7 3.5 2.5*  CL 117* 114* 113*  CO2 16* 16* 26  GLUCOSE 305* 337* 202*  BUN 52* 44* 25*  CREATININE 1.38* 1.25* 0.88  CALCIUM 7.4* 7.3* 7.3*   LFT  Recent Labs  07/11/16 1511  PROT 5.9*  ALBUMIN 2.7*  AST 63*  ALT 29  ALKPHOS 85  BILITOT 1.2   PT/INR No results for input(s): LABPROT, INR in the last 72 hours. Hepatitis Panel No results for input(s): HEPBSAG, HCVAB, HEPAIGM, HEPBIGM in the last 72  hours.  Studies/Results: No results found.   Scheduled Meds: . insulin aspart  0-20 Units Subcutaneous TID WC  . insulin aspart  0-5 Units Subcutaneous QHS  . insulin glargine  42 Units Subcutaneous Daily  . magnesium sulfate LVP 250-500 ml  4 g Intravenous Once  . mouth rinse  15 mL Mouth Rinse BID  . pantoprazole (PROTONIX) IV  40 mg Intravenous Q12H  . potassium chloride (KCL MULTIRUN) 30 mEq in 265 mL IVPB  30 mEq Intravenous Q3H  . potassium phosphate IVPB (mmol)  30 mmol Intravenous Once   Continuous Infusions: . norepinephrine (LEVOPHED) Adult infusion Stopped (07/12/16 1600)   PRN Meds:.   ASSESMENT:   *  Melena, first noted at admission..  Dark emesis in setting of possible viral gastroenteritis last week. Improved, less obviously melenic stool currently.    *  Blood loss anemia.  No transfusion to date.  *  Thrombocytopenia.  New this admission.  *  Shock, improved. Non-STEMI.  Pressors, now discontinued..    *  DKA.  *  AKI.  Resolved.       *  Hypokalemia. S/p 3 runs of potassium 30 mEq each.   *  Chronic Plavix and ASA for recent TIA 06/05/16, on hold for last 4 days..   *  Severe  diverticulosis, TVAs x 3 on 08/2015 colonoscopy.   PLAN   *  Can probably have bedside EGD this early afternoon.  Will confirm with MD Dr Havery Moros.  If the potassium returns normal, could possibly do case in Endo with MAC.      Katie Lozano  07/14/2016, 10:41 AM Pager: [phone]

## 2016-07-14 NOTE — Progress Notes (Signed)
PULMONARY / CRITICAL CARE MEDICINE   Name: Katie Lozano MRN: [id number] DOB: August 06, 1940    ADMISSION DATE:  07/11/2016 CONSULTATION DATE:  1/12  REFERRING MD:  Verta Ellen  CHIEF COMPLAINT:  Acute encephalopathy   BRIEF:  76 y/o female with DM2 and recent TIA who was admitted on 1/13 with acute encephalopathy from DKA after a viral gastroenteritis.  This has been associated with shock, NSTEMI and lactic acidosis.   SUBJECTIVE:  Hemoglobin remains stable. Stool output overnight 200 cc melena. Patient denies abdominal pain. More awake and oriented this morning.    VITAL SIGNS: BP 118/63   Pulse 92   Temp 98.6 F (37 C) (Oral)   Resp (!) 21   Ht 5\' 5"  (1.651 m)   Wt 154 lb 15.7 oz (70.3 kg)   SpO2 97%   BMI 25.79 kg/m   HEMODYNAMICS: CVP:  [1 mmHg-4 mmHg] 3 mmHg  VENTILATOR SETTINGS:    INTAKE / OUTPUT: I/O last 3 completed shifts: In: 3883.8 [I.V.:2683.8; IV Piggyback:1200] Out: [id number] [Urine:3025; Stool:525]  PHYSICAL EXAMINATION: General:  Chronically ill appearing elderly woman resting in bed HENT: NCAT, EOMI, PERRL, mucus membranes moist PULM: CTA bilaterally CV: RRR, no m/g/r GI: BS+, soft, non-tender, non-distended  MSK: normal bulk and tone Neuro: awake, oriented to person and place. Follows commands.  LABS:  BMET  Recent Labs Lab 07/13/16 0000 07/13/16 0447 07/14/16 0350  NA 143 142 148*  K 3.7 3.5 2.5*  CL 117* 114* 113*  CO2 16* 16* 26  BUN 52* 44* 25*  CREATININE 1.38* 1.25* 0.88  GLUCOSE 305* 337* 202*    Electrolytes  Recent Labs Lab 07/11/16 1642  07/13/16 0000 07/13/16 0447 07/14/16 0350  CALCIUM 8.1*  < > 7.4* 7.3* 7.3*  MG 2.6*  --   --   --  1.5*  PHOS 12.4*  --   --   --  <1.0*  < > = values in this interval not displayed.  CBC  Recent Labs Lab 07/13/16 0447 07/13/16 1258 07/14/16 0350  WBC 11.5* 13.2* 11.6*  HGB 7.8* 8.0* 7.6*  HCT 21.8* 22.3* 21.5*  PLT 81* 85* 78*    Coag's No results for input(s):  APTT, INR in the last 168 hours.  Sepsis Markers  Recent Labs Lab 07/11/16 1642 07/11/16 2200 07/12/16 0010 07/12/16 1243 07/13/16 0447 07/14/16 0350  LATICACIDVEN 9.9* 3.5* 3.0*  --   --   --   PROCALCITON  --   --   --  7.79 4.98 1.79    ABG  Recent Labs Lab 07/11/16 1459  PHART 6.860*  PCO2ART 34.5  PO2ART 205.0*    Liver Enzymes  Recent Labs Lab 07/11/16 1511  AST 63*  ALT 29  ALKPHOS 85  BILITOT 1.2  ALBUMIN 2.7*    Cardiac Enzymes  Recent Labs Lab 07/12/16 1243 07/12/16 1837 07/13/16 0025  TROPONINI 0.91* 0.82* 0.65*    Glucose  Recent Labs Lab 07/13/16 1935 07/13/16 2206 07/13/16 2208 07/13/16 2252 07/13/16 2347 07/14/16 0422  GLUCAP 112* 64* 57* 215* 194* 188*    Imaging No results found.   STUDIES:   CULTURES: BC x 2 1/12>>> 1/2 aerococcus  UC 1/12>>> No growth  ANTIBIOTICS: vanc 1/12>> 1/13 Zosyn 1/12>>>  SIGNIFICANT EVENTS:   LINES/TUBES:   DISCUSSION: 26 yof w/ probable dka and sepsis vs hypovolemic shock. Favor hypovolemia. Admitting under DKA protocol. Central access placed.   ASSESSMENT / PLAN:  PULMONARY A: No acute issues P:   Monitor O2  saturation  CARDIOVASCULAR A:  Shock persists, uncertain etiology; doubt sepsis, suspect hypovolemic but appears euvolemic on exam; ddx cardiogenic vs less likely septic- resolving NSTEMI Grade 1 diastolic dysfunction per echo P:  Monitor CVP Hold ASA/anticoagulation with GI bleed  RENAL A:   AKI> resolved Lactic acidosis >resolved Hyperkalemia  > resolved DKA> resolved Hyponatremia > resolved Non-anion gap acidosis on 1/13> due to saline resuscitation> resolved Hypokalemia Hypophosphatemia Hypomagnesemia  P:   Stop 1/2 NS with sodium bicarbonate Check K, phos, and Mg this afternoon Monitor BMET and UOP Replace electrolytes   GASTROINTESTINAL A:   Recent Gastroenteritis +FOBT, drop in Hgb (9.4>7.8), concern for GI bleed  Hx severe diverticulosis,  s/p removal 3 tubular adenomas, sm int hemorrhoids (per colo in March 2017) P:   Possible EGD this afternoon Continue PPI bid for now NPO GI following, appreciate recommendations  HEMATOLOGIC A:   Anemia  Thrombocytopenia  P:  Hold sub q heparin Transfuse for Hgb < 7.0  INFECTIOUS A:   Aerococcus bacteremia> likely contaminant Doubt Sepsis, no clear evidence of infection  Recent viral gastroenteritis P:   Follow cultures Consider discontinuing Zosyn  ENDOCRINE A:   DKA, ketones in urine> resolved 1 episode hypoglycemia overnight P:   Decrease Lantus to 42 units Continue resistant SSI NPO for now, advance to clears when able   NEUROLOGIC A:   Acute encephalopathy- steadily improving  P:   RASS goal: 0 Holding sedating meds   FAMILY  - Updates: Sister updated bedside 1/14  - Inter-disciplinary family meet or Palliative Care meeting due by:1/19   Albin Felling, MD, MPH Internal Medicine Resident, PGY-III Pager: [phone] 07/14/2016, 6:45 AM

## 2016-07-14 NOTE — Op Note (Addendum)
[facility] Patient Name: Katie Lozano Procedure Date : 07/14/2016 MRN: [id number] Attending MD: Carlota Raspberry. Varun Jourdan MD, MD Date of Birth: 1940-12-31 CSN: [id number] Age: 76 Admit Type: Inpatient Procedure:                Upper GI endoscopy Indications:              Melena in the setting of DKA, shock, NSTEMI, AKI Providers:                Remo Lipps P. Mazy Culton MD, MD, Carmie End, RN,                            William Dalton, Technician Referring MD:              Medicines:                Monitored Anesthesia Care Complications:            No immediate complications. Estimated blood loss:                            Minimal. Estimated Blood Loss:     Estimated blood loss was minimal. Procedure:                Pre-Anesthesia Assessment:                           - Prior to the procedure, a History and Physical                            was performed, and patient medications and                            allergies were reviewed. The patient's tolerance of                            previous anesthesia was also reviewed. The risks                            and benefits of the procedure and the sedation                            options and risks were discussed with the patient.                            All questions were answered, and informed consent                            was obtained. Prior Anticoagulants: The patient has                            taken Plavix (clopidogrel), last dose was 4 days                            prior to procedure. ASA Grade Assessment: III - A  patient with severe systemic disease. After                            reviewing the risks and benefits, the patient was                            deemed in satisfactory condition to undergo the                            procedure.                           After obtaining informed consent, the endoscope was                            passed under direct  vision. Throughout the                            procedure, the patient's blood pressure, pulse, and                            oxygen saturations were monitored continuously. The                            [id number]-[id number] ([id number]) scope was introduced through the                            mouth, and advanced to the second part of duodenum.                            The upper GI endoscopy was accomplished without                            difficulty. The patient tolerated the procedure                            well. Scope In: Scope Out: Findings:      Esophagogastric landmarks were identified: the Z-line was found at 33       cm, the gastroesophageal junction was found at 33 cm and the upper       extent of the gastric folds was found at 36 cm from the incisors.      A 3 cm hiatal hernia was present.      Severe ulcerative esophagitis, diffusely darkened and consistent with       "black esophagus", was found 22 to 33 cm from the incisors. No active       bleeding was appreciated.      The proximal esophagus was otherwise normal.      Multiple dispersed, small non-bleeding erosions were found in the       gastric body without high risk stigmata for bleeding.      The exam of the stomach was otherwise normal.      The duodenal bulb and second portion of the duodenum were normal. Impression:               - Esophagogastric landmarks identified.                           -  3 cm hiatal hernia.                           - Severe esophagitis as outlined above, findings                            concerning for acute esophageal necrosis or "black                            esophagus", which is the likely cause for the                            patient's symptoms                           - Non-bleeding erosive gastropathy.                           - Normal duodenal bulb and second portion of the                            duodenum. Moderate Sedation:      No moderate sedation, case  performed with MAC Recommendation:           - Return patient to ICU for ongoing care.                           - NPO for now.                           - Continue present medications.                           - Protonix drip                           - Avoid hypotension                           - GI service will continue to follow. Further                            recommendations in consult note Procedure Code(s):        --- Professional ---                           [phone], Esophagogastroduodenoscopy, flexible,                            transoral; diagnostic, including collection of                            specimen(s) by brushing or washing, when performed                            (separate procedure) Diagnosis Code(s):        --- Professional ---  K44.9, Diaphragmatic hernia without obstruction or                            gangrene                           K20.9, Esophagitis, unspecified                           K31.89, Other diseases of stomach and duodenum                           K92.1, Melena (includes Hematochezia) CPT copyright 2016 American Medical Association. All rights reserved. The codes documented in this report are preliminary and upon coder review may  be revised to meet current compliance requirements. Remo Lipps P. Latarsha Zani MD, MD 07/14/2016 2:10:17 PM This report has been signed electronically. Number of Addenda: 0

## 2016-07-14 NOTE — Progress Notes (Signed)
Daily Rounding Note  07/14/2016, 10:41 AM  LOS: 3 days   SUBJECTIVE:   Chief complaint:  Black stool.  N/v.  No further n/v.  Reddish cast to stool is resolved now is watery and dark brown.  Denies abd pain      OBJECTIVE:         Vital signs in last 24 hours:    Temp:  [97.5 F (36.4 C)-100.2 F (37.9 C)] 98.2 F (36.8 C) (01/15 0726) Pulse Rate:  [83-103] 83 (01/15 1000) Resp:  [14-33] 19 (01/15 1000) BP: (92-152)/(41-107) 145/57 (01/15 1000) SpO2:  [90 %-100 %] 96 % (01/15 1000) Last BM Date: 07/13/16 Filed Weights   07/11/16 2000 07/13/16 0500  Weight: 71.2 kg (156 lb 15.5 oz) 70.3 kg (154 lb 15.7 oz)   General: pleasant, alert, comfortable, does not look acutely ill   Heart: RRR.  No MRG Chest: clear bil.  No dyspnea Abdomen: soft, NT, ND.  BS active.  Flexiseal catheter with deep brown watery material, not reddish/maroon  Extremities: no CCE Neuro/Psych:  Alert,  Psycho-motor slowing.  Some UE tremors.  Oriented to place, year.    Intake/Output from previous day: 01/14 0701 - 01/15 0700 In: 2945.1 [P.O.:120; I.V.:1900.1; IV Piggyback:925] Out: 2260 [WUXLK:4401; Stool:505]  Intake/Output this shift: No intake/output data recorded.  Lab Results:  Recent Labs  07/13/16 0447 07/13/16 1258 07/14/16 0350  WBC 11.5* 13.2* 11.6*  HGB 7.8* 8.0* 7.6*  HCT 21.8* 22.3* 21.5*  PLT 81* 85* 78*   BMET  Recent Labs  07/13/16 0000 07/13/16 0447 07/14/16 0350  NA 143 142 148*  K 3.7 3.5 2.5*  CL 117* 114* 113*  CO2 16* 16* 26  GLUCOSE 305* 337* 202*  BUN 52* 44* 25*  CREATININE 1.38* 1.25* 0.88  CALCIUM 7.4* 7.3* 7.3*   LFT  Recent Labs  07/11/16 1511  PROT 5.9*  ALBUMIN 2.7*  AST 63*  ALT 29  ALKPHOS 85  BILITOT 1.2   PT/INR No results for input(s): LABPROT, INR in the last 72 hours. Hepatitis Panel No results for input(s): HEPBSAG, HCVAB, HEPAIGM, HEPBIGM in the last 72  hours.  Studies/Results: No results found.   Scheduled Meds: . insulin aspart  0-20 Units Subcutaneous TID WC  . insulin aspart  0-5 Units Subcutaneous QHS  . insulin glargine  42 Units Subcutaneous Daily  . magnesium sulfate LVP 250-500 ml  4 g Intravenous Once  . mouth rinse  15 mL Mouth Rinse BID  . pantoprazole (PROTONIX) IV  40 mg Intravenous Q12H  . potassium chloride (KCL MULTIRUN) 30 mEq in 265 mL IVPB  30 mEq Intravenous Q3H  . potassium phosphate IVPB (mmol)  30 mmol Intravenous Once   Continuous Infusions: . norepinephrine (LEVOPHED) Adult infusion Stopped (07/12/16 1600)   PRN Meds:.   ASSESMENT:   *  Melena, first noted at admission..  Dark emesis in setting of possible viral gastroenteritis last week. Improved, less obviously melenic stool currently.    *  Blood loss anemia.  No transfusion to date.  *  Thrombocytopenia.  New this admission.  *  Shock, improved. Non-STEMI.  Pressors, now discontinued..    *  DKA.  *  AKI.  Resolved.       *  Hypokalemia. S/p 3 runs of potassium 30 mEq each.   *  Chronic Plavix and ASA for recent TIA 06/05/16, on hold for last 4 days..   *  Severe  diverticulosis, TVAs x 3 on 08/2015 colonoscopy.   PLAN   *  Can probably have bedside EGD this early afternoon.  Will confirm with MD Dr Havery Moros.  If the potassium returns normal, could possibly do case in Endo with MAC.      Azucena Freed  07/14/2016, 10:41 AM Pager: [phone]

## 2016-07-14 NOTE — Progress Notes (Signed)
CRITICAL VALUE ALERT  Critical value received:  Phosphorus <1  Date of notification:  07/14/16  Time of notification:  [id number]  Critical value read back:Yes.    Nurse who received alert:  Mickle Mallory   MD notified (1st page):  Dr. Randell Patient   Time of first page:  [phone]  Time MD responded:  [phone]

## 2016-07-14 NOTE — Transfer of Care (Signed)
Immediate Anesthesia Transfer of Care Note  Patient: Katie Lozano  Procedure(s) Performed: Procedure(s) with comments: ESOPHAGOGASTRODUODENOSCOPY (EGD) (N/A) - at bedside  Patient Location: PACU  Anesthesia Type:MAC  Level of Consciousness: lethargic and responds to stimulation  Airway & Oxygen Therapy: Patient Spontanous Breathing and Patient connected to nasal cannula oxygen  Post-op Assessment: Report given to RN and Post -op Vital signs reviewed and stable  Post vital signs: Reviewed and stable  Last Vitals:  Vitals:   07/14/16 1300 07/14/16 1340  BP: 118/66 (!) 151/55  Pulse: 86 89  Resp: 17 18  Temp:      Last Pain:  Vitals:   07/14/16 1235  TempSrc: Oral  PainSc:          Complications: No apparent anesthesia complications

## 2016-07-14 NOTE — Anesthesia Preprocedure Evaluation (Addendum)
Anesthesia Evaluation  Patient identified by MRN, date of birth, ID band Patient awake    Reviewed: Allergy & Precautions, NPO status , Patient's Chart, lab work & pertinent test results  History of Anesthesia Complications Negative for: history of anesthetic complications  Airway Mallampati: III  TM Distance: >3 FB Neck ROM: Limited    Dental  (+) Teeth Intact, Dental Advisory Given   Pulmonary former smoker,    Pulmonary exam normal        Cardiovascular hypertension, Normal cardiovascular exam  Impressions:  - Normal LV size with EF 55-60%. Basal inferior and basal   inferoseptal hypokinesis with septal-lateral dyssynchrony   consistent with LBBB. Normal RV size and systolic function. No   significant valvular abnormalities.   Neuro/Psych TIA (plavix)negative neurological ROS  negative psych ROS   GI/Hepatic GERD  Medicated and Controlled,  Endo/Other  diabetes, Poorly Controlled, Type 2, Insulin Dependent  Renal/GU Renal InsufficiencyRenal disease     Musculoskeletal   Abdominal   Peds  Hematology  (+) anemia ,   Anesthesia Other Findings   Reproductive/Obstetrics                          Anesthesia Physical Anesthesia Plan  ASA: III  Anesthesia Plan: MAC   Post-op Pain Management:    Induction:   Airway Management Planned: Natural Airway and Simple Face Mask  Additional Equipment:   Intra-op Plan:   Post-operative Plan:   Informed Consent: I have reviewed the patients History and Physical, chart, labs and discussed the procedure including the risks, benefits and alternatives for the proposed anesthesia with the patient or authorized representative who has indicated his/her understanding and acceptance.   Dental advisory given  Plan Discussed with: CRNA, Anesthesiologist and Surgeon  Anesthesia Plan Comments:        Anesthesia Quick Evaluation

## 2016-07-14 NOTE — Interval H&P Note (Signed)
History and Physical Interval Note:  07/14/2016 1:37 PM  Katie Lozano  has presented today for surgery, with the diagnosis of GI bleed  The various methods of treatment have been discussed with the patient and family. After consideration of risks, benefits and other options for treatment, the patient has consented to  Procedure(s) with comments: ESOPHAGOGASTRODUODENOSCOPY (EGD) (N/A) - at bedside as a surgical intervention .  The patient's history has been reviewed, patient examined, no change in status, stable for surgery.  I have reviewed the patient's chart and labs.  Questions were answered to the patient's satisfaction.     Renelda Loma Elizah Lydon

## 2016-07-14 NOTE — Progress Notes (Signed)
CRITICAL VALUE ALERT  Critical value received:  K 2.5  Date of notification:  07/14/16  Time of notification:  [id number]  Critical value read back:Yes.    Nurse who received alert:  Mickle Mallory RN   MD notified (1st page):  Dr. Randell Patient   Time of first page:  [phone]  Responding MD:  Dr. Randell Patient   Time MD responded:  [phone]

## 2016-07-15 ENCOUNTER — Encounter (HOSPITAL_COMMUNITY): Payer: Self-pay | Admitting: Gastroenterology

## 2016-07-15 DIAGNOSIS — E872 Acidosis, unspecified: Secondary | ICD-10-CM | POA: Diagnosis present

## 2016-07-15 DIAGNOSIS — K3189 Other diseases of stomach and duodenum: Secondary | ICD-10-CM

## 2016-07-15 DIAGNOSIS — K209 Esophagitis, unspecified: Secondary | ICD-10-CM

## 2016-07-15 DIAGNOSIS — Z452 Encounter for adjustment and management of vascular access device: Secondary | ICD-10-CM | POA: Insufficient documentation

## 2016-07-15 LAB — CBC
HCT: 20.4 % — ABNORMAL LOW (ref 36.0–46.0)
Hemoglobin: 7.1 g/dL — ABNORMAL LOW (ref 12.0–15.0)
MCH: 32.6 pg (ref 26.0–34.0)
MCHC: 34.8 g/dL (ref 30.0–36.0)
MCV: 93.6 fL (ref 78.0–100.0)
Platelets: 83 10*3/uL — ABNORMAL LOW (ref 150–400)
RBC: 2.18 MIL/uL — ABNORMAL LOW (ref 3.87–5.11)
RDW: 14.4 % (ref 11.5–15.5)
WBC: 7.6 10*3/uL (ref 4.0–10.5)

## 2016-07-15 LAB — GLUCOSE, CAPILLARY
GLUCOSE-CAPILLARY: 69 mg/dL (ref 65–99)
GLUCOSE-CAPILLARY: 179 mg/dL — AB (ref 65–99)
GLUCOSE-CAPILLARY: 164 mg/dL — AB (ref 65–99)
Glucose-Capillary: 124 mg/dL — ABNORMAL HIGH (ref 65–99)

## 2016-07-15 LAB — BASIC METABOLIC PANEL
Anion gap: 4 — ABNORMAL LOW (ref 5–15)
BUN: 16 mg/dL (ref 6–20)
CHLORIDE: 118 mmol/L — AB (ref 101–111)
CO2: 26 mmol/L (ref 22–32)
CREATININE: 0.62 mg/dL (ref 0.44–1.00)
Calcium: 6.7 mg/dL — ABNORMAL LOW (ref 8.9–10.3)
GFR calc Af Amer: >60 mL/min (ref >60)
GLUCOSE: 105 mg/dL — AB (ref 65–99)
POTASSIUM: 3 mmol/L — AB (ref 3.5–5.1)
Sodium: 148 mmol/L — ABNORMAL HIGH (ref 135–145)

## 2016-07-15 MED ORDER — INSULIN ASPART 100 UNIT/ML ~~LOC~~ SOLN
0.0000 [IU] | Freq: Three times a day (TID) | SUBCUTANEOUS | Status: DC
  Administered 2016-07-15: 2 [IU] via SUBCUTANEOUS
  Administered 2016-07-16: 5 [IU] via SUBCUTANEOUS
  Administered 2016-07-16: 2 [IU] via SUBCUTANEOUS
  Administered 2016-07-17: 3 [IU] via SUBCUTANEOUS
  Administered 2016-07-17 – 2016-07-18 (×3): 2 [IU] via SUBCUTANEOUS
  Administered 2016-07-19: 1 [IU] via SUBCUTANEOUS
  Administered 2016-07-19: 2 [IU] via SUBCUTANEOUS
  Administered 2016-07-20: 5 [IU] via SUBCUTANEOUS
  Administered 2016-07-20: 1 [IU] via SUBCUTANEOUS
  Administered 2016-07-21 – 2016-07-22 (×3): 2 [IU] via SUBCUTANEOUS
  Administered 2016-07-22: 7 [IU] via SUBCUTANEOUS

## 2016-07-15 MED ORDER — INSULIN GLARGINE 100 UNIT/ML ~~LOC~~ SOLN
40.0000 [IU] | Freq: Every day | SUBCUTANEOUS | Status: DC
  Filled 2016-07-15: qty 0.4

## 2016-07-15 MED ORDER — INSULIN GLARGINE 100 UNIT/ML ~~LOC~~ SOLN
20.0000 [IU] | Freq: Every day | SUBCUTANEOUS | Status: DC
  Administered 2016-07-16 – 2016-07-22 (×7): 20 [IU] via SUBCUTANEOUS
  Filled 2016-07-15 (×9): qty 0.2

## 2016-07-15 MED ORDER — SODIUM CHLORIDE 0.45 % IV SOLN
INTRAVENOUS | Status: DC
  Administered 2016-07-15 – 2016-07-17 (×4): via INTRAVENOUS
  Administered 2016-07-17: 1000 mL via INTRAVENOUS
  Administered 2016-07-18: via INTRAVENOUS
  Administered 2016-07-18: 1000 mL via INTRAVENOUS
  Administered 2016-07-19 – 2016-07-21 (×5): via INTRAVENOUS
  Administered 2016-07-21 – 2016-07-22 (×2): 100 mL/h via INTRAVENOUS

## 2016-07-15 MED ORDER — POTASSIUM PHOSPHATES 15 MMOLE/5ML IV SOLN
20.0000 mmol | Freq: Once | INTRAVENOUS | Status: AC
  Administered 2016-07-15: 20 mmol via INTRAVENOUS
  Filled 2016-07-15: qty 6.67

## 2016-07-15 MED ORDER — SUCRALFATE 1 GM/10ML PO SUSP
1.0000 g | Freq: Four times a day (QID) | ORAL | Status: DC
  Administered 2016-07-15 – 2016-07-22 (×27): 1 g via ORAL
  Filled 2016-07-15 (×29): qty 10

## 2016-07-15 NOTE — Progress Notes (Signed)
PULMONARY / CRITICAL CARE MEDICINE   Name: BRIAJA Lozano MRN: [id number] DOB: Jun 24, 1941    ADMISSION DATE:  07/11/2016 CONSULTATION DATE:  1/12  REFERRING MD:  Verta Ellen  CHIEF COMPLAINT:  Acute encephalopathy   BRIEF:  76 y/o female with DM2 and recent TIA who was admitted on 1/13 with acute encephalopathy from DKA after a viral gastroenteritis.  This has been associated with shock, NSTEMI and lactic acidosis.   SUBJECTIVE:  Hemoglobin 7.1 this AM. EGD yesterday revealed severe esophagitis concerning for esophageal necrosis. She denies any abdominal pain or nausea. Reports a few times experiencing mild throat pain. She is thirsty but we discussed that she cannot swallow anything at this time.    VITAL SIGNS: BP (!) 136/53 (BP Location: Left Arm)   Pulse 86   Temp 98.5 F (36.9 C) (Oral)   Resp 20   Ht 5\' 5"  (1.651 m)   Wt 154 lb 15.7 oz (70.3 kg)   SpO2 97%   BMI 25.79 kg/m   HEMODYNAMICS:    VENTILATOR SETTINGS:    INTAKE / OUTPUT: I/O last 3 completed shifts: In: 3445.1 [P.O.:120; I.V.:2135.1; IV Piggyback:1190] Out: 3095 [Urine:2555; Stool:540]  PHYSICAL EXAMINATION: General:  Chronically ill appearing elderly woman resting in bed HENT: NCAT, EOMI, PERRL, mucus membranes dry PULM: CTA bilaterally CV: RRR, no m/g/r GI: BS+, soft, non-tender, non-distended  MSK: normal bulk and tone Neuro: awake, oriented to person and place. Follows commands.  LABS:  BMET  Recent Labs Lab 07/13/16 0447 07/14/16 0350 07/14/16 1202 07/15/16 0500  NA 142 148*  --  148*  K 3.5 2.5* 3.2* 3.0*  CL 114* 113*  --  118*  CO2 16* 26  --  26  BUN 44* 25*  --  16  CREATININE 1.25* 0.88  --  0.62  GLUCOSE 337* 202*  --  105*    Electrolytes  Recent Labs Lab 07/11/16 1642  07/13/16 0447 07/14/16 0350 07/14/16 2036 07/15/16 0500  CALCIUM 8.1*  < > 7.3* 7.3*  --  6.7*  MG 2.6*  --   --  1.5* 2.4  --   PHOS 12.4*  --   --  <1.0* 1.6*  --   < > = values in this  interval not displayed.  CBC  Recent Labs Lab 07/13/16 1258 07/14/16 0350 07/15/16 0500  WBC 13.2* 11.6* 7.6  HGB 8.0* 7.6* 7.1*  HCT 22.3* 21.5* 20.4*  PLT 85* 78* 83*    Coag's No results for input(s): APTT, INR in the last 168 hours.  Sepsis Markers  Recent Labs Lab 07/11/16 1642 07/11/16 2200 07/12/16 0010 07/12/16 1243 07/13/16 0447 07/14/16 0350  LATICACIDVEN 9.9* 3.5* 3.0*  --   --   --   PROCALCITON  --   --   --  7.79 4.98 1.79    ABG  Recent Labs Lab 07/11/16 1459  PHART 6.860*  PCO2ART 34.5  PO2ART 205.0*    Liver Enzymes  Recent Labs Lab 07/11/16 1511  AST 63*  ALT 29  ALKPHOS 85  BILITOT 1.2  ALBUMIN 2.7*    Cardiac Enzymes  Recent Labs Lab 07/12/16 1243 07/12/16 1837 07/13/16 0025  TROPONINI 0.91* 0.82* 0.65*    Glucose  Recent Labs Lab 07/14/16 0722 07/14/16 1233 07/14/16 1616 07/14/16 1927 07/14/16 2158 07/14/16 2227  GLUCAP 194* 229* 148* 92 69 185*    Imaging No results found.   STUDIES:   CULTURES: BC x 2 1/12>>> 1/2 aerococcus  UC 1/12>>> No growth  ANTIBIOTICS: vanc 1/12>> 1/13 Zosyn 1/12>>>1/15  SIGNIFICANT EVENTS:   LINES/TUBES:   DISCUSSION: 79 yof w/ probable dka and sepsis vs hypovolemic shock. Favor hypovolemia. Admitting under DKA protocol. Central access placed.   ASSESSMENT / PLAN:  PULMONARY A: No acute issues P:   Monitor O2 saturation  CARDIOVASCULAR A:  Shock persists, uncertain etiology; doubt sepsis, suspect hypovolemic but appears euvolemic on exam; ddx cardiogenic vs less likely septic- resolving NSTEMI Grade 1 diastolic dysfunction per echo P:  Discontinue CVP checks Hold ASA/anticoagulation with GI bleed  RENAL A:   AKI> resolved Lactic acidosis >resolved Hyperkalemia  > resolved DKA> resolved Hyponatremia > resolved Non-anion gap acidosis on 1/13> due to saline resuscitation> resolved Hypokalemia Hypophosphatemia Hypomagnesemia-resolved  P:   Check  phos this afternoon Check bmet in AM Monitor BMET and UOP Replace electrolytes as needed Avoid saline   GASTROINTESTINAL A:   Recent Gastroenteritis +FOBT, drop in Hgb (9.4>7.8), concern for GI bleed  Hx severe diverticulosis, s/p removal 3 tubular adenomas, sm int hemorrhoids (per colo in March 2017) 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 1/15 P:   Continue PPI bid for now Strict NPO May need TPN No OG tube  F/u H pylori antibody GI following, appreciate recommendations  HEMATOLOGIC A:   Anemia  Thrombocytopenia  P:  Hold sub q heparin Transfuse for Hgb < 7.0  INFECTIOUS A:   Aerococcus bacteremia> likely contaminant Doubt Sepsis, no clear evidence of infection  Recent viral gastroenteritis P:   Follow cultures  ENDOCRINE A:   DKA, ketones in urine> resolved 1 episode hypoglycemia overnight P:   Decrease Lantus to 40 units Continue resistant SSI NPO   NEUROLOGIC A:   Acute encephalopathy- steadily improving  P:   RASS goal: 0 Holding sedating meds   FAMILY  - Updates: Sister updated bedside 1/15  - Inter-disciplinary family meet or Palliative Care meeting due by:1/19   Albin Felling, MD, MPH Internal Medicine Resident, PGY-III Pager: [phone] 07/15/2016, 6:41 AM   STAFF NOTE: Linwood Dibbles, MD FACP have personally reviewed patient's available data, including medical history, events of note, physical examination and test results as part of my evaluation. I have discussed with resident/NP and other care providers such as pharmacist, RN and RRT. In addition, I personally evaluated patient and elicited key findings of: awake, no distress, lungs clear, abdo soft, reviewed egd , no persistent lactic / ldh, likley nontransmural ischemia / esophagitis, on ppi drip, consider 3 days then to bid, on clears, plat count up, follow trend further, to triad / med floor, would follow bmet for bicarb, await h pylori  Lavon Paganini. Titus Mould, MD,  [place] Pgr: [facility] 07/15/2016 11:04 AM

## 2016-07-15 NOTE — Progress Notes (Signed)
Inpatient Diabetes Program Recommendations  AACE/ADA: New Consensus Statement on Inpatient Glycemic Control (2015)  Target Ranges:  Prepandial:   less than 140 mg/dL      Peak postprandial:   less than 180 mg/dL (1-2 hours)      Critically ill patients:  140 - 180 mg/dL   Lab Results  Component Value Date   GLUCAP 69 07/15/2016   HGBA1C 8.8 (H) 07/11/2016    Review of Glycemic Control:  Results for LEZLI, BUENROSTRO (MRN [id number]) as of 07/15/2016 14:37  Ref. Range 07/14/2016 19:27 07/14/2016 21:58 07/14/2016 22:27 07/15/2016 08:23 07/15/2016 12:13  Glucose-Capillary Latest Ref Range: 65 - 99 mg/dL 92 69 185 (H) 124 (H) 69   Inpatient Diabetes Program Recommendations:    Please consider reducing Novolog correction to sensitive tid with meals.  Also may consider reducing Lantus to 38 units daily.  Thanks, Adah Perl, RN, BC-ADM Inpatient Diabetes Coordinator Pager [phone] (8a-5p)

## 2016-07-15 NOTE — Progress Notes (Signed)
Progress Note   Subjective  Patient did well overnight. No further bleeding overnight. BUN downtrending. Hgb remains in 7s.  She denies any chest pains, no fevers. She is thirsty and wants to drink. She has been NPO for > 24 hours.    Objective   Vital signs in last 24 hours: Temp:  [97.7 F (36.5 C)-99 F (37.2 C)] 98.5 F (36.9 C) (01/16 0328) Pulse Rate:  [78-93] 88 (01/16 0800) Resp:  [12-29] 21 (01/16 0800) BP: (102-157)/(38-78) 135/56 (01/16 0800) SpO2:  [92 %-100 %] 95 % (01/16 0800) Last BM Date: 07/15/16 General:    white female in NAD, sitting upright in bed Heart:  Regular rate and rhythm; no murmurs Lungs: Respirations even and unlabored, lungs CTA bilaterally Abdomen:  Soft, nontender and nondistended.  Extremities:  Trace edema B. Neurologic:  Alert and oriented,  grossly normal neurologically. Psych:  Cooperative. Normal mood and affect.  Intake/Output from previous day: 01/15 0701 - 01/16 0700 In: 1221 [I.V.:620; IV Piggyback:601] Out: 1015 [Urine:980; Stool:35] Intake/Output this shift: Total I/O In: 50 [I.V.:50] Out: -   Lab Results:  Recent Labs  07/13/16 1258 07/14/16 0350 07/15/16 0500  WBC 13.2* 11.6* 7.6  HGB 8.0* 7.6* 7.1*  HCT 22.3* 21.5* 20.4*  PLT 85* 78* 83*   BMET  Recent Labs  07/13/16 0447 07/14/16 0350 07/14/16 1202 07/15/16 0500  NA 142 148*  --  148*  K 3.5 2.5* 3.2* 3.0*  CL 114* 113*  --  118*  CO2 16* 26  --  26  GLUCOSE 337* 202*  --  105*  BUN 44* 25*  --  16  CREATININE 1.25* 0.88  --  0.62  CALCIUM 7.3* 7.3*  --  6.7*   LFT No results for input(s): PROT, ALBUMIN, AST, ALT, ALKPHOS, BILITOT, BILIDIR, IBILI in the last 72 hours. PT/INR No results for input(s): LABPROT, INR in the last 72 hours.  Studies/Results: No results found.     Assessment / Plan:   76 y/o female who presented with DKA, shock, AKI, NSTEMI, who developed an upper GI bleed with melena. 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 She also had erosive gastritis.   I suspect her AEN was the result of DKA / shock, due to ischemic injury. Depending on the underlying etiology, risk of mortality associated with this is upwards of 30%, with increased risk of esophageal perforation. Fortunately the etiology for her case is reversible and she's doing much better clinically, and hopefully this heals up well. Long term, there is high rate of esophageal stricture formation which she will need to be evaluated for as outpatient. Given the rarity of AEN, there are no consensus management guidelines, although most sources recommend NPO for at least 24 hours, avoidance of NG tube if possible, and PPI. She's been NPO for > 24 hours, WBC resolved, and clinically improved.   At this time recommend the following: - trial of clear liquids today, would not advance beyond this - start liquid carafate 10cc po q 6 hours. Please note this will cause the patient to have black / dark stools - continue IV protonix - trend Hgb, transfuse PRN, if any symptoms of bleeding please contact me - if patient develops chest pain, odynophagia, chest pain, etc, please notify us - please keep head of bed elevated at all times, and sit upright with PO intake - H pylori IgG,  will treat if positive  We will continue to follow, please page with questions/ concerns.  [place] Cellar, MD [facility] Pager [phone]

## 2016-07-16 ENCOUNTER — Encounter (HOSPITAL_COMMUNITY): Payer: Self-pay | Admitting: General Practice

## 2016-07-16 LAB — CBC
HEMATOCRIT: 22.7 % — AB (ref 36.0–46.0)
HEMOGLOBIN: 7.8 g/dL — AB (ref 12.0–15.0)
MCH: 32.5 pg (ref 26.0–34.0)
MCHC: 34.4 g/dL (ref 30.0–36.0)
MCV: 94.6 fL (ref 78.0–100.0)
Platelets: 125 10*3/uL — ABNORMAL LOW (ref 150–400)
RBC: 2.4 MIL/uL — ABNORMAL LOW (ref 3.87–5.11)
RDW: 14.3 % (ref 11.5–15.5)
WBC: 8.7 10*3/uL (ref 4.0–10.5)

## 2016-07-16 LAB — CULTURE, BLOOD (ROUTINE X 2)
CULTURE: NO GROWTH
CULTURE: NO GROWTH

## 2016-07-16 LAB — BASIC METABOLIC PANEL
Anion gap: 7 (ref 5–15)
BUN: 14 mg/dL (ref 6–20)
CHLORIDE: 113 mmol/L — AB (ref 101–111)
CO2: 22 mmol/L (ref 22–32)
CREATININE: 0.68 mg/dL (ref 0.44–1.00)
Calcium: 7 mg/dL — ABNORMAL LOW (ref 8.9–10.3)
GFR calc Af Amer: >60 mL/min (ref >60)
GFR calc non Af Amer: >60 mL/min (ref >60)
GLUCOSE: 128 mg/dL — AB (ref 65–99)
Potassium: 3 mmol/L — ABNORMAL LOW (ref 3.5–5.1)
SODIUM: 142 mmol/L (ref 135–145)

## 2016-07-16 LAB — GLUCOSE, CAPILLARY
GLUCOSE-CAPILLARY: 99 mg/dL (ref 65–99)
Glucose-Capillary: 114 mg/dL — ABNORMAL HIGH (ref 65–99)
Glucose-Capillary: 267 mg/dL — ABNORMAL HIGH (ref 65–99)
Glucose-Capillary: 154 mg/dL — ABNORMAL HIGH (ref 65–99)

## 2016-07-16 LAB — H. PYLORI ANTIBODY, IGG: H Pylori IgG: <0.8 Index Value (ref 0.00–0.79)

## 2016-07-16 NOTE — Progress Notes (Signed)
Patient with loose stool and rectal pouch leaking. Replaced with new rectal pouch.

## 2016-07-16 NOTE — Progress Notes (Signed)
      Progress Note   Subjective  Patient tolerated clears yesterday. She denies chest pains. No bowel movements or symptoms of bleeding. Hgb stable. She feels considerably better.   Objective   Vital signs in last 24 hours: Temp:  [97.7 F (36.5 C)-98.7 F (37.1 C)] 98.7 F (37.1 C) (01/17 0629) Pulse Rate:  [53-94] 87 (01/17 0629) Resp:  [18-26] 19 (01/17 0629) BP: (107-136)/(37-91) 121/48 (01/17 0629) SpO2:  [92 %-100 %] 92 % (01/17 0629) Weight:  [159 lb 8 oz (72.3 kg)] 159 lb 8 oz (72.3 kg) (01/16 1759) Last BM Date: 07/15/16 General:    white female in NAD Heart:  Regular rate and rhythm;  Lungs: Respirations even and unlabored, lungs CTA bilaterally Abdomen:  Soft, nontender and nondistended.  Extremities:  Trace edema. Neurologic:  Alert and oriented,  grossly normal neurologically. Psych:  Cooperative. Normal mood and affect.  Intake/Output from previous day: 01/16 0701 - 01/17 0700 In: 1335.4 [I.V.:1335.4] Out: -  Intake/Output this shift: No intake/output data recorded.  Lab Results:  Recent Labs  07/14/16 0350 07/15/16 0500 07/16/16 0632  WBC 11.6* 7.6 8.7  HGB 7.6* 7.1* 7.8*  HCT 21.5* 20.4* 22.7*  PLT 78* 83* 125*   BMET  Recent Labs  07/14/16 0350 07/14/16 1202 07/15/16 0500 07/16/16 0632  NA 148*  --  148* 142  K 2.5* 3.2* 3.0* 3.0*  CL 113*  --  118* 113*  CO2 26  --  26 22  GLUCOSE 202*  --  105* 128*  BUN 25*  --  16 14  CREATININE 0.88  --  0.62 0.68  CALCIUM 7.3*  --  6.7* 7.0*   LFT No results for input(s): PROT, ALBUMIN, AST, ALT, ALKPHOS, BILITOT, BILIDIR, IBILI in the last 72 hours. PT/INR No results for input(s): LABPROT, INR in the last 72 hours.  Studies/Results: No results found.     Assessment / Plan:   76 y/o female who presented with DKA, shock, AKI, NSTEMI, who developed an upper GI bleed with melena. EGD showed  "black esophagus", concerning for acute esophageal necrosis (AEN) of the mid and distal  esophagus. She also had erosive gastritis.   See full discussion of this in yesterday's note. Overall improved, tolerating clear liquids, no further bleeding, Hgb stable, BUN downtrended.  At this time recommend the following: - full liquid diet today, if she does well can advance to solids tomorrow - continue liquid carafate 10cc po q 6 hours - counseled patient this will make her stools dark - continue IV protonix for now - keep head of bed elevated at all times, sit upright with PO intake - await H pylori serology, treat if positive - patient will need repeat EGD as outpatient following discharge to check for interval healing and assess for stricture formation  Will continue to follow, please call with questions.  [place] Cellar, MD [facility] Pager [phone]

## 2016-07-16 NOTE — Progress Notes (Signed)
Patient Demographics:    Katie Lozano, is a 76 y.o. female, DOB - 11/05/1940, [id number]  Admit date - 07/11/2016   Admitting Physician Juanito Doom, MD  Outpatient Primary MD for the patient is Hoyt Koch, MD  LOS - 5   Chief Complaint  Patient presents with  . Altered Mental Status        Subjective:    Katie Lozano today has no fevers, no emesis,  No chest pain,  Tolerating full liquid diet well, patient had hypoxic episode with ambulation, recovered with 2-3 L of nasal cannula oxygen, diarrhea noted   Assessment  & Plan :    Active Problems:   Diabetic ketoacidosis without coma associated with type 2 diabetes mellitus ([place])   AKI (acute kidney injury) ([place])   UGIB (upper gastrointestinal bleed)   Melena   Acute blood loss anemia   Encounter for central line placement   Metabolic acidosis  76 y/o female who presented with DKA, shock, AKI, NSTEMI, who developed an upper GI bleed with melena. 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 She also had erosive gastritis.   I suspect her AEN was the result of DKA / shock, due to ischemic injury. Depending on the underlying etiology, risk of mortality associated with this is upwards of 30%, with increased risk of esophageal perforation. Fortunately the etiology for her case is reversible and she's doing much better clinically, and hopefully this heals up well. Long term, there is high rate of esophageal stricture formation which she will need to be evaluated for as outpatient. Given the rarity of AEN, there are no consensus management guidelines, although most sources recommend NPO for at least 24 hours, avoidance of NG tube if possible, and PPI. She's been NPO for > 24 hours, WBC resolved, and clinically improved.   At this time recommend  the following: - trial of clear liquids today, would not advance beyond this - start liquid carafate 10cc po q 6 hours. Please note this will cause the patient to have black / dark stools - continue IV protonix - trend Hgb, transfuse PRN, if any symptoms of bleeding please contact me - if patient develops chest pain, odynophagia, chest pain, etc, please notify us - please keep head of bed elevated at all times, and sit upright with PO intake - H pylori IgG, will treat if positive  Repeat EGD as outpatient to assess for healing and stricture formation  BRIEF:  76 y/o female with DM2 and recent TIA who was admitted on 1/13 with acute encephalopathy from DKA after a viral gastroenteritis.  This has been associated with shock, NSTEMI and lactic acidosis.    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/14/16- continue IV PPI 40 mg twice a day, full liquid diet, pt h/o  severe diverticulosis, s/p removal 3 tubular adenomas, sm int hemorrhoids (per colo in March 2017)  5)Anemia/thrombocytopenia- +FOBT, drop in Hgb (9.4>7.8)- transfuse as clinically indicated  6)DM- resolved DKA, continue Lantus and Use Novolog/Humalog Sliding scale insulin with Accu-Cheks/Fingersticks as ordered  7) generalized weakness/debility- continue physical therapy   Code Status : full   Disposition Plan  : To be determined  Consults  :  Gi   DVT Prophylaxis  :  SCDs   Lab Results  Component Value Date   PLT 125 (L) 07/16/2016    Inpatient Medications  Scheduled Meds: . insulin aspart  0-9 Units Subcutaneous TID WC  . insulin glargine  20 Units Subcutaneous Daily  . mouth rinse  15 mL Mouth Rinse BID  . potassium chloride (KCL  MULTIRUN) 30 mEq in 265 mL IVPB  30 mEq Intravenous Once  . sucralfate  1 g Oral Q6H   Continuous Infusions: . sodium chloride 100 mL/hr at 07/15/16 2151  . pantoprozole (PROTONIX) infusion 8 mg/hr (07/15/16 2151)   PRN Meds:.    Anti-infectives    Start     Dose/Rate Route Frequency Ordered Stop   07/13/16 1600  vancomycin (VANCOCIN) IVPB 1000 mg/200 mL premix  Status:  Discontinued     1,000 mg 200 mL/hr over 60 Minutes Intravenous Every 48 hours 07/11/16 2224 07/12/16 1331   07/13/16 1300  piperacillin-tazobactam (ZOSYN) IVPB 3.375 g  Status:  Discontinued     3.375 g 12.5 mL/hr over 240 Minutes Intravenous Every 8 hours 07/13/16 0955 07/14/16 1108   07/12/16 1700  vancomycin (VANCOCIN) IVPB 750 mg/150 ml premix  Status:  Discontinued     750 mg 150 mL/hr over 60 Minutes Intravenous Every 24 hours 07/11/16 1621 07/11/16 2224   07/11/16 2300  piperacillin-tazobactam (ZOSYN) IVPB 3.375 g  Status:  Discontinued     3.375 g 12.5 mL/hr over 240 Minutes Intravenous Every 8 hours 07/11/16 1621 07/11/16 2224   07/11/16 2230  piperacillin-tazobactam (ZOSYN) IVPB 2.25 g  Status:  Discontinued     2.25 g 100 mL/hr over 30 Minutes Intravenous Every 8 hours 07/11/16 2224 07/13/16 0955   07/11/16 1530  vancomycin (VANCOCIN) 1,250 mg in sodium chloride 0.9 % 250 mL IVPB     1,250 mg 166.7 mL/hr over 90 Minutes Intravenous  Once 07/11/16 1501 07/12/16 0700   07/11/16 1500  piperacillin-tazobactam (ZOSYN) IVPB 3.375 g     3.375 g 100 mL/hr over 30 Minutes Intravenous  Once 07/11/16 1451 07/11/16 1706   07/11/16 1500  vancomycin (VANCOCIN) IVPB 1000 mg/200 mL premix  Status:  Discontinued     1,000 mg 200 mL/hr over 60 Minutes Intravenous  Once 07/11/16 1451 07/11/16 1501        Objective:   Vitals:   07/15/16 1759 07/15/16 2103 07/15/16 2114 07/16/16 0629  BP: 107/62 (!) 112/37 (!) 127/58 (!) 121/48  Pulse: 87 84 (!) 53 87  Resp: 18 18 18 19   Temp: 97.7 F (36.5 C) 98 F (36.7 C)  97.9 F (36.6 C) 98.7 F (37.1 C)  TempSrc:  Oral Oral Oral  SpO2: 100% 98% 97% 92%  Weight: 72.3 kg (159 lb 8 oz)     Height: 5\' 4"  (1.626 m)       Wt Readings from Last 3 Encounters:  07/15/16 72.3 kg (159 lb 8 oz)  06/06/16 68.7 kg (151 lb 7.3 oz)  09/04/15 67.6 kg (149 lb)     Intake/Output Summary (Last 24 hours) at 07/16/16 0940 Last data filed at 07/16/16 0305  Gross per 24 hour  Intake          1260.41 ml  Output  0 ml  Net          1260.41 ml     Physical Exam  Gen:- Awake Alert,  Inn no apparent distress  HEENT:- [place].AT, No sclera icterus Neck-Supple Neck,No JVD,.  Lungs-  CTAB  CV- S1, S2 normal Abd-  +ve B.Sounds, Abd Soft, No tenderness,    Extremity/Skin:- No  edema,    Rectal- Rectal tube     Data Review:   Micro Results Recent Results (from the past 240 hour(s))  Blood Culture (routine x 2)     Status: None (Preliminary result)   Collection Time: 07/11/16  4:50 PM  Result Value Ref Range Status   Specimen Description BLOOD LEFT EJ  Final   Special Requests BOTTLES DRAWN AEROBIC AND ANAEROBIC 5CC  Final   Culture NO GROWTH 4 DAYS  Final   Report Status PENDING  Incomplete  Blood Culture (routine x 2)     Status: Abnormal (Preliminary result)   Collection Time: 07/11/16  5:09 PM  Result Value Ref Range Status   Specimen Description BLOOD RIGHT HAND  Final   Special Requests AEROCOCCUS SPECIES 6CC (A)  Final   Culture NO GROWTH 4 DAYS  Final   Report Status PENDING  Incomplete  MRSA PCR Screening     Status: None   Collection Time: 07/11/16  6:56 PM  Result Value Ref Range Status   MRSA by PCR NEGATIVE NEGATIVE Final    Comment:        The GeneXpert MRSA Assay (FDA approved for NASAL specimens only), is one component of a comprehensive MRSA colonization surveillance program. It is not intended to diagnose MRSA infection nor to guide or monitor treatment for MRSA infections.   Urine culture     Status: None   Collection  Time: 07/11/16  8:23 PM  Result Value Ref Range Status   Specimen Description URINE, CATHETERIZED  Final   Special Requests NONE  Final   Culture NO GROWTH  Final   Report Status 07/13/2016 FINAL  Final    Radiology Reports Portable Chest X-ray (1 View)  Result Date: 07/11/2016 CLINICAL DATA:  Central line placement EXAM: PORTABLE CHEST 1 VIEW COMPARISON:  07/11/2016 FINDINGS: Multiple support leads obscure the chest. Insertion of left-sided central venous catheter, the tip is faintly visualized over the proximal right atrium. No pneumothorax. Low lung volumes without acute infiltrate. Mild subsegmental atelectasis at the left CP angle. Stable cardiomediastinal silhouette. Surgical clips in the right upper quadrant. IMPRESSION: Left-sided central venous catheter tip overlies the proximal right atrium. No pneumothorax. Electronically Signed   By: Donavan Foil M.D.   On: 07/11/2016 16:45   Dg Chest Port 1 View  Result Date: 07/11/2016 CLINICAL DATA:  Unresponsive, nausea and vomiting EXAM: PORTABLE CHEST 1 VIEW COMPARISON:  06/05/2016 FINDINGS: Borderline cardiomegaly. Elevation of the right hemidiaphragm again noted. No infiltrate or pulmonary edema. Central mild vascular congestion. IMPRESSION: Cardiomegaly. Central mild vascular congestion without convincing pulmonary edema. No segmental infiltrate. Electronically Signed   By: [place] Crocker M.D.   On: 07/11/2016 15:34     CBC  Recent Labs Lab 07/11/16 1511  07/13/16 0447 07/13/16 1258 07/14/16 0350 07/15/16 0500 07/16/16 0632  WBC 23.4*  < > 11.5* 13.2* 11.6* 7.6 8.7  HGB 11.9*  < > 7.8* 8.0* 7.6* 7.1* 7.8*  HCT 40.3  < > 21.8* 22.3* 21.5* 20.4* 22.7*  PLT 268  < > 81* 85* 78* 83* 125*  MCV 110.1*  < > 91.6 91.8 91.9  93.6 94.6  MCH 32.5  < > 32.8 32.9 32.5 32.6 32.5  MCHC 29.5*  < > 35.8 35.9 35.3 34.8 34.4  RDW 13.8  < > 13.5 13.8 13.7 14.4 14.3  LYMPHSABS 4.0  --   --   --   --   --   --   MONOABS 3.0*  --   --   --   --   --    --   EOSABS 0.0  --   --   --   --   --   --   BASOSABS 0.2*  --   --   --   --   --   --   < > = values in this interval not displayed.  Chemistries   Recent Labs Lab 07/11/16 1511  07/11/16 1642  07/13/16 0000 07/13/16 0447 07/14/16 0350 07/14/16 1202 07/14/16 2036 07/15/16 0500 07/16/16 0632  NA 127*  < > 130*  < > 143 142 148*  --   --  148* 142  K 6.9*  < > 6.8*  < > 3.7 3.5 2.5* 3.2*  --  3.0* 3.0*  CL 85*  < > 95*  < > 117* 114* 113*  --   --  118* 113*  CO2 <7*  --  8*  < > 16* 16* 26  --   --  26 22  GLUCOSE 1,060*  < > 1,038*  < > 305* 337* 202*  --   --  105* 128*  BUN 79*  < > 83*  < > 52* 44* 25*  --   --  16 14  CREATININE 3.34*  < > 3.39*  < > 1.38* 1.25* 0.88  --   --  0.62 0.68  CALCIUM 8.8*  --  8.1*  < > 7.4* 7.3* 7.3*  --   --  6.7* 7.0*  MG  --   --  2.6*  --   --   --  1.5*  --  2.4  --   --   AST 63*  --   --   --   --   --   --   --   --   --   --   ALT 29  --   --   --   --   --   --   --   --   --   --   ALKPHOS 85  --   --   --   --   --   --   --   --   --   --   BILITOT 1.2  --   --   --   --   --   --   --   --   --   --   < > = values in this interval not displayed. ------------------------------------------------------------------------------------------------------------------ No results for input(s): CHOL, HDL, LDLCALC, TRIG, CHOLHDL, LDLDIRECT in the last 72 hours.  Lab Results  Component Value Date   HGBA1C 8.8 (H) 07/11/2016   ------------------------------------------------------------------------------------------------------------------ No results for input(s): TSH, T4TOTAL, T3FREE, THYROIDAB in the last 72 hours.  Invalid input(s): FREET3 ------------------------------------------------------------------------------------------------------------------ No results for input(s): VITAMINB12, FOLATE, FERRITIN, TIBC, IRON, RETICCTPCT in the last 72 hours.  Coagulation profile No results for input(s): INR, PROTIME in the last 168  hours.  No results for input(s): DDIMER in the last 72 hours.  Cardiac Enzymes  Recent Labs Lab 07/12/16 1243 07/12/16 1837 07/13/16 0025  TROPONINI  0.91* 0.82* 0.65*   ------------------------------------------------------------------------------------------------------------------ No results found for: BNP   Andres Bantz M.D on 07/16/2016 at 9:40 AM  Between 7am to 7pm - Pager - [phone]  After 7pm go to [email or web address] - password [email or web address]  [facility] -  Office  [phone]  Dragon dictation system was used to create this note, attempts have been made to correct errors, however presence of uncorrected errors is not a reflection quality of care provided

## 2016-07-17 DIAGNOSIS — L8990 Pressure ulcer of unspecified site, unspecified stage: Secondary | ICD-10-CM | POA: Diagnosis present

## 2016-07-17 LAB — BASIC METABOLIC PANEL
Anion gap: 9 (ref 5–15)
BUN: 11 mg/dL (ref 6–20)
CALCIUM: 7.3 mg/dL — AB (ref 8.9–10.3)
CO2: 20 mmol/L — AB (ref 22–32)
CREATININE: 0.72 mg/dL (ref 0.44–1.00)
Chloride: 112 mmol/L — ABNORMAL HIGH (ref 101–111)
GFR calc non Af Amer: >60 mL/min (ref >60)
Glucose, Bld: 103 mg/dL — ABNORMAL HIGH (ref 65–99)
Potassium: 2.9 mmol/L — ABNORMAL LOW (ref 3.5–5.1)
Sodium: 141 mmol/L (ref 135–145)

## 2016-07-17 LAB — GLUCOSE, CAPILLARY
GLUCOSE-CAPILLARY: 81 mg/dL (ref 65–99)
GLUCOSE-CAPILLARY: 181 mg/dL — AB (ref 65–99)
Glucose-Capillary: 217 mg/dL — ABNORMAL HIGH (ref 65–99)
Glucose-Capillary: 153 mg/dL — ABNORMAL HIGH (ref 65–99)
Glucose-Capillary: 158 mg/dL — ABNORMAL HIGH (ref 65–99)

## 2016-07-17 MED ORDER — DIPHENOXYLATE-ATROPINE 2.5-0.025 MG PO TABS
1.0000 | ORAL_TABLET | Freq: Once | ORAL | Status: AC
  Administered 2016-07-17: 1 via ORAL
  Filled 2016-07-17: qty 1

## 2016-07-17 MED ORDER — POTASSIUM CHLORIDE CRYS ER 20 MEQ PO TBCR
40.0000 meq | EXTENDED_RELEASE_TABLET | Freq: Once | ORAL | Status: AC
  Administered 2016-07-17: 40 meq via ORAL
  Filled 2016-07-17: qty 2

## 2016-07-17 MED ORDER — DIPHENOXYLATE-ATROPINE 2.5-0.025 MG/5ML PO LIQD: 10.0000 mL | Freq: Once | ORAL | Status: DC

## 2016-07-17 MED ORDER — PANTOPRAZOLE SODIUM 40 MG IV SOLR
40.0000 mg | Freq: Two times a day (BID) | INTRAVENOUS | Status: DC
  Administered 2016-07-17 – 2016-07-22 (×11): 40 mg via INTRAVENOUS
  Filled 2016-07-17 (×12): qty 40

## 2016-07-17 NOTE — NC FL2 (Signed)
[place] MEDICAID FL2 LEVEL OF CARE SCREENING TOOL     IDENTIFICATION  Patient Name: Katie Lozano Birthdate: 10/18/1940 Sex: female Admission Date (Current Location): 07/11/2016  [facility] and [place] Number:  Herbalist and Address:  [facility] [place] [place] [address] [place], [place] [address]      Provider Number: [id number]  Attending Physician Name and Address:  Roxan Hockey, MD  Relative Name and Phone Number:       Current Level of Care: Hospital Recommended Level of Care: [place] Prior Approval Number:    Date Approved/Denied:   PASRR Number: [id number]  Discharge Plan: SNF    Current Diagnoses: Patient Active Problem List   Diagnosis Date Noted  . Encounter for central line placement   . Metabolic acidosis   . AKI (acute kidney injury) ([place])   . UGIB (upper gastrointestinal bleed)   . Melena   . Acute blood loss anemia   . Diabetic ketoacidosis without coma associated with type 2 diabetes mellitus ([place]) 07/11/2016  . Hypovolemic shock ([place])   . Essential hypertension 06/06/2016  . Hypokalemia 06/06/2016  . CKD (chronic kidney disease), stage III 06/06/2016  . Carotid artery syndrome 06/05/2016  . Type 2 diabetes mellitus with diabetic nephropathy, with long-term current use of insulin ([place]) 03/30/2015  . History of colonic polyps 03/30/2015  . Hyperlipidemia 03/30/2015    Orientation RESPIRATION BLADDER Height & Weight     Self, Time, Situation, Place  O2 (Nasal cannula 2L) Continent Weight: 72.3 kg (159 lb 8 oz) Height:  5\' 4"  (162.6 cm)  BEHAVIORAL SYMPTOMS/MOOD NEUROLOGICAL BOWEL NUTRITION STATUS      Incontinent (Rectal pouch) Diet (Please see DC Summary)  AMBULATORY STATUS COMMUNICATION OF NEEDS Skin   Extensive Assist Verbally PU Stage and Appropriate Care (Pressure injury stage I on sacrum)                       Personal Care Assistance Level of Assistance  Bathing, Feeding, Dressing  Bathing Assistance: Maximum assistance Feeding assistance: Independent Dressing Assistance: Limited assistance     Functional Limitations Info             SPECIAL CARE FACTORS FREQUENCY  PT (By licensed PT)     PT Frequency: 5x/week              Contractures      Additional Factors Info  Code Status, Allergies, Insulin Sliding Scale Code Status Info: Full Allergies Info: NKA   Insulin Sliding Scale Info: 3x daily with meals       Current Medications (07/17/2016):  This is the current hospital active medication list Current Facility-Administered Medications  Medication Dose Route Frequency Provider Last Rate Last Dose  . 0.45 % sodium chloride infusion   Intravenous Continuous Velna Ochs, MD 100 mL/hr at 07/17/16 1235    . insulin aspart (novoLOG) injection 0-9 Units  0-9 Units Subcutaneous TID WC Juliet Rude, MD   3 Units at 07/17/16 1233  . insulin glargine (LANTUS) injection 20 Units  20 Units Subcutaneous Daily Juliet Rude, MD   20 Units at 07/17/16 1120  . MEDLINE mouth rinse  15 mL Mouth Rinse BID Juanito Doom, MD   15 mL at 07/17/16 1026  . pantoprazole (PROTONIX) injection 40 mg  40 mg Intravenous Q12H Willia Craze, NP   40 mg at 07/17/16 1120  . potassium chloride SA (K-DUR,KLOR-CON) CR tablet 40 mEq  40 mEq Oral Once Roxan Hockey, MD      . sucralfate (CARAFATE) 1 GM/10ML suspension 1 g  1 g Oral Q6H Manus Gunning, MD   1 g at 07/17/16 1416     Discharge Medications: Please see discharge summary for a list of discharge medications.  Relevant Imaging Results:  Relevant Lab Results:   Additional Information SSN: [place]  [place], [place]

## 2016-07-17 NOTE — Progress Notes (Signed)
     [facility] Progress Note  Chief Complaint:   GI bleed  Subjective: She feels a little confused but no specific complaints. Just starting her clear liquid breakfast.   Objective:  Vital signs in last 24 hours: Temp:  [97.8 F (36.6 C)-100 F (37.8 C)] 100 F (37.8 C) (01/18 0529) Pulse Rate:  [58-89] 89 (01/18 0529) Resp:  [18-21] 18 (01/18 0529) BP: (116-129)/(35-56) 116/56 (01/18 0529) SpO2:  [97 %-99 %] 98 % (01/18 0529) Last BM Date: 07/17/16 General:   Alert, well-developed, white female  in NAD EENT:  Normal hearing, non icteric sclera, conjunctive pink.  Heart:  Regular rate and rhythm, no lower extremity edema. Some of her fingertips have cyanotic looking spots. Adequate cap refill and radial pulses Pulm: Normal respiratory effort Abdomen:  Soft, nondistended, nontender.  Normal bowel sounds, no masses felt. Neurologic:  Alert and  oriented x4;  grossly normal neurologically. Psych:  Alert and cooperative. Normal mood and affect.   Intake/Output from previous day: 01/17 0701 - 01/18 0700 In: 1912.9 [P.O.:440; I.V.:1472.9] Out: 50 [Stool:50] Intake/Output this shift: No intake/output data recorded.  Lab Results:  Recent Labs  07/15/16 0500 07/16/16 0632  WBC 7.6 8.7  HGB 7.1* 7.8*  HCT 20.4* 22.7*  PLT 83* 125*   BMET  Recent Labs  07/14/16 1202 07/15/16 0500 07/16/16 0632  NA  --  148* 142  K 3.2* 3.0* 3.0*  CL  --  118* 113*  CO2  --  26 22  GLUCOSE  --  105* 128*  BUN  --  16 14  CREATININE  --  0.62 0.68  CALCIUM  --  6.7* 7.0*     Assessment / Plan:  1. 76 yo female upper GI bleed in setting of DKA, AKI, and NSTEMI / shock. EGD on 1/15 revealed non-bleeding erosive gastritis and severe esophagitis with possible acute esophageal necrosis -on day 3 of PPI gtt. Will change to BID. -continue carafate -continue clears  2. ABL. Hgb low but stable at 7.8, down from baseline of around 12. No blood transfusions  required  3. Hypokalemia. Repeat bmet is in progress.   Active Problems:   Diabetic ketoacidosis without coma associated with type 2 diabetes mellitus ([place])   AKI (acute kidney injury) ([place])   UGIB (upper gastrointestinal bleed)   Melena   Acute blood loss anemia   Encounter for central line placement   Metabolic acidosis    LOS: 6 days   Tye Savoy NP 07/17/2016, 9:15 AM  Pager number [phone]

## 2016-07-17 NOTE — Evaluation (Signed)
Physical Therapy Evaluation Patient Details Name: Katie Lozano MRN: [id number] DOB: [birth year] Today's Date: 07/17/2016   History of Present Illness  76 yo female upper GI bleed in setting of DKA, AKI, and NSTEMI / shock. EGD on 1/15 revealed non-bleeding erosive gastritis and severe esophagitis with possible acute esophageal necrosis.  Clinical Impression  Pt admitted with above diagnosis. Pt currently with functional limitations due to the deficits listed below (see PT Problem List). Pt will benefit from skilled PT to increase their independence and safety with mobility to allow discharge to the venue listed below.  Pt with generalized weakness and o2 at 85% on room air upon entry.  Nursing informed and o2 donned at 3 L/min with sat 91-93%.  Pt appeared very fatigued from session and recommend SNF unless pt is able to improve overall mobility by time for d/c.     Follow Up Recommendations SNF    Equipment Recommendations  Other (comment) (to be determined)    Recommendations for Other Services       Precautions / Restrictions Precautions Precautions: Fall Precaution Comments: rectal pouch; frequently leaking Restrictions Weight Bearing Restrictions: No      Mobility  Bed Mobility               General bed mobility comments: Pt OOB in chair upon arrival.  Transfers Overall transfer level: Needs assistance Equipment used: 1 person hand held assist Transfers: Sit to/from Stand Sit to Stand: Min assist;+2 safety/equipment         General transfer comment: Min steadying assist.  Ambulation/Gait Ambulation/Gait assistance: +2 safety/equipment;Min assist Ambulation Distance (Feet): 28 Feet Assistive device:  (IV pole) Gait Pattern/deviations: Decreased step length - left;Decreased step length - right Gait velocity: decreased   General Gait Details: standing rest break in middle of gait.  Pt appeared very fatigued with gait.  Slightly guarded, but this could have  been due to rectal pouch and its leaking  Stairs            Wheelchair Mobility    Modified Rankin (Stroke Patients Only)       Balance Overall balance assessment: Needs assistance Sitting-balance support: Feet supported;No upper extremity supported Sitting balance-Leahy Scale: Fair     Standing balance support: Single extremity supported;During functional activity Standing balance-Leahy Scale: Fair                               Pertinent Vitals/Pain Pain Assessment: No/denies pain    Home Living Family/patient expects to be discharged to:: Private residence Living Arrangements: Alone Available Help at Discharge: Family;Friend(s);Available PRN/intermittently Type of Home: Apartment Home Access: Level entry     Home Layout: One level Home Equipment: Grab bars - tub/shower;Hand held shower head      Prior Function Level of Independence: Independent         Comments: Recently has stopped driving. Does do cooking and cleaning. Concerned about how she will get to the grocery store; sister and friend may be able to help     Hand Dominance   Dominant Hand: Right    Extremity/Trunk Assessment   Upper Extremity Assessment Upper Extremity Assessment: Defer to OT evaluation    Lower Extremity Assessment Lower Extremity Assessment: Generalized weakness    Cervical / Trunk Assessment Cervical / Trunk Assessment: Normal  Communication   Communication: No difficulties  Cognition Arousal/Alertness: Awake/alert Behavior During Therapy: Flat affect Overall Cognitive Status: Within Functional Limits for tasks  assessed                      General Comments      Exercises     Assessment/Plan    PT Assessment Patient needs continued PT services  PT Problem List Decreased strength;Decreased activity tolerance;Decreased balance;Decreased mobility          PT Treatment Interventions DME instruction;Gait training;Functional mobility  training;Balance training;Therapeutic exercise;Therapeutic activities    PT Goals (Current goals can be found in the Care Plan section)  Acute Rehab PT Goals Patient Stated Goal: take a nap PT Goal Formulation: With patient Time For Goal Achievement: 07/31/16 Potential to Achieve Goals: Good    Frequency Min 3X/week   Barriers to discharge Decreased caregiver support      Co-evaluation PT/OT/SLP Co-Evaluation/Treatment: Yes Reason for Co-Treatment: For patient/therapist safety PT goals addressed during session: Mobility/safety with mobility;Balance OT goals addressed during session: ADL's and self-care       End of Session Equipment Utilized During Treatment: Gait belt Activity Tolerance: Patient limited by fatigue Patient left: in chair;with call bell/phone within reach;with chair alarm set Nurse Communication: Mobility status;Other (comment) (o2 sats)         Time: [id number] PT Time Calculation (min) (ACUTE ONLY): 31 min   Charges:   PT Evaluation $PT Eval Moderate Complexity: 1 Procedure     PT G Codes:        Katie Lozano 07/17/2016, 1:15 PM

## 2016-07-17 NOTE — Evaluation (Signed)
Occupational Therapy Evaluation Patient Details Name: Katie Lozano MRN: [id number] DOB: [birth year] Today's Date: 07/17/2016    History of Present Illness 76 yo female upper GI bleed in setting of DKA, AKI, and NSTEMI / shock. EGD on 1/15 revealed non-bleeding erosive gastritis and severe esophagitis with possible acute esophageal necrosis.   Clinical Impression   Pt reports she was independent with ADL PTA. Currently pt requires min assist +2 for safety/lines/tubes for functional mobility and min-mod assist for ADL. SpO2 mid-low 80s on RA prior to activity (RN notified and present to apply supplemental O2), SpO2 up to low-mid 90s on 3L throughout session. Recommending SNF for follow up to maximize independence and safety with ADL and functional mobility prior to return home. Pt would benefit from continued skilled OT to address established goals.    Follow Up Recommendations  SNF;Supervision/Assistance - 24 hour    Equipment Recommendations  Other (comment) (TBD at next venue)    Recommendations for Other Services       Precautions / Restrictions Precautions Precautions: Fall Precaution Comments: rectal pouch; frequently leaking Restrictions Weight Bearing Restrictions: No      Mobility Bed Mobility               General bed mobility comments: Pt OOB in chair upon arrival.  Transfers Overall transfer level: Needs assistance Equipment used: 1 person hand held assist Transfers: Sit to/from Stand Sit to Stand: Min assist;+2 safety/equipment         General transfer comment: Min steadying assist.    Balance Overall balance assessment: Needs assistance Sitting-balance support: Feet supported;No upper extremity supported Sitting balance-Leahy Scale: Fair     Standing balance support: Single extremity supported;During functional activity Standing balance-Leahy Scale: Fair                              ADL Overall ADL's : Needs  assistance/impaired Eating/Feeding: Set up;Sitting   Grooming: Set up;Supervision/safety;Sitting   Upper Body Bathing: Minimal assistance;Sitting   Lower Body Bathing: Moderate assistance;Sit to/from stand   Upper Body Dressing : Minimal assistance;Sitting   Lower Body Dressing: Moderate assistance;Sit to/from stand   Toilet Transfer: Minimal assistance;+2 for safety/equipment;Ambulation;BSC   Toileting- Clothing Manipulation and Hygiene: Maximal assistance;Sit to/from stand Toileting - Clothing Manipulation Details (indicate cue type and reason): for cleaning due to rectal pouch leaking     Functional mobility during ADLs: Minimal assistance;+2 for safety/equipment General ADL Comments: SpO2 in mid-low 80s on RA prior to activity; per RN applied 3L O2 with rise to mid 90s.     Vision     Perception     Praxis      Pertinent Vitals/Pain Pain Assessment: No/denies pain     Hand Dominance Right   Extremity/Trunk Assessment Upper Extremity Assessment Upper Extremity Assessment: Generalized weakness   Lower Extremity Assessment Lower Extremity Assessment: Defer to PT evaluation   Cervical / Trunk Assessment Cervical / Trunk Assessment: Normal   Communication Communication Communication: No difficulties   Cognition Arousal/Alertness: Awake/alert Behavior During Therapy: Flat affect Overall Cognitive Status: Within Functional Limits for tasks assessed                     General Comments       Exercises       Shoulder Instructions      Home Living Family/patient expects to be discharged to:: Private residence Living Arrangements: Alone Available Help at Discharge: Family;Friend(s);Available PRN/intermittently  Type of Home: Apartment Home Access: Level entry     Home Layout: One level     Bathroom Shower/Tub: Tub/shower unit Shower/tub characteristics: Curtain Biochemist, clinical: Handicapped height     Home Equipment: Grab bars -  tub/shower;Hand held shower head          Prior Functioning/Environment Level of Independence: Independent        Comments: Recently has stopped driving. Does do cooking and cleaning. Concerned about how she will get to the grocery store; sister and friend may be able to help        OT Problem List: Decreased strength;Decreased activity tolerance;Impaired balance (sitting and/or standing);Decreased knowledge of use of DME or AE;Decreased knowledge of precautions   OT Treatment/Interventions: Self-care/ADL training;Energy conservation;DME and/or AE instruction;Therapeutic activities;Patient/family education;Balance training    OT Goals(Current goals can be found in the care plan section) Acute Rehab OT Goals Patient Stated Goal: take a nap OT Goal Formulation: With patient Time For Goal Achievement: 07/31/16 Potential to Achieve Goals: Good ADL Goals Pt Will Perform Grooming: with supervision;standing Pt Will Perform Upper Body Bathing: with set-up;sitting Pt Will Perform Lower Body Bathing: with supervision;sit to/from stand Pt Will Perform Upper Body Dressing: with set-up;sitting Pt Will Perform Lower Body Dressing: with supervision;sit to/from stand Pt Will Transfer to Toilet: with supervision;ambulating;regular height toilet Pt Will Perform Toileting - Clothing Manipulation and hygiene: with supervision;sit to/from stand Additional ADL Goal #1: Pt will independently verbally recall 3 energy conservation strategies.  OT Frequency: Min 2X/week   Barriers to D/C: Decreased caregiver support  pt lives alone       Co-evaluation PT/OT/SLP Co-Evaluation/Treatment: Yes Reason for Co-Treatment: For patient/therapist safety   OT goals addressed during session: ADL's and self-care      End of Session Equipment Utilized During Treatment: Gait belt;Oxygen Nurse Communication: Mobility status;Other (comment) (SpO2 in 80s on RA)  Activity Tolerance: Patient limited by  fatigue Patient left: in chair;with call bell/phone within reach;with chair alarm set   Time: 1129-1200 OT Time Calculation (min): 31 min Charges:  OT General Charges $OT Visit: 1 Procedure OT Evaluation $OT Eval Moderate Complexity: 1 Procedure G-Codes:     Binnie Kand M.S., OTR/L Pager: [phone]  07/17/2016, 12:10 PM

## 2016-07-17 NOTE — Progress Notes (Signed)
OT Cancellation Note  Patient Details Name: Katie Lozano MRN: [id number] DOB: 1941/02/10   Cancelled Treatment:    Reason Eval/Treat Not Completed: Other (comment) (pt finishing breakfast, requesting OT check back later). Will follow up as time allows.  Binnie Kand M.S., OTR/L Pager: [phone]  07/17/2016, 10:32 AM

## 2016-07-18 LAB — CBC
HCT: 20.6 % — ABNORMAL LOW (ref 36.0–46.0)
HEMOGLOBIN: 7.2 g/dL — AB (ref 12.0–15.0)
MCH: 33 pg (ref 26.0–34.0)
MCHC: 35 g/dL (ref 30.0–36.0)
MCV: 94.5 fL (ref 78.0–100.0)
PLATELETS: 173 10*3/uL (ref 150–400)
RBC: 2.18 MIL/uL — AB (ref 3.87–5.11)
RDW: 14 % (ref 11.5–15.5)
WBC: 9.5 10*3/uL (ref 4.0–10.5)

## 2016-07-18 LAB — BASIC METABOLIC PANEL
ANION GAP: 7 (ref 5–15)
BUN: 8 mg/dL (ref 6–20)
CALCIUM: 7 mg/dL — AB (ref 8.9–10.3)
CO2: 22 mmol/L (ref 22–32)
Chloride: 109 mmol/L (ref 101–111)
Creatinine, Ser: 0.64 mg/dL (ref 0.44–1.00)
Glucose, Bld: 84 mg/dL (ref 65–99)
Potassium: 2.6 mmol/L — CL (ref 3.5–5.1)
SODIUM: 138 mmol/L (ref 135–145)

## 2016-07-18 LAB — GLUCOSE, CAPILLARY
GLUCOSE-CAPILLARY: 68 mg/dL (ref 65–99)
GLUCOSE-CAPILLARY: 159 mg/dL — AB (ref 65–99)
Glucose-Capillary: 148 mg/dL — ABNORMAL HIGH (ref 65–99)
Glucose-Capillary: 169 mg/dL — ABNORMAL HIGH (ref 65–99)
Glucose-Capillary: 144 mg/dL — ABNORMAL HIGH (ref 65–99)

## 2016-07-18 MED ORDER — DEXTROSE 50 % IV SOLN
25.0000 mL | Freq: Once | INTRAVENOUS | Status: AC
  Administered 2016-07-18: 25 mL via INTRAVENOUS
  Filled 2016-07-18: qty 50

## 2016-07-18 MED ORDER — POTASSIUM CHLORIDE CRYS ER 20 MEQ PO TBCR
40.0000 meq | EXTENDED_RELEASE_TABLET | Freq: Once | ORAL | Status: AC
  Administered 2016-07-18: 40 meq via ORAL
  Filled 2016-07-18: qty 2

## 2016-07-18 MED ORDER — SODIUM CHLORIDE 0.9 % IV SOLN
30.0000 meq | Freq: Once | INTRAVENOUS | Status: AC
  Administered 2016-07-18: 30 meq via INTRAVENOUS
  Filled 2016-07-18: qty 15

## 2016-07-18 NOTE — Progress Notes (Signed)
This AM patient had CBG 68; patient asymptomatic but she verbalized that she doesn't have appetite and she can't eat very well. Patient received Dextrose 50% - 25 ml and when rechecked, CBG was 148. MD made aware. Will continue to monitor.

## 2016-07-18 NOTE — Progress Notes (Signed)
Patient Demographics:    Katie Lozano, is a 76 y.o. female, DOB - [birth year], [id number]  Admit date - 07/11/2016   Admitting Physician Juanito Doom, MD  Outpatient Primary MD for the patient is Hoyt Koch, MD  LOS - 7   Chief Complaint  Patient presents with  . Altered Mental Status        Subjective:    Katie Lozano today has no fevers, no emesis,  No chest pain,  Sister and brother-in-law at bedside,   Assessment  & Plan :    Active Problems:   Diabetic ketoacidosis without coma associated with type 2 diabetes mellitus ([place])   AKI (acute kidney injury) ([place])   UGIB (upper gastrointestinal bleed)   Melena   Acute blood loss anemia   Encounter for central line placement   Metabolic acidosis   Pressure injury of skin  Interval summary:- 76 y/o female who presented with DKA, shock, AKI, NSTEMI, who developed an upper GI bleed with melena. 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 She also had erosive gastritis. H/o  DM2 and recent TIA who was admitted on 1/13 with acute encephalopathy from DKA after a viral gastroenteritis. This has been associated with shock, NSTEMI and lactic acidosis.     Plan:- 1)Acute Esophageal Necrosis - No hematemesis , tolerating liquid diet well, patient underwent EGD on 07/14/2016 with findings of acute esophageal necrosis,  Gastroenterologist suggest that Pt's acute esophageal necrosis was the result of DKA / shock, due to ischemic injury. As per gastroenterologist Depending on the underlying etiology, risk of mortality associated with this is upwards of 30%, with increased risk of esophageal perforation. Fortunately the etiology for her case is reversible and she's doing much better clinically, and hopefully this heals up well. Long term, there is  high rate of esophageal stricture formation which she will need to be evaluated for as outpatient. Given the rarity of AEN, there are no consensus management guidelines, although most sources recommend NPO for at least 24 hours, avoidance of NG tube if possible, and PPI and Carafate. Repeat EGD as outpatient to assess for healing and stricture formation  2)Hypoxia- use incentive spirometry, use supplemental oxygen,  prn bronchodilators  2)Possible NSTEMI- aspirin and anticoagulation are On hold due to concerns about GI bleed  3)Hypokalemia- due to diarrhea, replace and recheck  4)H/o Severe diverticulosis, s/p removal 3 tubular adenomas, sm int hemorrhoids (per colo in March 2017)-   5)Anemia/thrombocytopenia- +FOBT, drop in Hgb (9.4>7.8)- transfuse as clinically indicated   6)DM- resolved DKA, continue Lantus and Use Novolog/Humalog Sliding scale insulin with Accu-Cheks/Fingersticks as ordered  7)Generalized weakness/debility- continue physical therapy   Code Status : full   Disposition Plan  :  SNF  Consults  :  Gi  DVT Prophylaxis  :  SCDs    Lab Results  Component Value Date   PLT 173 07/18/2016    Inpatient Medications  Scheduled Meds: . insulin aspart  0-9 Units Subcutaneous TID WC  . insulin glargine  20 Units Subcutaneous Daily  . mouth rinse  15 mL Mouth Rinse BID  . pantoprazole (PROTONIX) IV  40 mg Intravenous Q12H  . sucralfate  1  g Oral Q6H   Continuous Infusions: . sodium chloride 100 mL/hr at 07/18/16 0027   PRN Meds:.    Anti-infectives    Start     Dose/Rate Route Frequency Ordered Stop   07/13/16 1600  vancomycin (VANCOCIN) IVPB 1000 mg/200 mL premix  Status:  Discontinued     1,000 mg 200 mL/hr over 60 Minutes Intravenous Every 48 hours 07/11/16 2224 07/12/16 1331   07/13/16 1300  piperacillin-tazobactam (ZOSYN) IVPB 3.375 g  Status:  Discontinued     3.375 g 12.5 mL/hr over 240 Minutes Intravenous Every 8 hours 07/13/16 0955  07/14/16 1108   07/12/16 1700  vancomycin (VANCOCIN) IVPB 750 mg/150 ml premix  Status:  Discontinued     750 mg 150 mL/hr over 60 Minutes Intravenous Every 24 hours 07/11/16 1621 07/11/16 2224   07/11/16 2300  piperacillin-tazobactam (ZOSYN) IVPB 3.375 g  Status:  Discontinued     3.375 g 12.5 mL/hr over 240 Minutes Intravenous Every 8 hours 07/11/16 1621 07/11/16 2224   07/11/16 2230  piperacillin-tazobactam (ZOSYN) IVPB 2.25 g  Status:  Discontinued     2.25 g 100 mL/hr over 30 Minutes Intravenous Every 8 hours 07/11/16 2224 07/13/16 0955   07/11/16 1530  vancomycin (VANCOCIN) 1,250 mg in sodium chloride 0.9 % 250 mL IVPB     1,250 mg 166.7 mL/hr over 90 Minutes Intravenous  Once 07/11/16 1501 07/12/16 0700   07/11/16 1500  piperacillin-tazobactam (ZOSYN) IVPB 3.375 g     3.375 g 100 mL/hr over 30 Minutes Intravenous  Once 07/11/16 1451 07/11/16 1706   07/11/16 1500  vancomycin (VANCOCIN) IVPB 1000 mg/200 mL premix  Status:  Discontinued     1,000 mg 200 mL/hr over 60 Minutes Intravenous  Once 07/11/16 1451 07/11/16 1501        Objective:   Vitals:   07/17/16 1140 07/17/16 1417 07/17/16 2238 07/18/16 0542  BP:  (!) 116/48 119/65 (!) 131/43  Pulse:  97 83 83  Resp:  18 18 18   Temp:  98.1 F (36.7 C) 98.7 F (37.1 C) 99.4 F (37.4 C)  TempSrc:  Oral Oral Oral  SpO2: 93% 93% 95% 100%  Weight:      Height:        Wt Readings from Last 3 Encounters:  07/15/16 72.3 kg (159 lb 8 oz)  06/06/16 68.7 kg (151 lb 7.3 oz)  09/04/15 67.6 kg (149 lb)     Intake/Output Summary (Last 24 hours) at 07/18/16 0958 Last data filed at 07/18/16 0645  Gross per 24 hour  Intake             2700 ml  Output                0 ml  Net             2700 ml     Physical Exam  Gen:- Awake Alert,  In no apparent distress  HEENT:- [place].AT, No sclera icterus Neck-Supple Neck,No JVD,.  Lungs-  CTAB  CV- S1, S2 normal Abd-  +ve B.Sounds, Abd Soft, No tenderness,    Extremity/Skin:- warm and  dry    Data Review:   Micro Results Recent Results (from the past 240 hour(s))  Blood Culture (routine x 2)     Status: None   Collection Time: 07/11/16  4:50 PM  Result Value Ref Range Status   Specimen Description BLOOD LEFT EJ  Final   Special Requests BOTTLES DRAWN AEROBIC AND ANAEROBIC 5CC  Final  Culture NO GROWTH 5 DAYS  Final   Report Status 07/16/2016 FINAL  Final  Blood Culture (routine x 2)     Status: Abnormal   Collection Time: 07/11/16  5:09 PM  Result Value Ref Range Status   Specimen Description BLOOD RIGHT HAND  Final   Special Requests AEROCOCCUS SPECIES 6CC (A)  Final   Culture NO GROWTH 5 DAYS  Final   Report Status 07/16/2016 FINAL  Final  MRSA PCR Screening     Status: None   Collection Time: 07/11/16  6:56 PM  Result Value Ref Range Status   MRSA by PCR NEGATIVE NEGATIVE Final    Comment:        The GeneXpert MRSA Assay (FDA approved for NASAL specimens only), is one component of a comprehensive MRSA colonization surveillance program. It is not intended to diagnose MRSA infection nor to guide or monitor treatment for MRSA infections.   Urine culture     Status: None   Collection Time: 07/11/16  8:23 PM  Result Value Ref Range Status   Specimen Description URINE, CATHETERIZED  Final   Special Requests NONE  Final   Culture NO GROWTH  Final   Report Status 07/13/2016 FINAL  Final    Radiology Reports Portable Chest X-ray (1 View)  Result Date: 07/11/2016 CLINICAL DATA:  Central line placement EXAM: PORTABLE CHEST 1 VIEW COMPARISON:  07/11/2016 FINDINGS: Multiple support leads obscure the chest. Insertion of left-sided central venous catheter, the tip is faintly visualized over the proximal right atrium. No pneumothorax. Low lung volumes without acute infiltrate. Mild subsegmental atelectasis at the left CP angle. Stable cardiomediastinal silhouette. Surgical clips in the right upper quadrant. IMPRESSION: Left-sided central venous catheter tip  overlies the proximal right atrium. No pneumothorax. Electronically Signed   By: Donavan Foil M.D.   On: 07/11/2016 16:45   Dg Chest Port 1 View  Result Date: 07/11/2016 CLINICAL DATA:  Unresponsive, nausea and vomiting EXAM: PORTABLE CHEST 1 VIEW COMPARISON:  06/05/2016 FINDINGS: Borderline cardiomegaly. Elevation of the right hemidiaphragm again noted. No infiltrate or pulmonary edema. Central mild vascular congestion. IMPRESSION: Cardiomegaly. Central mild vascular congestion without convincing pulmonary edema. No segmental infiltrate. Electronically Signed   By: Lahoma Crocker M.D.   On: 07/11/2016 15:34     CBC  Recent Labs Lab 07/11/16 1511  07/13/16 1258 07/14/16 0350 07/15/16 0500 07/16/16 0632 07/18/16 0430  WBC 23.4*  < > 13.2* 11.6* 7.6 8.7 9.5  HGB 11.9*  < > 8.0* 7.6* 7.1* 7.8* 7.2*  HCT 40.3  < > 22.3* 21.5* 20.4* 22.7* 20.6*  PLT 268  < > 85* 78* 83* 125* 173  MCV 110.1*  < > 91.8 91.9 93.6 94.6 94.5  MCH 32.5  < > 32.9 32.5 32.6 32.5 33.0  MCHC 29.5*  < > 35.9 35.3 34.8 34.4 35.0  RDW 13.8  < > 13.8 13.7 14.4 14.3 14.0  LYMPHSABS 4.0  --   --   --   --   --   --   MONOABS 3.0*  --   --   --   --   --   --   EOSABS 0.0  --   --   --   --   --   --   BASOSABS 0.2*  --   --   --   --   --   --   < > = values in this interval not displayed.  Chemistries   Recent Labs Lab 07/11/16  1511  07/11/16 1642  07/14/16 0350 07/14/16 1202 07/14/16 2036 07/15/16 0500 07/16/16 0632 07/17/16 1004 07/18/16 0430  NA 127*  < > 130*  < > 148*  --   --  148* 142 141 138  K 6.9*  < > 6.8*  < > 2.5* 3.2*  --  3.0* 3.0* 2.9* 2.6*  CL 85*  < > 95*  < > 113*  --   --  118* 113* 112* 109  CO2 <7*  --  8*  < > 26  --   --  26 22 20* 22  GLUCOSE 1,060*  < > 1,038*  < > 202*  --   --  105* 128* 103* 84  BUN 79*  < > 83*  < > 25*  --   --  16 14 11 8   CREATININE 3.34*  < > 3.39*  < > 0.88  --   --  0.62 0.68 0.72 0.64  CALCIUM 8.8*  --  8.1*  < > 7.3*  --   --  6.7* 7.0* 7.3* 7.0*  MG   --   --  2.6*  --  1.5*  --  2.4  --   --   --   --   AST 63*  --   --   --   --   --   --   --   --   --   --   ALT 29  --   --   --   --   --   --   --   --   --   --   ALKPHOS 85  --   --   --   --   --   --   --   --   --   --   BILITOT 1.2  --   --   --   --   --   --   --   --   --   --   < > = values in this interval not displayed. ------------------------------------------------------------------------------------------------------------------ No results for input(s): CHOL, HDL, LDLCALC, TRIG, CHOLHDL, LDLDIRECT in the last 72 hours.  Lab Results  Component Value Date   HGBA1C 8.8 (H) 07/11/2016   ------------------------------------------------------------------------------------------------------------------ No results for input(s): TSH, T4TOTAL, T3FREE, THYROIDAB in the last 72 hours.  Invalid input(s): FREET3 ------------------------------------------------------------------------------------------------------------------ No results for input(s): VITAMINB12, FOLATE, FERRITIN, TIBC, IRON, RETICCTPCT in the last 72 hours.  Coagulation profile No results for input(s): INR, PROTIME in the last 168 hours.  No results for input(s): DDIMER in the last 72 hours.  Cardiac Enzymes  Recent Labs Lab 07/12/16 1243 07/12/16 1837 07/13/16 0025  TROPONINI 0.91* 0.82* 0.65*   ------------------------------------------------------------------------------------------------------------------ No results found for: BNP   Toini Failla M.D on 07/18/2016 at 9:58 AM  Between 7am to 7pm - Pager - [phone]  After 7pm go to [email or web address] - password [email or web address]  [facility] -  Office  [phone]  Dragon dictation system was used to create this note, attempts have been made to correct errors, however presence of uncorrected errors is not a reflection quality of care provided

## 2016-07-18 NOTE — Progress Notes (Signed)
CSW spoke with patient regarding snf. Patient has had home health PT before, but she would rather go stay with her sister than stay at a snf. CSW explained that we would have snf options available if she changed her mind.  CSW signed off.  Percell Locus Terris Germano LCSWA [phone]

## 2016-07-18 NOTE — Progress Notes (Signed)
Inpatient Diabetes Program Recommendations  AACE/ADA: New Consensus Statement on Inpatient Glycemic Control (2015)  Target Ranges:  Prepandial:   less than 140 mg/dL      Peak postprandial:   less than 180 mg/dL (1-2 hours)      Critically ill patients:  140 - 180 mg/dL   Lab Results  Component Value Date   GLUCAP 148 (H) 07/18/2016   HGBA1C 8.8 (H) 07/11/2016   Results for Katie Lozano, Katie Lozano (MRN [id number]) as of 07/18/2016 10:05  Ref. Range 07/17/2016 07:40 07/17/2016 12:22 07/17/2016 17:13 07/17/2016 21:30 07/17/2016 22:15 07/18/2016 07:53 07/18/2016 08:46  Glucose-Capillary Latest Ref Range: 65 - 99 mg/dL 81 217 (H) 181 (H) 158 (H) 153 (H) 68 148 (H)   Current orders for Inpatient glycemic control:    Novolog 0-9 units TIDAC correction SSI    Lantus 20 units daily  Inpatient Diabetes Program Recommendations:    Noted low CBG's in am 2 days in a row.  Please consider decreasing Lantus to 17 units daily starting today.    Noted increased CBG's after meals.  Please consider adding meal coverage of 3 units TIDAC if eats > 50% of meal.  Thank you,  Windy Carina, RN, MSN Diabetes Coordinator Inpatient Diabetes Program [phone] (Team Pager)

## 2016-07-18 NOTE — Clinical Social Work Note (Signed)
Clinical Social Work Assessment  Patient Details  Name: Katie Lozano MRN: [id number] Date of Birth: 1941-02-24  Date of referral:  07/18/16               Reason for consult:  Facility Placement                Permission sought to share information with:  Facility Sport and exercise psychologist, Family Supports Permission granted to share information::  Yes, Verbal Permission Granted  Name::     Systems analyst::  SNFs  Relationship::  Sister  Contact Information:     Housing/Transportation Living arrangements for the past 2 months:  Single Family Home Source of Information:  Patient, Other (Comment Required) Patient Interpreter Needed:  None Criminal Activity/Legal Involvement Pertinent to Current Situation/Hospitalization:  No - Comment as needed Significant Relationships:  Siblings Lives with:  Self Do you feel safe going back to the place where you live?  No Need for family participation in patient care:  Yes (Comment)  Care giving concerns:  CSW received consult for possible SNF placement at time of discharge. CSW spoke with patient regarding PT recommendation of SNF placement at time of discharge. Patient reported that she lives alone and would like her sister to help make the decisions. Patient and patient's sister (and brother) expressed understanding of PT recommendation and are agreeable to SNF placement at time of discharge. CSW to continue to follow and assist with discharge planning needs.   Social Worker assessment / plan:  CSW spoke with patient concerning possibility of rehab at [facility] before returning home.  Employment status:  Other (Comment) Insurance information:  Managed Care PT Recommendations:  [place] / Referral to community resources:  [facility]  Patient/Family's Response to care:  Patient recognizes need for rehab before returning home and is agreeable to a SNF in [place]. Patient's sister also asked CSW about  filling out HCPOA paperwork. CSW directed them to Chaplain.   Patient/Family's Understanding of and Emotional Response to Diagnosis, Current Treatment, and Prognosis:  Patient/family is realistic regarding therapy needs and expressed being hopeful for SNF placement. Patient expressed understanding of CSW role and discharge process. No questions/concerns about plan or treatment.    Emotional Assessment Appearance:  Appears stated age Attitude/Demeanor/Rapport:  Other (Appropriate) Affect (typically observed):  Accepting, Appropriate Orientation:  Oriented to Self, Oriented to Situation, Oriented to Place, Oriented to  Time Alcohol / Substance use:  Not Applicable Psych involvement (Current and /or in the community):  No (Comment)  Discharge Needs  Concerns to be addressed:  Care Coordination Readmission within the last 30 days:  No Current discharge risk:  None Barriers to Discharge:  Continued Medical Work up   [employer], [employer] 07/18/2016, 3:10 PM

## 2016-07-18 NOTE — Progress Notes (Signed)
CRITICAL VALUE ALERT  Critical value received: Potassium 2.6  Date of notification:  07/18/16   Time of notification:  [id number]  Critical value read back:Yes.    Nurse who received alert:  Benay Pike  MD notified (1st page):  L.  [employer] NP   Time of first page:  [phone]  MD notified (2nd page): N/A  Time of second page: N/A  Responding MD:  Vivien Presto NP  Time MD responded:  [phone]

## 2016-07-18 NOTE — Care Management Important Message (Signed)
Important Message  Patient Details  Name: Katie Lozano MRN: [id number] Date of Birth: 1941/05/10   Medicare Important Message Given:  Yes    Nathen May 07/18/2016, 5:22 PM

## 2016-07-18 NOTE — Progress Notes (Signed)
      Progress Note   Subjective  Patient tolerated full liquids yesterday, although appetite is down. Some very mild odynophagia with some swallows, otherwise did well. No symptoms of bleeding.    Objective   Vital signs in last 24 hours: Temp:  [98.1 F (36.7 C)-99.4 F (37.4 C)] 99.4 F (37.4 C) (01/19 0542) Pulse Rate:  [83-97] 83 (01/19 0542) Resp:  [18] 18 (01/19 0542) BP: (116-131)/(43-65) 131/43 (01/19 0542) SpO2:  [85 %-100 %] 100 % (01/19 0542) Last BM Date: 07/17/16 General:    white female in NAD Heart:  Regular rate and rhythm;  Lungs: Respirations even and unlabored,  Abdomen:  Soft, nontender and nondistended. . Extremities:  Without edema. Neurologic:  Alert and oriented,  grossly normal neurologically. Psych:  Cooperative. Normal mood and affect.  Intake/Output from previous day: 01/18 0701 - 01/19 0700 In: 2700 [P.O.:60; I.V.:2375; IV Piggyback:265] Out: -  Intake/Output this shift: No intake/output data recorded.  Lab Results:  Recent Labs  07/16/16 0632 07/18/16 0430  WBC 8.7 9.5  HGB 7.8* 7.2*  HCT 22.7* 20.6*  PLT 125* 173   BMET  Recent Labs  07/16/16 0632 07/17/16 1004 07/18/16 0430  NA 142 141 138  K 3.0* 2.9* 2.6*  CL 113* 112* 109  CO2 22 20* 22  GLUCOSE 128* 103* 84  BUN 14 11 8   CREATININE 0.68 0.72 0.64  CALCIUM 7.0* 7.3* 7.0*   LFT No results for input(s): PROT, ALBUMIN, AST, ALT, ALKPHOS, BILITOT, BILIDIR, IBILI in the last 72 hours. PT/INR No results for input(s): LABPROT, INR in the last 72 hours.  Studies/Results: No results found.     Assessment / Plan:   76 y/o female who initially presented with DKA, shock, AKI, NSTEMI, then developed an upper GI bleed with melena. 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 She also had erosive gastritis. H pylori negative.  See prior note for full discussion of AEN. Overall she is improved, tolerating  full liquids, Hgb fluctuating, BUN normalizing, no symptoms of bleeding  At this time recommend the following: - soft diet today, would not advance past this while inpatient - continue protonix 40mg  BID - continue liquid carafate 10cc po q 6 hours - head of bed elevated at all times, sit upright with PO intake - she will need repeat EGD as outpatient following discharge to check for interval healing and assess for stricture formation - upon discharge she will need protonix 40mg  PO twice daily and would continue carafate. Carafate will make stools dark  We will follow peripherally for now, please call with questions or changes in her status.   Barnum Cellar, MD [facility] Gastroenterology Pager [phone]

## 2016-07-18 NOTE — Progress Notes (Addendum)
Patient Demographics:    Katie Lozano, is a 76 y.o. female, DOB - 12/20/1940, [id number]  Admit date - 07/11/2016   Admitting Physician Juanito Doom, MD  Outpatient Primary MD for the patient is Hoyt Koch, MD  LOS - 7   Chief Complaint  Patient presents with  . Altered Mental Status        Subjective:    [name] today has no fevers, no emesis,  No chest pain,  Stools are loose , Patient was seen and evaluated on 07/17/2016  Assessment  & Plan :    Active Problems:   Diabetic ketoacidosis without coma associated with type 2 diabetes mellitus ([place])   AKI (acute kidney injury) ([place])   UGIB (upper gastrointestinal bleed)   Melena   Acute blood loss anemia   Encounter for central line placement   Metabolic acidosis   Pressure injury of skin  Interval summary:- 76 y/o female who presented with DKA, shock, AKI, NSTEMI, who developed an upper GI bleed with melena. 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 She also had erosive gastritis. H/o  DM2 and recent TIA who was admitted on 1/13 with acute encephalopathy from DKA after a viral gastroenteritis. This has been associated with shock, NSTEMI and lactic acidosis.     Plan:-  1) acute esophageal necrosis - patient underwent EGD on 07/14/2016 with findings of acute esophageal necrosis,  Gastroenterologist suggest that Pt's acute esophageal necrosis was the result of DKA / shock, due to ischemic injury. As per gastroenterologist Depending on the underlying etiology, risk of mortality associated with this is upwards of 30%, with increased risk of esophageal perforation. Fortunately the etiology for her case is reversible and she's doing much better clinically, and hopefully this heals up well. Long term, there is high rate of  esophageal stricture formation which she will need to be evaluated for as outpatient. Given the rarity of AEN, there are no consensus management guidelines, although most sources recommend NPO for at least 24 hours, avoidance of NG tube if possible, and PPI. Carafate was ordered which may cause diarrhea and dark stools. Liquid diet and advance slowly, Repeat EGD as outpatient to assess for healing and stricture formation   2)Hypoxia- use incentive spirometry, use supplemental oxygen,  prn bronchodilators  2)Possible NSTEMI- aspirin and anticoagulation are On hold due to concerns about GI bleed  3)Hypokalemia- due to diarrhea, replace and recheck  4)H/o Severe diverticulosis, s/p removal 3 tubular adenomas, sm int hemorrhoids (per colo in March 2017)-   5)Anemia/thrombocytopenia- +FOBT, drop in Hgb (9.4>7.8)- transfuse as clinically indicated   6)DM- resolved DKA, continue Lantus and Use Novolog/Humalog Sliding scale insulin with Accu-Cheks/Fingersticks as ordered  7)Generalized weakness/debility- continue physical therapy   Code Status : full   Disposition Plan  :  SNF  Consults  :  Gi  DVT Prophylaxis  :  SCDs     Lab Results  Component Value Date   PLT 173 07/18/2016    Inpatient Medications  Scheduled Meds: . insulin aspart  0-9 Units Subcutaneous TID WC  . insulin glargine  20 Units Subcutaneous Daily  . mouth rinse  15 mL Mouth Rinse BID  .  pantoprazole (PROTONIX) IV  40 mg Intravenous Q12H  . sucralfate  1 g Oral Q6H   Continuous Infusions: . sodium chloride 100 mL/hr at 07/18/16 0027   PRN Meds:.    Anti-infectives    Start     Dose/Rate Route Frequency Ordered Stop   07/13/16 1600  vancomycin (VANCOCIN) IVPB 1000 mg/200 mL premix  Status:  Discontinued     1,000 mg 200 mL/hr over 60 Minutes Intravenous Every 48 hours 07/11/16 2224 07/12/16 1331   07/13/16 1300  piperacillin-tazobactam (ZOSYN) IVPB 3.375 g  Status:  Discontinued     3.375  g 12.5 mL/hr over 240 Minutes Intravenous Every 8 hours 07/13/16 0955 07/14/16 1108   07/12/16 1700  vancomycin (VANCOCIN) IVPB 750 mg/150 ml premix  Status:  Discontinued     750 mg 150 mL/hr over 60 Minutes Intravenous Every 24 hours 07/11/16 1621 07/11/16 2224   07/11/16 2300  piperacillin-tazobactam (ZOSYN) IVPB 3.375 g  Status:  Discontinued     3.375 g 12.5 mL/hr over 240 Minutes Intravenous Every 8 hours 07/11/16 1621 07/11/16 2224   07/11/16 2230  piperacillin-tazobactam (ZOSYN) IVPB 2.25 g  Status:  Discontinued     2.25 g 100 mL/hr over 30 Minutes Intravenous Every 8 hours 07/11/16 2224 07/13/16 0955   07/11/16 1530  vancomycin (VANCOCIN) 1,250 mg in sodium chloride 0.9 % 250 mL IVPB     1,250 mg 166.7 mL/hr over 90 Minutes Intravenous  Once 07/11/16 1501 07/12/16 0700   07/11/16 1500  piperacillin-tazobactam (ZOSYN) IVPB 3.375 g     3.375 g 100 mL/hr over 30 Minutes Intravenous  Once 07/11/16 1451 07/11/16 1706   07/11/16 1500  vancomycin (VANCOCIN) IVPB 1000 mg/200 mL premix  Status:  Discontinued     1,000 mg 200 mL/hr over 60 Minutes Intravenous  Once 07/11/16 1451 07/11/16 1501        Objective:   Vitals:   07/17/16 1140 07/17/16 1417 07/17/16 2238 07/18/16 0542  BP:  (!) 116/48 119/65 (!) 131/43  Pulse:  97 83 83  Resp:  18 18 18   Temp:  98.1 F (36.7 C) 98.7 F (37.1 C) 99.4 F (37.4 C)  TempSrc:  Oral Oral Oral  SpO2: 93% 93% 95% 100%  Weight:      Height:        Wt Readings from Last 3 Encounters:  07/15/16 72.3 kg (159 lb 8 oz)  06/06/16 68.7 kg (151 lb 7.3 oz)  09/04/15 67.6 kg (149 lb)     Intake/Output Summary (Last 24 hours) at 07/18/16 0956 Last data filed at 07/18/16 0645  Gross per 24 hour  Intake             2700 ml  Output                0 ml  Net             2700 ml     Physical Exam  Gen:- Awake Alert,  In no apparent distress  HEENT:- [place].AT, No sclera icterus Neck-Supple Neck,No JVD,.  Lungs-  CTAB  CV- S1, S2 normal Abd-   +ve B.Sounds, Abd Soft, No tenderness,    Extremity/Skin:- No  edema,    Rectal- tube with liquid stool    Data Review:   Micro Results Recent Results (from the past 240 hour(s))  Blood Culture (routine x 2)     Status: None   Collection Time: 07/11/16  4:50 PM  Result Value Ref Range Status  Specimen Description BLOOD LEFT EJ  Final   Special Requests BOTTLES DRAWN AEROBIC AND ANAEROBIC 5CC  Final   Culture NO GROWTH 5 DAYS  Final   Report Status 07/16/2016 FINAL  Final  Blood Culture (routine x 2)     Status: Abnormal   Collection Time: 07/11/16  5:09 PM  Result Value Ref Range Status   Specimen Description BLOOD RIGHT HAND  Final   Special Requests AEROCOCCUS SPECIES 6CC (A)  Final   Culture NO GROWTH 5 DAYS  Final   Report Status 07/16/2016 FINAL  Final  MRSA PCR Screening     Status: None   Collection Time: 07/11/16  6:56 PM  Result Value Ref Range Status   MRSA by PCR NEGATIVE NEGATIVE Final    Comment:        The GeneXpert MRSA Assay (FDA approved for NASAL specimens only), is one component of a comprehensive MRSA colonization surveillance program. It is not intended to diagnose MRSA infection nor to guide or monitor treatment for MRSA infections.   Urine culture     Status: None   Collection Time: 07/11/16  8:23 PM  Result Value Ref Range Status   Specimen Description URINE, CATHETERIZED  Final   Special Requests NONE  Final   Culture NO GROWTH  Final   Report Status 07/13/2016 FINAL  Final    Radiology Reports Portable Chest X-ray (1 View)  Result Date: 07/11/2016 CLINICAL DATA:  Central line placement EXAM: PORTABLE CHEST 1 VIEW COMPARISON:  07/11/2016 FINDINGS: Multiple support leads obscure the chest. Insertion of left-sided central venous catheter, the tip is faintly visualized over the proximal right atrium. No pneumothorax. Low lung volumes without acute infiltrate. Mild subsegmental atelectasis at the left CP angle. Stable cardiomediastinal  silhouette. Surgical clips in the right upper quadrant. IMPRESSION: Left-sided central venous catheter tip overlies the proximal right atrium. No pneumothorax. Electronically Signed   By: Donavan Foil M.D.   On: 07/11/2016 16:45   Dg Chest Port 1 View  Result Date: 07/11/2016 CLINICAL DATA:  Unresponsive, nausea and vomiting EXAM: PORTABLE CHEST 1 VIEW COMPARISON:  06/05/2016 FINDINGS: Borderline cardiomegaly. Elevation of the right hemidiaphragm again noted. No infiltrate or pulmonary edema. Central mild vascular congestion. IMPRESSION: Cardiomegaly. Central mild vascular congestion without convincing pulmonary edema. No segmental infiltrate. Electronically Signed   By: Lahoma Crocker M.D.   On: 07/11/2016 15:34     CBC  Recent Labs Lab 07/11/16 1511  07/13/16 1258 07/14/16 0350 07/15/16 0500 07/16/16 0632 07/18/16 0430  WBC 23.4*  < > 13.2* 11.6* 7.6 8.7 9.5  HGB 11.9*  < > 8.0* 7.6* 7.1* 7.8* 7.2*  HCT 40.3  < > 22.3* 21.5* 20.4* 22.7* 20.6*  PLT 268  < > 85* 78* 83* 125* 173  MCV 110.1*  < > 91.8 91.9 93.6 94.6 94.5  MCH 32.5  < > 32.9 32.5 32.6 32.5 33.0  MCHC 29.5*  < > 35.9 35.3 34.8 34.4 35.0  RDW 13.8  < > 13.8 13.7 14.4 14.3 14.0  LYMPHSABS 4.0  --   --   --   --   --   --   MONOABS 3.0*  --   --   --   --   --   --   EOSABS 0.0  --   --   --   --   --   --   BASOSABS 0.2*  --   --   --   --   --   --   < > =  values in this interval not displayed.  Chemistries   Recent Labs Lab 07/11/16 1511  07/11/16 1642  07/14/16 0350 07/14/16 1202 07/14/16 2036 07/15/16 0500 07/16/16 0632 07/17/16 1004 07/18/16 0430  NA 127*  < > 130*  < > 148*  --   --  148* 142 141 138  K 6.9*  < > 6.8*  < > 2.5* 3.2*  --  3.0* 3.0* 2.9* 2.6*  CL 85*  < > 95*  < > 113*  --   --  118* 113* 112* 109  CO2 <7*  --  8*  < > 26  --   --  26 22 20* 22  GLUCOSE 1,060*  < > 1,038*  < > 202*  --   --  105* 128* 103* 84  BUN 79*  < > 83*  < > 25*  --   --  16 14 11 8   CREATININE 3.34*  < > 3.39*  <  > 0.88  --   --  0.62 0.68 0.72 0.64  CALCIUM 8.8*  --  8.1*  < > 7.3*  --   --  6.7* 7.0* 7.3* 7.0*  MG  --   --  2.6*  --  1.5*  --  2.4  --   --   --   --   AST 63*  --   --   --   --   --   --   --   --   --   --   ALT 29  --   --   --   --   --   --   --   --   --   --   ALKPHOS 85  --   --   --   --   --   --   --   --   --   --   BILITOT 1.2  --   --   --   --   --   --   --   --   --   --   < > = values in this interval not displayed. ------------------------------------------------------------------------------------------------------------------ No results for input(s): CHOL, HDL, LDLCALC, TRIG, CHOLHDL, LDLDIRECT in the last 72 hours.  Lab Results  Component Value Date   HGBA1C 8.8 (H) 07/11/2016   ------------------------------------------------------------------------------------------------------------------ No results for input(s): TSH, T4TOTAL, T3FREE, THYROIDAB in the last 72 hours.  Invalid input(s): FREET3 ------------------------------------------------------------------------------------------------------------------ No results for input(s): VITAMINB12, FOLATE, FERRITIN, TIBC, IRON, RETICCTPCT in the last 72 hours.  Coagulation profile No results for input(s): INR, PROTIME in the last 168 hours.  No results for input(s): DDIMER in the last 72 hours.  Cardiac Enzymes  Recent Labs Lab 07/12/16 1243 07/12/16 1837 07/13/16 0025  TROPONINI 0.91* 0.82* 0.65*   ------------------------------------------------------------------------------------------------------------------ No results found for: BNP  Patient was seen and evaluated on 07/17/2016  Denton Brick Shanedra Lave M.D on 07/17/2016 at 9:56 AM  Between 7am to 7pm - Pager - [phone]  After 7pm go to [email or web address] - password [email or web address]  [facility] -  Office  [phone]  Dragon dictation system was used to create this note, attempts have been made to correct errors, however presence of uncorrected  errors is not a reflection quality of care provided

## 2016-07-19 LAB — BASIC METABOLIC PANEL
ANION GAP: 5 (ref 5–15)
BUN: <5 mg/dL — ABNORMAL LOW (ref 6–20)
CALCIUM: 7.2 mg/dL — AB (ref 8.9–10.3)
CO2: 20 mmol/L — ABNORMAL LOW (ref 22–32)
Chloride: 113 mmol/L — ABNORMAL HIGH (ref 101–111)
Creatinine, Ser: 0.62 mg/dL (ref 0.44–1.00)
Glucose, Bld: 128 mg/dL — ABNORMAL HIGH (ref 65–99)
POTASSIUM: 3.3 mmol/L — AB (ref 3.5–5.1)
SODIUM: 138 mmol/L (ref 135–145)

## 2016-07-19 LAB — CBC
HEMATOCRIT: 20.8 % — AB (ref 36.0–46.0)
Hemoglobin: 7.1 g/dL — ABNORMAL LOW (ref 12.0–15.0)
MCH: 32.6 pg (ref 26.0–34.0)
MCHC: 34.1 g/dL (ref 30.0–36.0)
MCV: 95.4 fL (ref 78.0–100.0)
Platelets: 186 10*3/uL (ref 150–400)
RBC: 2.18 MIL/uL — AB (ref 3.87–5.11)
RDW: 15.1 % (ref 11.5–15.5)
WBC: 11.1 10*3/uL — AB (ref 4.0–10.5)

## 2016-07-19 LAB — GLUCOSE, CAPILLARY
GLUCOSE-CAPILLARY: 131 mg/dL — AB (ref 65–99)
GLUCOSE-CAPILLARY: 126 mg/dL — AB (ref 65–99)
Glucose-Capillary: 113 mg/dL — ABNORMAL HIGH (ref 65–99)
Glucose-Capillary: 164 mg/dL — ABNORMAL HIGH (ref 65–99)
Glucose-Capillary: 112 mg/dL — ABNORMAL HIGH (ref 65–99)

## 2016-07-19 LAB — MAGNESIUM: MAGNESIUM: 1.3 mg/dL — AB (ref 1.7–2.4)

## 2016-07-19 LAB — PHOSPHORUS: Phosphorus: 2.2 mg/dL — ABNORMAL LOW (ref 2.5–4.6)

## 2016-07-19 LAB — PREPARE RBC (CROSSMATCH)

## 2016-07-19 MED ORDER — FUROSEMIDE 10 MG/ML IJ SOLN
40.0000 mg | Freq: Once | INTRAMUSCULAR | Status: AC
  Administered 2016-07-20: 40 mg via INTRAVENOUS
  Filled 2016-07-19: qty 4

## 2016-07-19 MED ORDER — SODIUM CHLORIDE 0.9 % IV SOLN: Freq: Once | INTRAVENOUS | Status: DC

## 2016-07-19 NOTE — Progress Notes (Signed)
Patient Demographics:    Katie Lozano, is a 76 y.o. female, DOB - 1940/11/24, [id number]  Admit date - 07/11/2016   Admitting Physician Juanito Doom, MD  Outpatient Primary MD for the patient is Hoyt Koch, MD  LOS - 8   Chief Complaint  Patient presents with  . Altered Mental Status        Subjective:    Katie Lozano today has no fevers, no emesis,  No chest pain,  Stools are brown   Assessment  & Plan :    Active Problems:   Diabetic ketoacidosis without coma associated with type 2 diabetes mellitus ([place])   AKI (acute kidney injury) ([place])   UGIB (upper gastrointestinal bleed)   Melena   Acute blood loss anemia   Encounter for central line placement   Metabolic acidosis   Pressure injury of skin  Interval summary:- 76 y/o female who presented with DKA, shock, AKI, NSTEMI, who developed an upper GI bleed with melena. 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 She also had erosive gastritis. H/o  DM2 and recent TIA who was admitted on 1/13 with acute encephalopathy from DKA after a viral gastroenteritis. This has been associated with shock, NSTEMI and lactic acidosis.    Plan:- 1)Acute Esophageal Necrosis - No hematemesis , tolerating liquid diet well, patient underwent EGD on 07/14/2016 with findings of acute esophageal necrosis,  Gastroenterologist suggest that Pt's acute esophageal necrosis was the result of DKA / shock, due to ischemic injury. As per gastroenterologist Depending on the underlying etiology, risk of mortality associated with this is upwards of 30%, with increased risk of esophageal perforation. Fortunately the etiology for her case is reversible and she's doing much better clinically, and hopefully this heals up well. Long term, there is high rate of esophageal  stricture formation which she will need to be evaluated for as outpatient. Given the rarity of AEN, there are no consensus management guidelines, although most sources recommend NPO for at least 24 hours, avoidance of NG tube if possible, and Protonix twice a day and Carafate 1 g every 6 hours. Repeat EGD as outpatient to assess for healing and stricture formation  2)Hypoxia- use incentive spirometry, use supplemental oxygen,  prn bronchodilators  2)Possible NSTEMI- aspirin and anticoagulation are On hold due to concerns about GI bleed  3)Hypokalemia- due to diarrhea, replace and recheck  4)H/o Severe diverticulosis, s/p removal 3 tubular adenomas, sm int hemorrhoids (per colo in March 2017)-   5)Anemia/thrombocytopenia- +FOBT, drop in Hgb (9.4>7.8)-  transfuse 1 unit of packed red blood cells as hematocrit is below 21, patient has some tachycardia earlier and dyspnea on exertion (most likely related to the anemia) 6)DM- resolved DKA, continue Lantus and Use Novolog/Humalog Sliding scale insulin with Accu-Cheks/Fingersticks as ordered  7)Generalized weakness/debility- continue physical therapy   Code Status : full   Disposition Plan  :  SNF  Consults  :  Gi  DVT Prophylaxis  :  SCDs      Lab Results  Component Value Date   PLT 186 07/19/2016    Inpatient Medications  Scheduled Meds: . sodium chloride   Intravenous Once  . furosemide  40 mg  Intravenous Once  . insulin aspart  0-9 Units Subcutaneous TID WC  . insulin glargine  20 Units Subcutaneous Daily  . mouth rinse  15 mL Mouth Rinse BID  . pantoprazole (PROTONIX) IV  40 mg Intravenous Q12H  . sucralfate  1 g Oral Q6H   Continuous Infusions: . sodium chloride 100 mL/hr at 07/19/16 1105   PRN Meds:.    Anti-infectives    Start     Dose/Rate Route Frequency Ordered Stop   07/13/16 1600  vancomycin (VANCOCIN) IVPB 1000 mg/200 mL premix  Status:  Discontinued     1,000 mg 200 mL/hr over 60 Minutes  Intravenous Every 48 hours 07/11/16 2224 07/12/16 1331   07/13/16 1300  piperacillin-tazobactam (ZOSYN) IVPB 3.375 g  Status:  Discontinued     3.375 g 12.5 mL/hr over 240 Minutes Intravenous Every 8 hours 07/13/16 0955 07/14/16 1108   07/12/16 1700  vancomycin (VANCOCIN) IVPB 750 mg/150 ml premix  Status:  Discontinued     750 mg 150 mL/hr over 60 Minutes Intravenous Every 24 hours 07/11/16 1621 07/11/16 2224   07/11/16 2300  piperacillin-tazobactam (ZOSYN) IVPB 3.375 g  Status:  Discontinued     3.375 g 12.5 mL/hr over 240 Minutes Intravenous Every 8 hours 07/11/16 1621 07/11/16 2224   07/11/16 2230  piperacillin-tazobactam (ZOSYN) IVPB 2.25 g  Status:  Discontinued     2.25 g 100 mL/hr over 30 Minutes Intravenous Every 8 hours 07/11/16 2224 07/13/16 0955   07/11/16 1530  vancomycin (VANCOCIN) 1,250 mg in sodium chloride 0.9 % 250 mL IVPB     1,250 mg 166.7 mL/hr over 90 Minutes Intravenous  Once 07/11/16 1501 07/12/16 0700   07/11/16 1500  piperacillin-tazobactam (ZOSYN) IVPB 3.375 g     3.375 g 100 mL/hr over 30 Minutes Intravenous  Once 07/11/16 1451 07/11/16 1706   07/11/16 1500  vancomycin (VANCOCIN) IVPB 1000 mg/200 mL premix  Status:  Discontinued     1,000 mg 200 mL/hr over 60 Minutes Intravenous  Once 07/11/16 1451 07/11/16 1501        Objective:   Vitals:   07/18/16 1452 07/18/16 2110 07/19/16 0612 07/19/16 1447  BP: (!) 113/58 100/79 (!) 133/48 (!) 144/47  Pulse:  91 83 88  Resp:  18 18 18   Temp:  97.5 F (36.4 C) 98.2 F (36.8 C) 98 F (36.7 C)  TempSrc:  Oral  Oral  SpO2:  95% 93% 96%  Weight:      Height:        Wt Readings from Last 3 Encounters:  07/15/16 72.3 kg (159 lb 8 oz)  06/06/16 68.7 kg (151 lb 7.3 oz)  09/04/15 67.6 kg (149 lb)     Intake/Output Summary (Last 24 hours) at 07/19/16 1832 Last data filed at 07/19/16 1448  Gross per 24 hour  Intake             1290 ml  Output                0 ml  Net             1290 ml     Physical  Exam  Gen:- Awake Alert,  In no apparent distress , sister at bedside HEENT:- [place].AT, No sclera icterus Neck-Supple Neck,No JVD,.  Lungs-  CTAB  CV- S1, S2 normal Abd-  +ve B.Sounds, Abd Soft, No tenderness,    Extremity/Skin:- warm and dry    Data Review:   Micro Results Recent Results (from the  past 240 hour(s))  Blood Culture (routine x 2)     Status: None   Collection Time: 07/11/16  4:50 PM  Result Value Ref Range Status   Specimen Description BLOOD LEFT EJ  Final   Special Requests BOTTLES DRAWN AEROBIC AND ANAEROBIC 5CC  Final   Culture NO GROWTH 5 DAYS  Final   Report Status 07/16/2016 FINAL  Final  Blood Culture (routine x 2)     Status: Abnormal   Collection Time: 07/11/16  5:09 PM  Result Value Ref Range Status   Specimen Description BLOOD RIGHT HAND  Final   Special Requests AEROCOCCUS SPECIES 6CC (A)  Final   Culture NO GROWTH 5 DAYS  Final   Report Status 07/16/2016 FINAL  Final  MRSA PCR Screening     Status: None   Collection Time: 07/11/16  6:56 PM  Result Value Ref Range Status   MRSA by PCR NEGATIVE NEGATIVE Final    Comment:        The GeneXpert MRSA Assay (FDA approved for NASAL specimens only), is one component of a comprehensive MRSA colonization surveillance program. It is not intended to diagnose MRSA infection nor to guide or monitor treatment for MRSA infections.   Urine culture     Status: None   Collection Time: 07/11/16  8:23 PM  Result Value Ref Range Status   Specimen Description URINE, CATHETERIZED  Final   Special Requests NONE  Final   Culture NO GROWTH  Final   Report Status 07/13/2016 FINAL  Final    Radiology Reports Portable Chest X-ray (1 View)  Result Date: 07/11/2016 CLINICAL DATA:  Central line placement EXAM: PORTABLE CHEST 1 VIEW COMPARISON:  07/11/2016 FINDINGS: Multiple support leads obscure the chest. Insertion of left-sided central venous catheter, the tip is faintly visualized over the proximal right atrium. No  pneumothorax. Low lung volumes without acute infiltrate. Mild subsegmental atelectasis at the left CP angle. Stable cardiomediastinal silhouette. Surgical clips in the right upper quadrant. IMPRESSION: Left-sided central venous catheter tip overlies the proximal right atrium. No pneumothorax. Electronically Signed   By: Donavan Foil M.D.   On: 07/11/2016 16:45   Dg Chest Port 1 View  Result Date: 07/11/2016 CLINICAL DATA:  Unresponsive, nausea and vomiting EXAM: PORTABLE CHEST 1 VIEW COMPARISON:  06/05/2016 FINDINGS: Borderline cardiomegaly. Elevation of the right hemidiaphragm again noted. No infiltrate or pulmonary edema. Central mild vascular congestion. IMPRESSION: Cardiomegaly. Central mild vascular congestion without convincing pulmonary edema. No segmental infiltrate. Electronically Signed   By: Lahoma Crocker M.D.   On: 07/11/2016 15:34     CBC  Recent Labs Lab 07/14/16 0350 07/15/16 0500 07/16/16 0632 07/18/16 0430 07/19/16 0447  WBC 11.6* 7.6 8.7 9.5 11.1*  HGB 7.6* 7.1* 7.8* 7.2* 7.1*  HCT 21.5* 20.4* 22.7* 20.6* 20.8*  PLT 78* 83* 125* 173 186  MCV 91.9 93.6 94.6 94.5 95.4  MCH 32.5 32.6 32.5 33.0 32.6  MCHC 35.3 34.8 34.4 35.0 34.1  RDW 13.7 14.4 14.3 14.0 15.1    Chemistries   Recent Labs Lab 07/14/16 0350  07/14/16 2036 07/15/16 0500 07/16/16 0632 07/17/16 1004 07/18/16 0430 07/19/16 0447  NA 148*  --   --  148* 142 141 138 138  K 2.5*  < >  --  3.0* 3.0* 2.9* 2.6* 3.3*  CL 113*  --   --  118* 113* 112* 109 113*  CO2 26  --   --  26 22 20* 22 20*  GLUCOSE 202*  --   --  105* 128* 103* 84 128*  BUN 25*  --   --  16 14 11 8  <5*  CREATININE 0.88  --   --  0.62 0.68 0.72 0.64 0.62  CALCIUM 7.3*  --   --  6.7* 7.0* 7.3* 7.0* 7.2*  MG 1.5*  --  2.4  --   --   --   --  1.3*  < > = values in this interval not displayed. ------------------------------------------------------------------------------------------------------------------ No results for input(s): CHOL,  HDL, LDLCALC, TRIG, CHOLHDL, LDLDIRECT in the last 72 hours.  Lab Results  Component Value Date   HGBA1C 8.8 (H) 07/11/2016   ------------------------------------------------------------------------------------------------------------------ No results for input(s): TSH, T4TOTAL, T3FREE, THYROIDAB in the last 72 hours.  Invalid input(s): FREET3 ------------------------------------------------------------------------------------------------------------------ No results for input(s): VITAMINB12, FOLATE, FERRITIN, TIBC, IRON, RETICCTPCT in the last 72 hours.  Coagulation profile No results for input(s): INR, PROTIME in the last 168 hours.  No results for input(s): DDIMER in the last 72 hours.  Cardiac Enzymes  Recent Labs Lab 07/12/16 1837 07/13/16 0025  TROPONINI 0.82* 0.65*   ------------------------------------------------------------------------------------------------------------------ No results found for: BNP   Kinze Labo M.D on 07/19/2016 at 6:32 PM  Between 7am to 7pm - Pager - [phone]  After 7pm go to [email or web address] - password [email or web address]  [facility] -  Office  [phone]  Dragon dictation system was used to create this note, attempts have been made to correct errors, however presence of uncorrected errors is not a reflection quality of care provided

## 2016-07-19 NOTE — Progress Notes (Signed)
Unable to start blood due to loss of IV access.  IV team paged, pt difficult IV stick.

## 2016-07-20 LAB — TYPE AND SCREEN
ABO/RH(D): O POS
Antibody Screen: NEGATIVE
Unit division: 0

## 2016-07-20 LAB — COMPREHENSIVE METABOLIC PANEL
ALBUMIN: 1.6 g/dL — AB (ref 3.5–5.0)
ALK PHOS: 84 U/L (ref 38–126)
ALT: 23 U/L (ref 14–54)
AST: 31 U/L (ref 15–41)
Anion gap: 8 (ref 5–15)
CALCIUM: 7.4 mg/dL — AB (ref 8.9–10.3)
CO2: 22 mmol/L (ref 22–32)
CREATININE: 0.68 mg/dL (ref 0.44–1.00)
Chloride: 109 mmol/L (ref 101–111)
GFR calc Af Amer: >60 mL/min (ref >60)
GFR calc non Af Amer: >60 mL/min (ref >60)
Glucose, Bld: 60 mg/dL — ABNORMAL LOW (ref 65–99)
Potassium: 2.5 mmol/L — CL (ref 3.5–5.1)
SODIUM: 139 mmol/L (ref 135–145)
Total Bilirubin: 0.5 mg/dL (ref 0.3–1.2)
Total Protein: 4.7 g/dL — ABNORMAL LOW (ref 6.5–8.1)

## 2016-07-20 LAB — CBC WITH DIFFERENTIAL/PLATELET
BASOS PCT: 0 %
Basophils Absolute: 0 10*3/uL (ref 0.0–0.1)
EOS ABS: 0.2 10*3/uL (ref 0.0–0.7)
Eosinophils Relative: 1 %
HEMATOCRIT: 27.3 % — AB (ref 36.0–46.0)
HEMOGLOBIN: 9.4 g/dL — AB (ref 12.0–15.0)
Lymphocytes Relative: 18 %
Lymphs Abs: 2.3 10*3/uL (ref 0.7–4.0)
MCH: 31.6 pg (ref 26.0–34.0)
MCHC: 34.4 g/dL (ref 30.0–36.0)
MCV: 91.9 fL (ref 78.0–100.0)
Monocytes Absolute: 1.4 10*3/uL — ABNORMAL HIGH (ref 0.1–1.0)
Monocytes Relative: 12 %
NEUTROS ABS: 8.6 10*3/uL — AB (ref 1.7–7.7)
NEUTROS PCT: 69 %
Platelets: 223 10*3/uL (ref 150–400)
RBC: 2.97 MIL/uL — AB (ref 3.87–5.11)
RDW: 15.3 % (ref 11.5–15.5)
WBC: 12.5 10*3/uL — AB (ref 4.0–10.5)

## 2016-07-20 LAB — BASIC METABOLIC PANEL
ANION GAP: 8 (ref 5–15)
BUN: <5 mg/dL — ABNORMAL LOW (ref 6–20)
CO2: 19 mmol/L — ABNORMAL LOW (ref 22–32)
Calcium: 7.1 mg/dL — ABNORMAL LOW (ref 8.9–10.3)
Chloride: 107 mmol/L (ref 101–111)
Creatinine, Ser: 0.76 mg/dL (ref 0.44–1.00)
GFR calc Af Amer: >60 mL/min (ref >60)
GLUCOSE: 336 mg/dL — AB (ref 65–99)
POTASSIUM: 3.5 mmol/L (ref 3.5–5.1)
Sodium: 134 mmol/L — ABNORMAL LOW (ref 135–145)

## 2016-07-20 LAB — GLUCOSE, CAPILLARY
GLUCOSE-CAPILLARY: 71 mg/dL (ref 65–99)
GLUCOSE-CAPILLARY: 142 mg/dL — AB (ref 65–99)
GLUCOSE-CAPILLARY: 200 mg/dL — AB (ref 65–99)
Glucose-Capillary: 293 mg/dL — ABNORMAL HIGH (ref 65–99)

## 2016-07-20 LAB — MAGNESIUM: Magnesium: 1.3 mg/dL — ABNORMAL LOW (ref 1.7–2.4)

## 2016-07-20 MED ORDER — POTASSIUM CHLORIDE CRYS ER 20 MEQ PO TBCR
40.0000 meq | EXTENDED_RELEASE_TABLET | Freq: Once | ORAL | Status: DC
  Filled 2016-07-20 (×2): qty 2

## 2016-07-20 MED ORDER — POTASSIUM CHLORIDE 20 MEQ PO PACK
40.0000 meq | PACK | Freq: Once | ORAL | Status: AC
  Administered 2016-07-20: 40 meq via ORAL
  Filled 2016-07-20: qty 2

## 2016-07-20 MED ORDER — POTASSIUM CHLORIDE 20 MEQ PO PACK
40.0000 meq | PACK | Freq: Two times a day (BID) | ORAL | Status: DC
  Administered 2016-07-20 – 2016-07-22 (×4): 40 meq via ORAL
  Filled 2016-07-20 (×4): qty 2

## 2016-07-20 MED ORDER — POTASSIUM CHLORIDE CRYS ER 20 MEQ PO TBCR
40.0000 meq | EXTENDED_RELEASE_TABLET | Freq: Once | ORAL | Status: DC
  Filled 2016-07-20: qty 2

## 2016-07-20 MED ORDER — MAGNESIUM SULFATE 4 GM/100ML IV SOLN
4.0000 g | Freq: Once | INTRAVENOUS | Status: AC
  Administered 2016-07-20: 4 g via INTRAVENOUS
  Filled 2016-07-20: qty 100

## 2016-07-20 MED ORDER — POTASSIUM CHLORIDE CRYS ER 20 MEQ PO TBCR
40.0000 meq | EXTENDED_RELEASE_TABLET | Freq: Three times a day (TID) | ORAL | Status: DC
  Administered 2016-07-20: 40 meq via ORAL
  Filled 2016-07-20: qty 2

## 2016-07-20 MED ORDER — POTASSIUM PHOSPHATE MONOBASIC 500 MG PO TABS
500.0000 mg | ORAL_TABLET | Freq: Three times a day (TID) | ORAL | Status: DC
  Administered 2016-07-20: 500 mg via ORAL
  Filled 2016-07-20 (×3): qty 1

## 2016-07-20 NOTE — Progress Notes (Signed)
CRITICAL VALUE ALERT  Critical value received:  Potassium 2.5  Date of notification:  07/20/16  Time of notification:  0650  Critical value read back:Yes.    Nurse who received alert:  Lorenso Courier  MD notified (1st page):  Olevia Bowens, MD  Time of first page:  [phone]  MD notified (2nd page):  Time of second page:  Responding MD:  Olevia Bowens, MD  Time MD responded:  [phone]  New orders placed

## 2016-07-20 NOTE — Progress Notes (Signed)
Patient Demographics:    Katie Lozano, is a 76 y.o. female, DOB - 19-Feb-1941, [id number]  Admit date - 07/11/2016   Admitting Physician Juanito Doom, MD  Outpatient Primary MD for the patient is Hoyt Koch, MD  LOS - 9   Chief Complaint  Patient presents with  . Altered Mental Status        Subjective:    Katie Lozano today has no fevers, no emesis,  No chest pain,  Loose stools persist   Assessment  & Plan :    Active Problems:   Diabetic ketoacidosis without coma associated with type 2 diabetes mellitus ([place])   AKI (acute kidney injury) ([place])   UGIB (upper gastrointestinal bleed)   Melena   Acute blood loss anemia   Encounter for central line placement   Metabolic acidosis   Pressure injury of skin  Interval summary:- 76 y/o female who presented with DKA, shock, AKI, NSTEMI, who developed an upper GI bleed with melena. 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 She also had erosive gastritis. H/o  DM2 and recent TIA who was admitted on 1/13 with acute encephalopathy from DKA after a viral gastroenteritis. This has been associated with shock, NSTEMI and lactic acidosis.    Plan:- 1)Acute Esophageal Necrosis - No hematemesis , diet advanced, tolerating solid diet well EGD on 07/14/2016 with findings of acute esophageal necrosis,  Gastroenterologist suggest that Pt's acute esophageal necrosis was the result of DKA / shock, due to ischemic injury. As per gastroenterologist Depending on the underlying etiology, risk of mortality associated with this is upwards of 30%, with increased risk of esophageal perforation. Fortunately the etiology for her case is reversible and she's doing much better clinically, and hopefully this heals up well. Long term, there is high rate of esophageal  stricture formation which she will need to be evaluated for as outpatient. Given the rarity of AEN, there are no consensus management guidelines, although most sources recommend NPO for at least 24 hours, avoidance of NG tube if possible, and Protonix twice a day and Carafate 1 g every 6 hours (causing loose stools). Repeat EGD as outpatient to assess for healing and stricture formation  2)Hypoxia-  improved, c/n  incentive spirometry, use supplemental oxygen,  prn bronchodilators  2)Possible NSTEMI-  stable, no chest pains, aspirin and anticoagulation areOn holddue to concerns about GI bleed  3)Hypokalemia- due to diarrhea (Carafate is probably contributing to diarrhea), potassium down to 2.5, check magnesium,  replace and recheck  4)H/o Severe diverticulosis, s/p removal 3 tubular adenomas, sm int hemorrhoids (per colo in March 2017)-   5)Anemia/thrombocytopenia- +FOBT, drop in Hgb (9.4>7.8)-  transfuse 1 unit of packed red blood cells as hematocrit is below 21, patient has some tachycardia earlier and dyspnea on exertion (most likely related to the anemia) 6)DM- resolved DKA, continue Lantus and Use Novolog/Humalog Sliding scale insulin with Accu-Cheks/Fingersticks as ordered  7)Generalized weakness/debility- continue physical therapy  Code Status:full   Disposition Plan: SNF  Consults :Gi  DVT Prophylaxis: SCDs   Lab Results  Component Value Date   PLT 223 07/20/2016    Inpatient Medications  Scheduled Meds: . sodium chloride   Intravenous  Once  . insulin aspart  0-9 Units Subcutaneous TID WC  . insulin glargine  20 Units Subcutaneous Daily  . magnesium sulfate 1 - 4 g bolus IVPB  4 g Intravenous Once  . mouth rinse  15 mL Mouth Rinse BID  . pantoprazole (PROTONIX) IV  40 mg Intravenous Q12H  . potassium chloride  40 mEq Oral TID  . potassium chloride  40 mEq Oral Once  . potassium chloride  40 mEq Oral Once  . potassium phosphate (monobasic)   500 mg Oral TID WC  . sucralfate  1 g Oral Q6H   Continuous Infusions: . sodium chloride 100 mL/hr at 07/20/16 0159   PRN Meds:.    Anti-infectives    Start     Dose/Rate Route Frequency Ordered Stop   07/13/16 1600  vancomycin (VANCOCIN) IVPB 1000 mg/200 mL premix  Status:  Discontinued     1,000 mg 200 mL/hr over 60 Minutes Intravenous Every 48 hours 07/11/16 2224 07/12/16 1331   07/13/16 1300  piperacillin-tazobactam (ZOSYN) IVPB 3.375 g  Status:  Discontinued     3.375 g 12.5 mL/hr over 240 Minutes Intravenous Every 8 hours 07/13/16 0955 07/14/16 1108   07/12/16 1700  vancomycin (VANCOCIN) IVPB 750 mg/150 ml premix  Status:  Discontinued     750 mg 150 mL/hr over 60 Minutes Intravenous Every 24 hours 07/11/16 1621 07/11/16 2224   07/11/16 2300  piperacillin-tazobactam (ZOSYN) IVPB 3.375 g  Status:  Discontinued     3.375 g 12.5 mL/hr over 240 Minutes Intravenous Every 8 hours 07/11/16 1621 07/11/16 2224   07/11/16 2230  piperacillin-tazobactam (ZOSYN) IVPB 2.25 g  Status:  Discontinued     2.25 g 100 mL/hr over 30 Minutes Intravenous Every 8 hours 07/11/16 2224 07/13/16 0955   07/11/16 1530  vancomycin (VANCOCIN) 1,250 mg in sodium chloride 0.9 % 250 mL IVPB     1,250 mg 166.7 mL/hr over 90 Minutes Intravenous  Once 07/11/16 1501 07/12/16 0700   07/11/16 1500  piperacillin-tazobactam (ZOSYN) IVPB 3.375 g     3.375 g 100 mL/hr over 30 Minutes Intravenous  Once 07/11/16 1451 07/11/16 1706   07/11/16 1500  vancomycin (VANCOCIN) IVPB 1000 mg/200 mL premix  Status:  Discontinued     1,000 mg 200 mL/hr over 60 Minutes Intravenous  Once 07/11/16 1451 07/11/16 1501        Objective:   Vitals:   07/19/16 2248 07/19/16 2305 07/20/16 0140 07/20/16 0535  BP: (!) 129/43 (!) 129/44 (!) 133/47 (!) 136/48  Pulse: 86 85 86 87  Resp: 20 20 20 19   Temp: 98.2 F (36.8 C) 98.4 F (36.9 C) 99.3 F (37.4 C) 98.1 F (36.7 C)  TempSrc: Oral Oral Oral Oral  SpO2: 100% 99% 95% 95%    Weight:      Height:        Wt Readings from Last 3 Encounters:  07/15/16 72.3 kg (159 lb 8 oz)  06/06/16 68.7 kg (151 lb 7.3 oz)  09/04/15 67.6 kg (149 lb)     Intake/Output Summary (Last 24 hours) at 07/20/16 0848 Last data filed at 07/20/16 0300  Gross per 24 hour  Intake             4020 ml  Output              800 ml  Net             3220 ml     Physical Exam  Gen:- Awake  Alert,  In no apparent distress  HEENT:- [place].AT, No sclera icterus Neck-Supple Neck,No JVD,.  Lungs-  CTAB  CV- S1, S2 normal Abd-  +ve B.Sounds, Abd Soft, No tenderness,    Extremity/Skin:- Warm and dry     Data Review:   Micro Results Recent Results (from the past 240 hour(s))  Blood Culture (routine x 2)     Status: None   Collection Time: 07/11/16  4:50 PM  Result Value Ref Range Status   Specimen Description BLOOD LEFT EJ  Final   Special Requests BOTTLES DRAWN AEROBIC AND ANAEROBIC 5CC  Final   Culture NO GROWTH 5 DAYS  Final   Report Status 07/16/2016 FINAL  Final  Blood Culture (routine x 2)     Status: Abnormal   Collection Time: 07/11/16  5:09 PM  Result Value Ref Range Status   Specimen Description BLOOD RIGHT HAND  Final   Special Requests AEROCOCCUS SPECIES 6CC (A)  Final   Culture NO GROWTH 5 DAYS  Final   Report Status 07/16/2016 FINAL  Final  MRSA PCR Screening     Status: None   Collection Time: 07/11/16  6:56 PM  Result Value Ref Range Status   MRSA by PCR NEGATIVE NEGATIVE Final    Comment:        The GeneXpert MRSA Assay (FDA approved for NASAL specimens only), is one component of a comprehensive MRSA colonization surveillance program. It is not intended to diagnose MRSA infection nor to guide or monitor treatment for MRSA infections.   Urine culture     Status: None   Collection Time: 07/11/16  8:23 PM  Result Value Ref Range Status   Specimen Description URINE, CATHETERIZED  Final   Special Requests NONE  Final   Culture NO GROWTH  Final   Report  Status 07/13/2016 FINAL  Final    Radiology Reports Portable Chest X-ray (1 View)  Result Date: 07/11/2016 CLINICAL DATA:  Central line placement EXAM: PORTABLE CHEST 1 VIEW COMPARISON:  07/11/2016 FINDINGS: Multiple support leads obscure the chest. Insertion of left-sided central venous catheter, the tip is faintly visualized over the proximal right atrium. No pneumothorax. Low lung volumes without acute infiltrate. Mild subsegmental atelectasis at the left CP angle. Stable cardiomediastinal silhouette. Surgical clips in the right upper quadrant. IMPRESSION: Left-sided central venous catheter tip overlies the proximal right atrium. No pneumothorax. Electronically Signed   By: Donavan Foil M.D.   On: 07/11/2016 16:45   Dg Chest Port 1 View  Result Date: 07/11/2016 CLINICAL DATA:  Unresponsive, nausea and vomiting EXAM: PORTABLE CHEST 1 VIEW COMPARISON:  06/05/2016 FINDINGS: Borderline cardiomegaly. Elevation of the right hemidiaphragm again noted. No infiltrate or pulmonary edema. Central mild vascular congestion. IMPRESSION: Cardiomegaly. Central mild vascular congestion without convincing pulmonary edema. No segmental infiltrate. Electronically Signed   By: Lahoma Crocker M.D.   On: 07/11/2016 15:34     CBC  Recent Labs Lab 07/15/16 0500 07/16/16 0632 07/18/16 0430 07/19/16 0447 07/20/16 0550  WBC 7.6 8.7 9.5 11.1* 12.5*  HGB 7.1* 7.8* 7.2* 7.1* 9.4*  HCT 20.4* 22.7* 20.6* 20.8* 27.3*  PLT 83* 125* 173 186 223  MCV 93.6 94.6 94.5 95.4 91.9  MCH 32.6 32.5 33.0 32.6 31.6  MCHC 34.8 34.4 35.0 34.1 34.4  RDW 14.4 14.3 14.0 15.1 15.3  LYMPHSABS  --   --   --   --  2.3  MONOABS  --   --   --   --  1.4*  EOSABS  --   --   --   --  0.2  BASOSABS  --   --   --   --  0.0    Chemistries   Recent Labs Lab 07/14/16 0350  07/14/16 2036  07/16/16 0632 07/17/16 1004 07/18/16 0430 07/19/16 0447 07/20/16 0550  NA 148*  --   --   < > 142 141 138 138 139  K 2.5*  < >  --   < > 3.0* 2.9*  2.6* 3.3* 2.5*  CL 113*  --   --   < > 113* 112* 109 113* 109  CO2 26  --   --   < > 22 20* 22 20* 22  GLUCOSE 202*  --   --   < > 128* 103* 84 128* 60*  BUN 25*  --   --   < > 14 11 8  <5* <5*  CREATININE 0.88  --   --   < > 0.68 0.72 0.64 0.62 0.68  CALCIUM 7.3*  --   --   < > 7.0* 7.3* 7.0* 7.2* 7.4*  MG 1.5*  --  2.4  --   --   --   --  1.3* 1.3*  AST  --   --   --   --   --   --   --   --  31  ALT  --   --   --   --   --   --   --   --  23  ALKPHOS  --   --   --   --   --   --   --   --  84  BILITOT  --   --   --   --   --   --   --   --  0.5  < > = values in this interval not displayed. ------------------------------------------------------------------------------------------------------------------ No results for input(s): CHOL, HDL, LDLCALC, TRIG, CHOLHDL, LDLDIRECT in the last 72 hours.  Lab Results  Component Value Date   HGBA1C 8.8 (H) 07/11/2016   ------------------------------------------------------------------------------------------------------------------ No results for input(s): TSH, T4TOTAL, T3FREE, THYROIDAB in the last 72 hours.  Invalid input(s): FREET3 ------------------------------------------------------------------------------------------------------------------ No results for input(s): VITAMINB12, FOLATE, FERRITIN, TIBC, IRON, RETICCTPCT in the last 72 hours.  Coagulation profile No results for input(s): INR, PROTIME in the last 168 hours.  No results for input(s): DDIMER in the last 72 hours.  Cardiac Enzymes No results for input(s): CKMB, TROPONINI, MYOGLOBIN in the last 168 hours.  Invalid input(s): CK ------------------------------------------------------------------------------------------------------------------ No results found for: BNP   Shaquasha Gerstel M.D on 07/20/2016 at 8:48 AM  Between 7am to 7pm - Pager - [phone]  After 7pm go to [email or web address] - password [email or web address]  [facility] -  Office  [phone]  Dragon dictation  system was used to create this note, attempts have been made to correct errors, however presence of uncorrected errors is not a reflection quality of care provided

## 2016-07-21 LAB — GLUCOSE, CAPILLARY
GLUCOSE-CAPILLARY: 188 mg/dL — AB (ref 65–99)
Glucose-Capillary: 94 mg/dL (ref 65–99)
Glucose-Capillary: 165 mg/dL — ABNORMAL HIGH (ref 65–99)
Glucose-Capillary: 279 mg/dL — ABNORMAL HIGH (ref 65–99)

## 2016-07-21 LAB — BASIC METABOLIC PANEL
Anion gap: 6 (ref 5–15)
BUN: <5 mg/dL — ABNORMAL LOW (ref 6–20)
CHLORIDE: 111 mmol/L (ref 101–111)
CO2: 23 mmol/L (ref 22–32)
Calcium: 7.3 mg/dL — ABNORMAL LOW (ref 8.9–10.3)
Creatinine, Ser: 0.68 mg/dL (ref 0.44–1.00)
GFR calc non Af Amer: >60 mL/min (ref >60)
Glucose, Bld: 74 mg/dL (ref 65–99)
Potassium: 2.7 mmol/L — CL (ref 3.5–5.1)
Sodium: 140 mmol/L (ref 135–145)

## 2016-07-21 LAB — COMPREHENSIVE METABOLIC PANEL
ALBUMIN: 1.8 g/dL — AB (ref 3.5–5.0)
ALT: 23 U/L (ref 14–54)
AST: 40 U/L (ref 15–41)
Alkaline Phosphatase: 92 U/L (ref 38–126)
Anion gap: 9 (ref 5–15)
CHLORIDE: 110 mmol/L (ref 101–111)
CO2: 20 mmol/L — AB (ref 22–32)
CREATININE: 0.84 mg/dL (ref 0.44–1.00)
Calcium: 7.6 mg/dL — ABNORMAL LOW (ref 8.9–10.3)
GFR calc Af Amer: >60 mL/min (ref >60)
GFR calc non Af Amer: >60 mL/min (ref >60)
Glucose, Bld: 221 mg/dL — ABNORMAL HIGH (ref 65–99)
Potassium: 3.5 mmol/L (ref 3.5–5.1)
SODIUM: 139 mmol/L (ref 135–145)
Total Bilirubin: 0.3 mg/dL (ref 0.3–1.2)
Total Protein: 5.1 g/dL — ABNORMAL LOW (ref 6.5–8.1)

## 2016-07-21 LAB — CBC
HCT: 26.1 % — ABNORMAL LOW (ref 36.0–46.0)
HEMOGLOBIN: 8.7 g/dL — AB (ref 12.0–15.0)
MCH: 31.3 pg (ref 26.0–34.0)
MCHC: 33.3 g/dL (ref 30.0–36.0)
MCV: 93.9 fL (ref 78.0–100.0)
PLATELETS: 241 10*3/uL (ref 150–400)
RBC: 2.78 MIL/uL — AB (ref 3.87–5.11)
RDW: 16.1 % — ABNORMAL HIGH (ref 11.5–15.5)
WBC: 10.2 10*3/uL (ref 4.0–10.5)

## 2016-07-21 LAB — MAGNESIUM: Magnesium: 1.8 mg/dL (ref 1.7–2.4)

## 2016-07-21 MED ORDER — POTASSIUM CHLORIDE 20 MEQ PO PACK
40.0000 meq | PACK | Freq: Once | ORAL | Status: AC
  Filled 2016-07-21: qty 2

## 2016-07-21 NOTE — Progress Notes (Signed)
Pt has a critical value potassium of 2.7. Potassium solution given to pt, MD notified.

## 2016-07-21 NOTE — Progress Notes (Signed)
Physical Therapy Treatment Patient Details Name: Katie Lozano MRN: [id number] DOB: Apr 18, 1941 Today's Date: 07/21/2016    History of Present Illness 76 yo female upper GI bleed in setting of DKA, AKI, and NSTEMI / shock. EGD on 1/15 revealed non-bleeding erosive gastritis and severe esophagitis with possible acute esophageal necrosis.    PT Comments    Pt agreed to participate with gait training today. She seemed alert and was pleasant to work with.   O2 sats monitored throughout treatment session.  Resting in bed on RA = 80-85% On 3L of 02 during ambulation 88-98%  Pt would benefit from continued gait training while monitoring vitals to increase functional independence to prepare for SNF at d/c.    Follow Up Recommendations  SNF     Equipment Recommendations   (TBA)    Recommendations for Other Services       Precautions / Restrictions Precautions Precautions: Fall Restrictions Weight Bearing Restrictions: No    Mobility  Bed Mobility Overal bed mobility: Needs Assistance Bed Mobility: Supine to Sit;Sit to Supine     Supine to sit: HOB elevated;Supervision Sit to supine: HOB elevated;Min assist   General bed mobility comments: Pt able to get out of bed by herself but did require cuing to scoot get to EOB; pt needed min A for boosting in bed for adequate positioning.   Transfers Overall transfer level: Needs assistance Equipment used: Rolling walker (2 wheeled) Transfers: Sit to/from Stand Sit to Stand: Min guard;Min assist         General transfer comment: min guard for safety due to pt feeling unsteady in standing and urgency to use bed side commode. Pt performed 3 sit to stand trials, one from EOB with min guard for safety and 2 from elevated surface, with min A to initiate movement.  Ambulation/Gait Ambulation/Gait assistance: Min guard Ambulation Distance (Feet): 60 Feet (+ 100 feet) Assistive device: Rolling walker (2 wheeled) Gait  Pattern/deviations: Step-through pattern;Decreased stride length;Trunk flexed;Narrow base of support Gait velocity: decreased   General Gait Details: unable to obtain proper 02 reading during ambulation; took one sitting rest break; no signs of SOB. Educated pt on pursed lip breathing.   Stairs            Wheelchair Mobility    Modified Rankin (Stroke Patients Only)       Balance Overall balance assessment: Needs assistance Sitting-balance support: Feet unsupported;Bilateral upper extremity supported Sitting balance-Leahy Scale: Fair Sitting balance - Comments: able to sit on EOB with B UE support   Standing balance support: Single extremity supported;During functional activity Standing balance-Leahy Scale: Fair Standing balance comment: requires HHA or RW during standing balance. Pt experienced LOB while leaning over as PTA performed perianal care with one UE HHA but SPTA able to regain balance                    Cognition Arousal/Alertness: Awake/alert Behavior During Therapy: WFL for tasks assessed/performed Overall Cognitive Status: Within Functional Limits for tasks assessed                 General Comments: pt appeared to be pleasant and accepting of therapy today    Exercises      General Comments        Pertinent Vitals/Pain Pain Assessment: No/denies pain    Home Living                      Prior Function  PT Goals (current goals can now be found in the care plan section) Acute Rehab PT Goals Patient Stated Goal: take a nap PT Goal Formulation: With patient Potential to Achieve Goals: Good Progress towards PT goals: Progressing toward goals    Frequency    Min 3X/week      PT Plan Current plan remains appropriate    Co-evaluation             End of Session Equipment Utilized During Treatment: Gait belt Activity Tolerance: Patient tolerated treatment well;No increased pain Patient left: in  bed;with bed alarm set;with call bell/phone within reach     Time: [id number] PT Time Calculation (min) (ACUTE ONLY): 44 min  Charges:  $Gait Training: 8-22 mins $Therapeutic Activity: 23-37 mins                    G Codes:      [facility] 08/06/16, 4:52 PM [place], [place] Pager [phone]

## 2016-07-21 NOTE — Progress Notes (Signed)
CCMD called to notify about prolonged QTc, and change in rhythm from NSR to BBB. Pt denied chest pain, SOB, VS stable, Schorr, NP notified.

## 2016-07-21 NOTE — Progress Notes (Addendum)
Inpatient Diabetes Program Recommendations  AACE/ADA: New Consensus Statement on Inpatient Glycemic Control (2015)  Target Ranges:  Prepandial:   less than 140 mg/dL      Peak postprandial:   less than 180 mg/dL (1-2 hours)      Critically ill patients:  140 - 180 mg/dL   Lab Results  Component Value Date   GLUCAP 94 07/21/2016   HGBA1C 8.8 (H) 07/11/2016   Results for Katie Lozano, Katie Lozano (MRN [id number]) as of 07/21/2016 11:04  Ref. Range 07/20/2016 07:45 07/20/2016 12:57 07/20/2016 17:31 07/20/2016 20:50 07/21/2016 07:45  Glucose-Capillary Latest Ref Range: 65 - 99 mg/dL 71 142 (H) 293 (H) 200 (H) 94    Current orders for Inpatient glycemic control:    Novolog 0-9 units TIDAC correction SSI    Lantus 20 units daily  Inpatient Diabetes Program Recommendations:    Noted continuing low CBG's in AMs.  Please consider decreasing Lantus to 17 units daily starting today.    Noted increased CBG's after meals.  Please consider adding meal coverage of 3 units TIDAC if eats > 50% of meal.  Text page to MD sent.  Thank you,  Windy Carina, RN, MSN Diabetes Coordinator Inpatient Diabetes Program [phone] (Team Pager)

## 2016-07-21 NOTE — Care Management Important Message (Signed)
Important Message  Patient Details  Name: Katie Lozano MRN: [id number] Date of Birth: May 02, 1941   Medicare Important Message Given:  Yes    Nathen May 07/21/2016, 1:57 PM

## 2016-07-21 NOTE — Progress Notes (Signed)
Patient Demographics:    Katie Lozano, is a 76 y.o. female, DOB - 09-21-1940, [id number]  Admit date - 07/11/2016   Admitting Physician Juanito Doom, MD  Outpatient Primary MD for the patient is Hoyt Koch, MD  LOS - 10  Chief Complaint  Patient presents with  . Altered Mental Status        Subjective:    Katie Lozano today has no fevers, no emesis,  No chest pain,  Diarrhea is better   Assessment  & Plan :    Active Problems:   Diabetic ketoacidosis without coma associated with type 2 diabetes mellitus ([place])   AKI (acute kidney injury) ([place])   UGIB (upper gastrointestinal bleed)   Melena   Acute blood loss anemia   Encounter for central line placement   Metabolic acidosis   Pressure injury of skin  Interval summary:- 76 y/o female who presented with DKA, shock, AKI, NSTEMI, who developed an upper GI bleed with melena. 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 She also had erosive gastritis. H/o DM2 and recent TIA who was admitted on 1/13 with acute encephalopathy from DKA after a viral gastroenteritis. This has been associated with shock, NSTEMI and lactic acidosis.    Plan:- 1)Acute Esophageal Necrosis - No hematemesis , tolerating solid diet well,  EGD on 07/14/2016 with findings of acute esophageal necrosis, Gastroenterologist suggest that Pt's acute esophageal necrosis was the result of DKA / shock, due to ischemic injury. As per gastroenterologist Depending on the underlying etiology, risk of mortality associated with this is upwards of 30%, with increased risk of esophageal perforation. Fortunately the etiology for her case is reversible and she's doing much better clinically, and hopefully this heals up well. Long term, there is high rate of esophageal stricture  formation which she will need to be evaluated for as outpatient. Given the rarity of AEN, there are no consensus management guidelines, although most sources recommend NPO for at least 24 hours, avoidance of NG tube if possible, and Protonix twice a day and Carafate1 g every 6 hours (causing loose stools). Repeat EGD as outpatient to assess for healing and stricture formation  2)Hypoxia-  improved, c/n  incentive spirometry, use supplemental oxygen, prn bronchodilators  2)Possible NSTEMI-  stable, no chest pains, aspirin and anticoagulation areOn holddue to concerns about GI bleed  3)Hypokalemia/hypomagnesemia- due to diarrhea (Carafate is probably contributing to diarrhea), replace and recheck, if discharged over the next day or 2 consider discharging patient on scheduled doses of potassium at least for a few days  4)H/o Severe diverticulosis, s/p removal 3 tubular adenomas, sm int hemorrhoids (per colo in March 2017)- no evidence of gross diverticular bleed/hemorrhage at this time, no abdominal pain or fevers or any other indication of acute diverticulitis at this time  5)Anemia/thrombocytopenia- +FOBT, drop in Hgb (9.4>7.8)- okay to transfuse if hematocrit is below 21, last transfusion PRBCs (1 unit) was on  07/19/2016, hemoglobin today 8.7 patient has some tachycardia earlier and dyspnea on exertion (most likely related to the anemia)  6)DM- resolved DKA, continue Lantus 20 units and Use Novolog/Humalog Sliding scale insulin with Accu-Cheks/Fingersticks as ordered  7)Generalized weakness/debility- continue physical therapy, awaiting  discharge to skilled nursing facility for rehabilitation when bed becomes available pending insurance approval  Code Status:full  Disposition Plan:SNF... , awaiting discharge to skilled nursing facility for rehabilitation when bed becomes available pending insurance approval  Consults :Gi  DVT Prophylaxis: SCDs  Lab Results    Component Value Date   PLT 241 07/21/2016    Inpatient Medications  Scheduled Meds: . sodium chloride   Intravenous Once  . insulin aspart  0-9 Units Subcutaneous TID WC  . insulin glargine  20 Units Subcutaneous Daily  . mouth rinse  15 mL Mouth Rinse BID  . pantoprazole (PROTONIX) IV  40 mg Intravenous Q12H  . potassium chloride  40 mEq Oral BID  . sucralfate  1 g Oral Q6H   Continuous Infusions: . sodium chloride 100 mL/hr at 07/21/16 0146   PRN Meds:.    Anti-infectives    Start     Dose/Rate Route Frequency Ordered Stop   07/13/16 1600  vancomycin (VANCOCIN) IVPB 1000 mg/200 mL premix  Status:  Discontinued     1,000 mg 200 mL/hr over 60 Minutes Intravenous Every 48 hours 07/11/16 2224 07/12/16 1331   07/13/16 1300  piperacillin-tazobactam (ZOSYN) IVPB 3.375 g  Status:  Discontinued     3.375 g 12.5 mL/hr over 240 Minutes Intravenous Every 8 hours 07/13/16 0955 07/14/16 1108   07/12/16 1700  vancomycin (VANCOCIN) IVPB 750 mg/150 ml premix  Status:  Discontinued     750 mg 150 mL/hr over 60 Minutes Intravenous Every 24 hours 07/11/16 1621 07/11/16 2224   07/11/16 2300  piperacillin-tazobactam (ZOSYN) IVPB 3.375 g  Status:  Discontinued     3.375 g 12.5 mL/hr over 240 Minutes Intravenous Every 8 hours 07/11/16 1621 07/11/16 2224   07/11/16 2230  piperacillin-tazobactam (ZOSYN) IVPB 2.25 g  Status:  Discontinued     2.25 g 100 mL/hr over 30 Minutes Intravenous Every 8 hours 07/11/16 2224 07/13/16 0955   07/11/16 1530  vancomycin (VANCOCIN) 1,250 mg in sodium chloride 0.9 % 250 mL IVPB     1,250 mg 166.7 mL/hr over 90 Minutes Intravenous  Once 07/11/16 1501 07/12/16 0700   07/11/16 1500  piperacillin-tazobactam (ZOSYN) IVPB 3.375 g     3.375 g 100 mL/hr over 30 Minutes Intravenous  Once 07/11/16 1451 07/11/16 1706   07/11/16 1500  vancomycin (VANCOCIN) IVPB 1000 mg/200 mL premix  Status:  Discontinued     1,000 mg 200 mL/hr over 60 Minutes Intravenous  Once 07/11/16  1451 07/11/16 1501        Objective:   Vitals:   07/20/16 0140 07/20/16 0535 07/20/16 2048 07/21/16 0451  BP: (!) 133/47 (!) 136/48 (!) 139/57 (!) 126/41  Pulse: 86 87 89 83  Resp: 20 19 20 18   Temp: 99.3 F (37.4 C) 98.1 F (36.7 C) 98.1 F (36.7 C) 97.6 F (36.4 C)  TempSrc: Oral Oral Oral Oral  SpO2: 95% 95% 94% 92%  Weight:      Height:        Wt Readings from Last 3 Encounters:  07/15/16 72.3 kg (159 lb 8 oz)  06/06/16 68.7 kg (151 lb 7.3 oz)  09/04/15 67.6 kg (149 lb)     Intake/Output Summary (Last 24 hours) at 07/21/16 0915 Last data filed at 07/21/16 [id number]  Gross per 24 hour  Intake             2600 ml  Output              250 ml  Net             2350 ml     Physical Exam  Gen:- Awake Alert,  In no apparent distress  HEENT:- [place].AT, No sclera icterus Neck-Supple Neck,No JVD,.  Lungs-  Improved air movement bilaterally CV- S1, S2 normal Abd-  +ve B.Sounds, Abd Soft, No tenderness,    Extremity/Skin:- Intact pulses, skin is warm and dry   Data Review:   Micro Results Recent Results (from the past 240 hour(s))  Blood Culture (routine x 2)     Status: None   Collection Time: 07/11/16  4:50 PM  Result Value Ref Range Status   Specimen Description BLOOD LEFT EJ  Final   Special Requests BOTTLES DRAWN AEROBIC AND ANAEROBIC 5CC  Final   Culture NO GROWTH 5 DAYS  Final   Report Status 07/16/2016 FINAL  Final  Blood Culture (routine x 2)     Status: Abnormal   Collection Time: 07/11/16  5:09 PM  Result Value Ref Range Status   Specimen Description BLOOD RIGHT HAND  Final   Special Requests AEROCOCCUS SPECIES 6CC (A)  Final   Culture NO GROWTH 5 DAYS  Final   Report Status 07/16/2016 FINAL  Final  MRSA PCR Screening     Status: None   Collection Time: 07/11/16  6:56 PM  Result Value Ref Range Status   MRSA by PCR NEGATIVE NEGATIVE Final    Comment:        The GeneXpert MRSA Assay (FDA approved for NASAL specimens only), is one component of  a comprehensive MRSA colonization surveillance program. It is not intended to diagnose MRSA infection nor to guide or monitor treatment for MRSA infections.   Urine culture     Status: None   Collection Time: 07/11/16  8:23 PM  Result Value Ref Range Status   Specimen Description URINE, CATHETERIZED  Final   Special Requests NONE  Final   Culture NO GROWTH  Final   Report Status 07/13/2016 FINAL  Final    Radiology Reports Portable Chest X-ray (1 View)  Result Date: 07/11/2016 CLINICAL DATA:  Central line placement EXAM: PORTABLE CHEST 1 VIEW COMPARISON:  07/11/2016 FINDINGS: Multiple support leads obscure the chest. Insertion of left-sided central venous catheter, the tip is faintly visualized over the proximal right atrium. No pneumothorax. Low lung volumes without acute infiltrate. Mild subsegmental atelectasis at the left CP angle. Stable cardiomediastinal silhouette. Surgical clips in the right upper quadrant. IMPRESSION: Left-sided central venous catheter tip overlies the proximal right atrium. No pneumothorax. Electronically Signed   By: Donavan Foil M.D.   On: 07/11/2016 16:45   Dg Chest Port 1 View  Result Date: 07/11/2016 CLINICAL DATA:  Unresponsive, nausea and vomiting EXAM: PORTABLE CHEST 1 VIEW COMPARISON:  06/05/2016 FINDINGS: Borderline cardiomegaly. Elevation of the right hemidiaphragm again noted. No infiltrate or pulmonary edema. Central mild vascular congestion. IMPRESSION: Cardiomegaly. Central mild vascular congestion without convincing pulmonary edema. No segmental infiltrate. Electronically Signed   By: Lahoma Crocker M.D.   On: 07/11/2016 15:34     CBC  Recent Labs Lab 07/16/16 [id number] 07/18/16 0430 07/19/16 0447 07/20/16 0550 07/21/16 0709  WBC 8.7 9.5 11.1* 12.5* 10.2  HGB 7.8* 7.2* 7.1* 9.4* 8.7*  HCT 22.7* 20.6* 20.8* 27.3* 26.1*  PLT 125* 173 186 223 241  MCV 94.6 94.5 95.4 91.9 93.9  MCH 32.5 33.0 32.6 31.6 31.3  MCHC 34.4 35.0 34.1 34.4 33.3  RDW  14.3 14.0 15.1 15.3 16.1*  LYMPHSABS  --   --   --  2.3  --   MONOABS  --   --   --  1.4*  --   EOSABS  --   --   --  0.2  --   BASOSABS  --   --   --  0.0  --     Chemistries   Recent Labs Lab 07/14/16 2036  07/17/16 1004 07/18/16 0430 07/19/16 0447 07/20/16 0550 07/20/16 1700  NA  --   < > 141 138 138 139 134*  K  --   < > 2.9* 2.6* 3.3* 2.5* 3.5  CL  --   < > 112* 109 113* 109 107  CO2  --   < > 20* 22 20* 22 19*  GLUCOSE  --   < > 103* 84 128* 60* 336*  BUN  --   < > 11 8 <5* <5* <5*  CREATININE  --   < > 0.72 0.64 0.62 0.68 0.76  CALCIUM  --   < > 7.3* 7.0* 7.2* 7.4* 7.1*  MG 2.4  --   --   --  1.3* 1.3*  --   AST  --   --   --   --   --  31  --   ALT  --   --   --   --   --  23  --   ALKPHOS  --   --   --   --   --  84  --   BILITOT  --   --   --   --   --  0.5  --   < > = values in this interval not displayed. ------------------------------------------------------------------------------------------------------------------ No results for input(s): CHOL, HDL, LDLCALC, TRIG, CHOLHDL, LDLDIRECT in the last 72 hours.  Lab Results  Component Value Date   HGBA1C 8.8 (H) 07/11/2016   ------------------------------------------------------------------------------------------------------------------ No results for input(s): TSH, T4TOTAL, T3FREE, THYROIDAB in the last 72 hours.  Invalid input(s): FREET3 ------------------------------------------------------------------------------------------------------------------ No results for input(s): VITAMINB12, FOLATE, FERRITIN, TIBC, IRON, RETICCTPCT in the last 72 hours.  Coagulation profile No results for input(s): INR, PROTIME in the last 168 hours.  No results for input(s): DDIMER in the last 72 hours.  Cardiac Enzymes No results for input(s): CKMB, TROPONINI, MYOGLOBIN in the last 168 hours.  Invalid input(s):  CK ------------------------------------------------------------------------------------------------------------------ No results found for: BNP   Canyon Willow M.D on 07/21/2016 at 9:15 AM  Between 7am to 7pm - Pager - [phone]  After 7pm go to [email or web address] - password [email or web address]  [facility] -  Office  [phone]  Dragon dictation system was used to create this note, attempts have been made to correct errors, however presence of uncorrected errors is not a reflection quality of care provided

## 2016-07-22 ENCOUNTER — Telehealth: Payer: Self-pay

## 2016-07-22 DIAGNOSIS — E876 Hypokalemia: Secondary | ICD-10-CM

## 2016-07-22 DIAGNOSIS — N179 Acute kidney failure, unspecified: Secondary | ICD-10-CM | POA: Diagnosis not present

## 2016-07-22 DIAGNOSIS — K2971 Gastritis, unspecified, with bleeding: Secondary | ICD-10-CM | POA: Diagnosis not present

## 2016-07-22 DIAGNOSIS — K228 Other specified diseases of esophagus: Secondary | ICD-10-CM | POA: Diagnosis not present

## 2016-07-22 DIAGNOSIS — R2681 Unsteadiness on feet: Secondary | ICD-10-CM | POA: Diagnosis not present

## 2016-07-22 DIAGNOSIS — L89001 Pressure ulcer of unspecified elbow, stage 1: Secondary | ICD-10-CM

## 2016-07-22 DIAGNOSIS — Z794 Long term (current) use of insulin: Secondary | ICD-10-CM

## 2016-07-22 DIAGNOSIS — E1121 Type 2 diabetes mellitus with diabetic nephropathy: Secondary | ICD-10-CM

## 2016-07-22 DIAGNOSIS — R1084 Generalized abdominal pain: Secondary | ICD-10-CM | POA: Diagnosis not present

## 2016-07-22 DIAGNOSIS — R571 Hypovolemic shock: Secondary | ICD-10-CM | POA: Diagnosis not present

## 2016-07-22 DIAGNOSIS — E1310 Other specified diabetes mellitus with ketoacidosis without coma: Secondary | ICD-10-CM | POA: Diagnosis not present

## 2016-07-22 DIAGNOSIS — R531 Weakness: Secondary | ICD-10-CM | POA: Diagnosis not present

## 2016-07-22 DIAGNOSIS — D62 Acute posthemorrhagic anemia: Secondary | ICD-10-CM | POA: Diagnosis not present

## 2016-07-22 DIAGNOSIS — M6281 Muscle weakness (generalized): Secondary | ICD-10-CM | POA: Diagnosis not present

## 2016-07-22 DIAGNOSIS — I214 Non-ST elevation (NSTEMI) myocardial infarction: Secondary | ICD-10-CM | POA: Diagnosis present

## 2016-07-22 DIAGNOSIS — K2289 Other specified disease of esophagus: Secondary | ICD-10-CM

## 2016-07-22 LAB — GLUCOSE, CAPILLARY
Glucose-Capillary: 199 mg/dL — ABNORMAL HIGH (ref 65–99)
Glucose-Capillary: 318 mg/dL — ABNORMAL HIGH (ref 65–99)

## 2016-07-22 LAB — CBC
HEMATOCRIT: 26 % — AB (ref 36.0–46.0)
Hemoglobin: 8.6 g/dL — ABNORMAL LOW (ref 12.0–15.0)
MCH: 31.2 pg (ref 26.0–34.0)
MCHC: 33.1 g/dL (ref 30.0–36.0)
MCV: 94.2 fL (ref 78.0–100.0)
Platelets: 246 10*3/uL (ref 150–400)
RBC: 2.76 MIL/uL — ABNORMAL LOW (ref 3.87–5.11)
RDW: 15.7 % — AB (ref 11.5–15.5)
WBC: 6 10*3/uL (ref 4.0–10.5)

## 2016-07-22 LAB — MAGNESIUM: Magnesium: 1.6 mg/dL — ABNORMAL LOW (ref 1.7–2.4)

## 2016-07-22 MED ORDER — MAGNESIUM SULFATE 2 GM/50ML IV SOLN
2.0000 g | Freq: Once | INTRAVENOUS | Status: AC
Start: 2016-07-22 — End: 2016-07-22
  Administered 2016-07-22: 2 g via INTRAVENOUS
  Filled 2016-07-22: qty 50

## 2016-07-22 MED ORDER — INSULIN GLARGINE 100 UNIT/ML ~~LOC~~ SOLN: 20.0000 [IU] | SUBCUTANEOUS | 11 refills | Status: DC

## 2016-07-22 MED ORDER — SUCRALFATE 1 GM/10ML PO SUSP: 1.0000 g | Freq: Four times a day (QID) | ORAL | 0 refills | Status: DC

## 2016-07-22 MED ORDER — METOPROLOL TARTRATE 25 MG PO TABS: 12.5000 mg | ORAL_TABLET | Freq: Two times a day (BID) | ORAL | 0 refills | Status: DC

## 2016-07-22 MED ORDER — PANTOPRAZOLE SODIUM 40 MG PO TBEC: 40.0000 mg | DELAYED_RELEASE_TABLET | Freq: Two times a day (BID) | ORAL | 0 refills | Status: DC

## 2016-07-22 MED ORDER — INSULIN ASPART 100 UNIT/ML ~~LOC~~ SOLN: SUBCUTANEOUS | 12 refills | Status: DC

## 2016-07-22 NOTE — Clinical Social Work Placement (Signed)
   CLINICAL SOCIAL WORK PLACEMENT  NOTE  Date:  07/22/2016  Patient Details  Name: Katie Lozano MRN: [id number] Date of Birth: January 24, 1941  Clinical Social Work is seeking post-discharge placement for this patient at the [place] level of care (*CSW will initial, date and re-position this form in  chart as items are completed):  Yes   Patient/family provided with [place] Work Department's list of facilities offering this level of care within the geographic area requested by the patient (or if unable, by the patient's family).  Yes   Patient/family informed of their freedom to choose among providers that offer the needed level of care, that participate in Medicare, Medicaid or managed care program needed by the patient, have an available bed and are willing to accept the patient.  Yes   Patient/family informed of [facility]'s ownership interest in [facility] and [facility], as well as of the fact that they are under no obligation to receive care at these facilities.  PASRR submitted to EDS on 07/17/16     PASRR number received on 07/17/16     Existing PASRR number confirmed on       FL2 transmitted to all facilities in geographic area requested by pt/family on 07/17/16     FL2 transmitted to all facilities within larger geographic area on       Patient informed that his/her managed care company has contracts with or will negotiate with certain facilities, including the following:        Yes   Patient/family informed of bed offers received.  Patient chooses bed at [facility]     Physician recommends and patient chooses bed at      Patient to be transferred to [facility] on 07/22/16.  Patient to be transferred to facility by [facility]     Patient family notified on 07/22/16 of transfer.  Name of family member notified:  Nelle     PHYSICIAN Please sign FL2     Additional Comment:     _______________________________________________ Benard Halsted, [place] 07/22/2016, 12:29 PM

## 2016-07-22 NOTE — Progress Notes (Signed)
Patient will DC to: [place] Anticipated DC date: 07/22/16 Family notified: Sister Transport by: Corey Harold 1:30pm   Per MD patient ready for DC to [place]. RN, patient, patient's family, and facility notified of DC. Discharge Summary sent to facility. RN given number for report. DC packet on chart. Ambulance transport requested for patient.   CSW signing off.  Cedric Fishman, [place] Social Worker [phone]

## 2016-07-22 NOTE — Progress Notes (Signed)
Patient discharged to [facility] by [facility]. IVs and tele removed. Patient stable at time of discharge.

## 2016-07-22 NOTE — Telephone Encounter (Signed)
Will contact her prescribing physician (Dr. Candiss Norse) about stopping Plavix prior to EGD.

## 2016-07-22 NOTE — Progress Notes (Signed)
Physical Therapy Treatment Patient Details Name: Katie Lozano MRN: [id number] DOB: Nov 24, 1940 Today's Date: 07/22/2016    History of Present Illness 76 yo female upper GI bleed in setting of DKA, AKI, and NSTEMI / shock. EGD on 1/15 revealed non-bleeding erosive gastritis and severe esophagitis with possible acute esophageal necrosis.    PT Comments    Pt performed increased static and dynamic standing balance during functional activity prior to ambulation. Pt limited today by fatigue, no c/o of SOB. Monitored vitals during gait training on 3L of 02 = 93%. Required multiple cuing to breath through [place] to benefit from supplemental O2 during activity.  Pt to be discharged today to SNF to increase functional independence prior to returning home.    Follow Up Recommendations  SNF     Equipment Recommendations   (TBA)    Recommendations for Other Services       Precautions / Restrictions Precautions Precautions: Fall Restrictions Weight Bearing Restrictions: No    Mobility  Bed Mobility Overal bed mobility: Needs Assistance Bed Mobility: Supine to Sit     Supine to sit: Supervision     General bed mobility comments: Pt able to get out of bed by herself but did require cuing to scoot get to EOB  Transfers Overall transfer level: Needs assistance Equipment used: Rolling walker (2 wheeled) Transfers: Sit to/from Stand Sit to Stand: Min guard;Min assist         General transfer comment: min A to help initiate off EOB but decreased to min guard after that. Pt performed another sit to stand trial from toilet with one HHA and pt using one UE on rail in initiation and lowering body. Pt required v/c for proper hand placement.  Ambulation/Gait Ambulation/Gait assistance: Min guard Ambulation Distance (Feet): 10 Feet (+ 60 feet) Assistive device: Rolling walker (2 wheeled) Gait Pattern/deviations: Step-through pattern;Decreased stride length;Narrow base of support;Drifts  right/left Gait velocity: decreased   General Gait Details: Pt ambulated to bathroom then continued into hallway for second trial. No rest break required but pt did c/o of fatigue and wanted to return to room.  Required cuing to stay within boundaries of RW, obstacle negotiation due to drifting and to breathe through nose to utilize [place] supplemental O2.   Stairs            Wheelchair Mobility    Modified Rankin (Stroke Patients Only)       Balance Overall balance assessment: Needs assistance Sitting-balance support: Feet unsupported;Bilateral upper extremity supported Sitting balance-Leahy Scale: Fair Sitting balance - Comments: able to sit on EOB with B UE support   Standing balance support: Single extremity supported;During functional activity   Standing balance comment: pt able to stand with intermittent 1-2 UE support during functional activity.                    Cognition Arousal/Alertness: Awake/alert Behavior During Therapy: WFL for tasks assessed/performed Overall Cognitive Status: Within Functional Limits for tasks assessed                 General Comments: pt presents as pleasant and accepting of therapy today    Exercises      General Comments        Pertinent Vitals/Pain Pain Assessment: No/denies pain    Home Living                      Prior Function  PT Goals (current goals can now be found in the care plan section) Acute Rehab PT Goals Patient Stated Goal: take a nap PT Goal Formulation: With patient Potential to Achieve Goals: Good Progress towards PT goals: Progressing toward goals    Frequency    Min 3X/week      PT Plan Current plan remains appropriate    Co-evaluation             End of Session Equipment Utilized During Treatment: Gait belt Activity Tolerance: Patient tolerated treatment well;Patient limited by fatigue Patient left: in chair;with nursing/sitter in room;with call  bell/phone within reach (Chair alarm not working; left pt in room with nurse tech to get new call bell. )     Time: [id number] PT Time Calculation (min) (ACUTE ONLY): 40 min  Charges:  $Gait Training: 8-22 mins $Therapeutic Activity: 23-37 mins                    G Codes:      [facility] 08/13/16, 2:12 PM Olena [place], [place] Pager [phone]

## 2016-07-22 NOTE — Discharge Summary (Addendum)
Physician Discharge Summary  Katie Lozano [id number] DOB: 12/12/1940 DOA: 07/11/2016  PCP: Hoyt Koch, MD   Primary cardiologist: Dr. Ricky Ala  Admit date: 07/11/2016 Discharge date: 07/22/2016  Admitted From: Home Disposition:  Skilled nursing facility ([facility])  Recommendations for Outpatient Follow-up:  Follow-up with M.D. at SNF in 1 week. Monitor H&h and BMET in 3-4 days. Follow-up with GI (Dr. Havery Moros) in 4 weeks for repeat EGD. Follow-up with her cardiologist in 8 weeks.   Equipment/Devices: Oxygen (2 L via nasal cannula), titrate as improved. Remaining per therapy at the facility.  Discharge Condition: Fair CODE STATUS: Full code Diet recommendation: Heart Healthy / Carb Modified     Discharge Diagnoses:  Principal Problem:   Hypovolemic shock ([place])   Active Problems:   UGIB (upper gastrointestinal bleed)   Esophageal necrosis   Type 2 diabetes mellitus with diabetic nephropathy, with long-term current use of insulin (HCC)   Hyperlipidemia   Essential hypertension   Hypokalemia   Diabetic ketoacidosis without coma associated with type 2 diabetes mellitus (HCC)   AKI (acute kidney injury) ([place])   Acute blood loss anemia   Metabolic acidosis   Pressure injury of skin   NSTEMI (non-ST elevated myocardial infarction) ([place])  Brief narrative/history of present illness  76 y/o female who presented with DKA, shock, AKI, NSTEMI, who developed an upper GI bleed with melena. Patient also has history ofDM2 and recent TIA who was admitted on 1/13 with acute encephalopathy from DKA after a viral gastroenteritis. This has been associated with shock, NSTEMI and lactic acidosis. Patient required ICU admission for management of shock, DKA and upper GI bleed.Marland Kitchen  Hospital course Acute esophageal necrosis As seen on EGD from 07/14/2016. GI recommendations likely associated with ischemic injury from her DKA with shock. Patient monitored further given  high risk for esophageal perforation. GI also recommended that his high risk for symphysis stricture in the long-term and needs outpatient evaluation with repeat EGD. As recommended she is placed on Protonix twice a day and Carafate 1 g every 6 hours. Patient instructed that Carafate can cause loose or dark stool. Her H&H remains stable and she is tolerating diet. She should follow up with GI in 4 weeks for repeat EGD.  Hypovolemic shock with severe DKA Managed in ICU. Resolved.   Acute hypoxic respiratory failure  possibly due to sepsis and diastolic dysfn. Now on 2l via [place] . Improved with prn nebs. Titrate o2 as tolerated.  Possible NSTEMI - stable, no chest pain, aspirin and plavix  areOn holddue to concerns about GI bleed and esophageal necrosis. 2d echo with normal EF and grade 1 diastolic dysfn. patinet saw her cardiologist Dr Darral Dash about 6 weeks back for ? Chest pain and abnormal EKG with plan on echo and stress test as outpt. Will resume her ACEi, add low dose BB and continue statin. Holding AS and plavix. F/up with cardiology in 8 weeks.     Hypokalemia/hypomagnesemia- Secondary to dehydration and GI loss. carafate contributing as well. Replenished. D/c HCTZ.   Anemia/thrombocytopenia-  +FOBT, mild drop in Hgb but stable. Received 1u PRBC on 1/20. Follow up as oupt  Uncontrolled DM with DKA Required ICU admission with severe dehydration , shock and encephalopathy. A1C of 8.8. Resumed lantus at 20 units daily with SSI meal coverage. Adjust dose as outpt.   Generalized weakness/debility- PT recommends SNF     Discharge Instructions   Allergies as of 07/22/2016   No Known Allergies  Medication List    STOP taking these medications   aspirin EC 81 MG tablet   CALCIUM + D3 PO   clopidogrel 75 MG tablet Commonly known as:  PLAVIX   HUMALOG KWIKPEN 200 UNIT/ML Sopn Generic drug:  Insulin Lispro   hydrochlorothiazide 25 MG tablet Commonly known  as:  HYDRODIURIL   omeprazole 20 MG tablet Commonly known as:  PRILOSEC OTC     TAKE these medications   D3 SUPER STRENGTH 2000 units Caps Generic drug:  Cholecalciferol Take by mouth.   ezetimibe 10 MG tablet Commonly known as:  ZETIA Take 5 mg by mouth daily.   insulin aspart 100 UNIT/ML injection Commonly known as:  NOVOLOG Before each meal 3 times a day, 140-199 - 4 units, 200-250 - 6 units, 251-299 - 8 units,  300-349 - 12 units,  350 -400 16 units; >400 call MD. Dispense syringes and needles as needed, Ok to switch to PEN if approved.   insulin glargine 100 UNIT/ML injection Commonly known as:  LANTUS Inject into the skin See admin instructions. Inject 20 units subcutaneously at bedtime   lisinopril 10 MG tablet Commonly known as:  PRINIVIL,ZESTRIL Take 10 mg by mouth daily.   metoprolol tartrate 25 MG tablet Commonly known as:  LOPRESSOR Take 0.5 tablets (12.5 mg total) by mouth 2 (two) times daily.   MULTI VITAMIN DAILY Tabs Take by mouth.   pantoprazole 40 MG tablet Commonly known as:  PROTONIX Take 1 tablet (40 mg total) by mouth 2 (two) times daily. Switch for any other PPI at similar dose and frequency   simvastatin 20 MG tablet Commonly known as:  ZOCOR Take 20 mg by mouth daily.   sucralfate 1 GM/10ML suspension Commonly known as:  CARAFATE Take 10 mLs (1 g total) by mouth every 6 (six) hours.   TRULICITY [id number] Sopn Generic drug:  Dulaglutide Inject 0.75 mg into the skin once a week.      Follow-up Information    MD at SNF Follow up in 1 week(s).        Manus Gunning, MD. Schedule an appointment as soon as possible for a visit in 4 week(s).   Specialty:  Gastroenterology Contact information: [place]  [address] [phone]          No Known Allergies   Procedures/Studies: Portable Chest X-ray (1 View)  Result Date: 07/11/2016 CLINICAL DATA:  Central line placement EXAM: PORTABLE CHEST 1  VIEW COMPARISON:  07/11/2016 FINDINGS: Multiple support leads obscure the chest. Insertion of left-sided central venous catheter, the tip is faintly visualized over the proximal right atrium. No pneumothorax. Low lung volumes without acute infiltrate. Mild subsegmental atelectasis at the left CP angle. Stable cardiomediastinal silhouette. Surgical clips in the right upper quadrant. IMPRESSION: Left-sided central venous catheter tip overlies the proximal right atrium. No pneumothorax. Electronically Signed   By: Donavan Foil M.D.   On: 07/11/2016 16:45   Dg Chest Port 1 View  Result Date: 07/11/2016 CLINICAL DATA:  Unresponsive, nausea and vomiting EXAM: PORTABLE CHEST 1 VIEW COMPARISON:  06/05/2016 FINDINGS: Borderline cardiomegaly. Elevation of the right hemidiaphragm again noted. No infiltrate or pulmonary edema. Central mild vascular congestion. IMPRESSION: Cardiomegaly. Central mild vascular congestion without convincing pulmonary edema. No segmental infiltrate. Electronically Signed   By: Lahoma Crocker M.D.   On: 07/11/2016 15:34     2d echo Study Conclusions  - Left ventricle: The cavity size was normal. Wall thickness was  normal. Septal-lateral dyssynchrony consistent with LBBB.   Systolic function was normal. The estimated ejection fraction was   in the range of 55% to 60%. Basal inferior and basal inferoseptal   hypokinesis. Doppler parameters are consistent with abnormal left   ventricular relaxation (grade 1 diastolic dysfunction). - Aortic valve: There was no stenosis. - Mitral valve: There was no significant regurgitation. - Right ventricle: The cavity size was normal. Systolic function   was normal. - Tricuspid valve: Peak RV-RA gradient (S): 29 mm Hg. - Pulmonary arteries: PA peak pressure: 32 mm Hg (S). - Inferior vena cava: The vessel was normal in size. The   respirophasic diameter changes were in the normal range (>= 50%),   consistent with normal central venous  pressure.  Subjective: feels weak but much imrpoved  Discharge Exam: Vitals:   07/21/16 2133 07/22/16 0513  BP: (!) 134/46 (!) 136/48  Pulse: 86 83  Resp: 18 (!) 22  Temp: 99.1 F (37.3 C) 98.8 F (37.1 C)   Vitals:   07/21/16 0451 07/21/16 1424 07/21/16 2133 07/22/16 0513  BP: (!) 126/41 (!) 125/49 (!) 134/46 (!) 136/48  Pulse: 83 83 86 83  Resp: 18 18 18  (!) 22  Temp: 97.6 F (36.4 C) 98.2 F (36.8 C) 99.1 F (37.3 C) 98.8 F (37.1 C)  TempSrc: Oral Oral Oral Oral  SpO2: 92% 98% 96% 95%  Weight:      Height:        General:  not in acute distress HEENT: pallor present, moist mucosa Cardiovascular: NS1&S2, no murmurs chest: CTA bilaterally, no wheezing, no rhonchi GI: Soft, NT, ND, bowel sounds + Musculoskeletal: warm,  no edema,    The results of significant diagnostics from this hospitalization (including imaging, microbiology, ancillary and laboratory) are listed below for reference.     Microbiology: No results found for this or any previous visit (from the past 240 hour(s)).   Labs: BNP (last 3 results) No results for input(s): BNP in the last 8760 hours. Basic Metabolic Panel:  Recent Labs Lab 07/19/16 0447 07/20/16 0550 07/20/16 1700 07/21/16 0709 07/21/16 1841 07/22/16 0851  NA 138 139 134* 140 139  --   K 3.3* 2.5* 3.5 2.7* 3.5  --   CL 113* 109 107 111 110  --   CO2 20* 22 19* 23 20*  --   GLUCOSE 128* 60* 336* 74 221*  --   BUN <5* <5* <5* <5* <5*  --   CREATININE 0.62 0.68 0.76 0.68 0.84  --   CALCIUM 7.2* 7.4* 7.1* 7.3* 7.6*  --   MG 1.3* 1.3*  --  1.8  --  1.6*  PHOS 2.2*  --   --   --   --   --    Liver Function Tests:  Recent Labs Lab 07/20/16 0550 07/21/16 1841  AST 31 40  ALT 23 23  ALKPHOS 84 92  BILITOT 0.5 0.3  PROT 4.7* 5.1*  ALBUMIN 1.6* 1.8*   No results for input(s): LIPASE, AMYLASE in the last 168 hours. No results for input(s): AMMONIA in the last 168 hours. CBC:  Recent Labs Lab 07/18/16 0430  07/19/16 0447 07/20/16 0550 07/21/16 0709 07/22/16 0851  WBC 9.5 11.1* 12.5* 10.2 6.0  NEUTROABS  --   --  8.6*  --   --   HGB 7.2* 7.1* 9.4* 8.7* 8.6*  HCT 20.6* 20.8* 27.3* 26.1* 26.0*  MCV 94.5 95.4 91.9 93.9 94.2  PLT 173 186 223 241 246  Cardiac Enzymes: No results for input(s): CKTOTAL, CKMB, CKMBINDEX, TROPONINI in the last 168 hours. BNP: Invalid input(s): POCBNP CBG:  Recent Labs Lab 07/21/16 0745 07/21/16 1215 07/21/16 1637 07/21/16 2136 07/22/16 0808  GLUCAP 94 165* 188* 279* 199*   D-Dimer No results for input(s): DDIMER in the last 72 hours. Hgb A1c No results for input(s): HGBA1C in the last 72 hours. Lipid Profile No results for input(s): CHOL, HDL, LDLCALC, TRIG, CHOLHDL, LDLDIRECT in the last 72 hours. Thyroid function studies No results for input(s): TSH, T4TOTAL, T3FREE, THYROIDAB in the last 72 hours.  Invalid input(s): FREET3 Anemia work up No results for input(s): VITAMINB12, FOLATE, FERRITIN, TIBC, IRON, RETICCTPCT in the last 72 hours. Urinalysis    Component Value Date/Time   COLORURINE YELLOW 07/11/2016 2024   APPEARANCEUR CLEAR 07/11/2016 2024   LABSPEC 1.014 07/11/2016 2024   PHURINE 5.0 07/11/2016 2024   GLUCOSEU >=500 (A) 07/11/2016 2024   HGBUR LARGE (A) 07/11/2016 2024   BILIRUBINUR NEGATIVE 07/11/2016 2024   KETONESUR 20 (A) 07/11/2016 2024   PROTEINUR NEGATIVE 07/11/2016 2024   NITRITE NEGATIVE 07/11/2016 2024   LEUKOCYTESUR NEGATIVE 07/11/2016 2024   Sepsis Labs Invalid input(s): PROCALCITONIN,  WBC,  LACTICIDVEN Microbiology No results found for this or any previous visit (from the past 240 hour(s)).   Time coordinating discharge: Over 30 minutes  SIGNED:   Louellen Molder, MD  [facility] 07/22/2016, 11:39 AM Pager   If 7PM-7AM, please contact night-coverage [email or web address] Password [email or web address]

## 2016-07-22 NOTE — Progress Notes (Addendum)
Attempted to call report to [facility], no answer. Will try again later.  Called report to [place] at [facility].

## 2016-07-22 NOTE — Telephone Encounter (Signed)
-----   Message from Manus Gunning, MD sent at 07/21/2016  4:54 PM EST ----- It would be preferable to hold for 5 days before the procedure and stay on aspirin 81mg , but would be approval from her prescribing physician. It appears she is still hospitalized, you can hold off on scheduling for a few days until she is home. Thanks much   ----- Message ----- From: Doristine Counter, RN Sent: 07/21/2016  10:51 AM To: Manus Gunning, MD  Patient is on Plavix, what are your recommendations on stopping this? Thank you.  ----- Message ----- From: Manus Gunning, MD Sent: 07/18/2016   8:14 AM To: Doristine Counter, RN  Almyra Free this patient is being discharged likely over the weekend. She will need a follow up EGD with me in about a month if you can coordinate sometime next week. Thanks

## 2016-07-23 ENCOUNTER — Non-Acute Institutional Stay (SKILLED_NURSING_FACILITY): Payer: PPO | Admitting: Internal Medicine

## 2016-07-23 ENCOUNTER — Encounter: Payer: Self-pay | Admitting: Internal Medicine

## 2016-07-23 DIAGNOSIS — Z794 Long term (current) use of insulin: Secondary | ICD-10-CM

## 2016-07-23 DIAGNOSIS — E1121 Type 2 diabetes mellitus with diabetic nephropathy: Secondary | ICD-10-CM | POA: Diagnosis not present

## 2016-07-23 DIAGNOSIS — K209 Esophagitis, unspecified without bleeding: Secondary | ICD-10-CM

## 2016-07-23 DIAGNOSIS — G451 Carotid artery syndrome (hemispheric): Secondary | ICD-10-CM | POA: Diagnosis not present

## 2016-07-23 DIAGNOSIS — N183 Chronic kidney disease, stage 3 unspecified: Secondary | ICD-10-CM

## 2016-07-23 DIAGNOSIS — E8809 Other disorders of plasma-protein metabolism, not elsewhere classified: Secondary | ICD-10-CM | POA: Diagnosis not present

## 2016-07-23 DIAGNOSIS — D62 Acute posthemorrhagic anemia: Secondary | ICD-10-CM

## 2016-07-23 DIAGNOSIS — R531 Weakness: Secondary | ICD-10-CM | POA: Diagnosis not present

## 2016-07-23 DIAGNOSIS — I10 Essential (primary) hypertension: Secondary | ICD-10-CM | POA: Diagnosis not present

## 2016-07-23 NOTE — Progress Notes (Signed)
LOCATION: [place]  PCP: Hoyt Koch, MD   Code Status: Full Code  Goals of care: Advanced Directive information Advanced Directives 07/16/2016  Does Patient Have a Medical Advance Directive? Yes  Type of Paramedic of [place];Living will  Does patient want to make changes to medical advance directive? No - Patient declined  Copy of [place] in Chart? No - copy requested       Extended Emergency Contact Information Primary Emergency Contact: [place] Phone: [phone] Relation: None Secondary Emergency Contact: Parlier,Nelle Mellody [place] Mobile Phone: [phone] Relation: Sister   No Known Allergies  Chief Complaint  Patient presents with  . New Admit To SNF    New Admission Visit      HPI:  Patient is a 76 y.o. female seen today for short term rehabilitation post hospital admission from 07/11/2016-07/22/2016 with hypovolemic shock with acute encephalopathy. She had DKA, viral gastroenteritis and NSTEMI. While being treated for this, she developed melena and GI was consulted. She underwent EGD on 07/14/16 showing severe esophagitis with non erosive gastropathy and hiatal hernia. She was placed on iv PPI and later switched to po protonix and carafate. She also received PRBC. She has medical history of DM, TIA, GERD, hyperlipidemia among others. She is seen in her room today.   Review of Systems:  Constitutional: Negative for fever, chills, diaphoresis. Energy level is slowly coming back. HENT: Negative for headache,nasal discharge, sore throat, difficulty swallowing. Positive for nasal congestion.  Eyes: Negative for eye pain, blurred vision, double vision and discharge.  Respiratory: Negative for cough, shortness of breath and wheezing.   Cardiovascular: Negative for chest pain, palpitations, leg swelling.  Gastrointestinal: Negative for heartburn,  nausea, vomiting, abdominal pain, loss of appetite, melena, diarrhea and constipation. Last bowel movement was yesterday.  Genitourinary: Negative for dysuria and flank pain.  Musculoskeletal: Negative for back pain, fall in the facilty.  Skin: Negative for itching, rash.  Neurological: Negative for dizziness. Psychiatric/Behavioral: Negative for depression   Past Medical History:  Diagnosis Date  . Allergy   . Anemia associated with acute blood loss 06/2016  . Colon polyp   . Diabetes mellitus without complication ([place])   . Diverticulitis   . DKA (diabetic ketoacidoses) ([place]) 07/11/2016  . GERD (gastroesophageal reflux disease)   . Hematemesis 07/11/2016  . Hyperlipidemia   . Melena 06/2016  . UGIB (upper gastrointestinal bleed) 06/2016   Past Surgical History:  Procedure Laterality Date  . APPENDECTOMY  1956  . CHOLECYSTECTOMY  1997  . ESOPHAGOGASTRODUODENOSCOPY N/A 07/14/2016   Procedure: ESOPHAGOGASTRODUODENOSCOPY (EGD);  Surgeon: Manus Gunning, MD;  Location: [facility];  Service: Gastroenterology;  Laterality: N/A;  at bedside  . TONSILLECTOMY     age 47   Social History:   reports that she quit smoking about 4 years ago. Her smoking use included Cigarettes. She has never used smokeless tobacco. She reports that she does not drink alcohol or use drugs.  Family History  Problem Relation Age of Onset  . Heart disease Mother   . Melanoma Mother   . Heart disease Father     MI  . Diabetes Father   . Colon cancer Maternal Uncle   . Colon polyps Maternal Uncle     Medications: Allergies as of 07/23/2016   No Known Allergies     Medication List       Accurate as of 07/23/16  2:23  PM. Always use your most recent med list.          D3 SUPER STRENGTH 2000 units Caps Generic drug:  Cholecalciferol Take 2,000 Units by mouth daily.   ezetimibe 10 MG tablet Commonly known as:  ZETIA Take 5 mg by mouth daily.   insulin aspart 100 UNIT/ML  injection Commonly known as:  NOVOLOG Before each meal 3 times a day, 140-199 - 4 units, 200-250 - 6 units, 251-299 - 8 units,  300-349 - 12 units,  350 -400 16 units; >400 call MD. Dispense syringes and needles as needed, Ok to switch to PEN if approved.   insulin glargine 100 UNIT/ML injection Commonly known as:  LANTUS Inject 20 Units into the skin at bedtime.   lisinopril 10 MG tablet Commonly known as:  PRINIVIL,ZESTRIL Take 10 mg by mouth daily.   metoprolol tartrate 25 MG tablet Commonly known as:  LOPRESSOR Take 0.5 tablets (12.5 mg total) by mouth 2 (two) times daily.   MULTI VITAMIN DAILY Tabs Take 1 tablet by mouth daily.   pantoprazole 40 MG tablet Commonly known as:  PROTONIX Take 1 tablet (40 mg total) by mouth 2 (two) times daily. Switch for any other PPI at similar dose and frequency   simvastatin 20 MG tablet Commonly known as:  ZOCOR Take 20 mg by mouth daily.   sucralfate 1 GM/10ML suspension Commonly known as:  CARAFATE Take 10 mLs (1 g total) by mouth every 6 (six) hours.   TRULICITY [id number] Sopn Generic drug:  Dulaglutide Inject 0.75 mg into the skin once a week.       Immunizations: Immunization History  Administered Date(s) Administered  . PPD Test 07/22/2016     Physical Exam: Vitals:   07/23/16 1417  BP: (!) 162/66  Pulse: 83  Resp: 20  Temp: 99.8 F (37.7 C)  TempSrc: Oral  SpO2: 94%  Weight: 159 lb (72.1 kg)  Height: 5\' 4"  (1.626 m)   Body mass index is 27.29 kg/m.  General- elderly female, well built, in no acute distress Head- normocephalic, atraumatic Nose- no maxillary or frontal sinus tenderness, no nasal discharge Throat- moist mucus membrane  Eyes- PERRLA, EOMI, no pallor, no icterus, no discharge, normal conjunctiva, normal sclera Neck- no cervical lymphadenopathy Cardiovascular- normal s1,s2, no murmur Respiratory- bilateral clear to auscultation, no wheeze, no rhonchi, no crackles, no use of accessory  muscles, on 3 litres o2 by nasal canula Abdomen- bowel sounds present, soft, non tender, no guarding or rigidity, no CVA tenderness Musculoskeletal- able to move all 4 extremities, generalized weakness, trace leg edema Neurological- alert and oriented to person, place and time Skin- warm and dry Psychiatry- normal mood and affect    Labs reviewed: Basic Metabolic Panel:  Recent Labs  07/14/16 0350  07/14/16 2036  07/19/16 0447 07/20/16 0550 07/20/16 1700 07/21/16 0709 07/21/16 1841 07/22/16 0851  NA 148*  --   --   < > 138 139 134* 140 139  --   K 2.5*  < >  --   < > 3.3* 2.5* 3.5 2.7* 3.5  --   CL 113*  --   --   < > 113* 109 107 111 110  --   CO2 26  --   --   < > 20* 22 19* 23 20*  --   GLUCOSE 202*  --   --   < > 128* 60* 336* 74 221*  --   BUN 25*  --   --   < > <  5* <5* <5* <5* <5*  --   CREATININE 0.88  --   --   < > 0.62 0.68 0.76 0.68 0.84  --   CALCIUM 7.3*  --   --   < > 7.2* 7.4* 7.1* 7.3* 7.6*  --   MG 1.5*  --  2.4  --  1.3* 1.3*  --  1.8  --  1.6*  PHOS <1.0*  --  1.6*  --  2.2*  --   --   --   --   --   < > = values in this interval not displayed. Liver Function Tests:  Recent Labs  07/11/16 1511 07/20/16 0550 07/21/16 1841  AST 63* 31 40  ALT 29 23 23   ALKPHOS 85 84 92  BILITOT 1.2 0.5 0.3  PROT 5.9* 4.7* 5.1*  ALBUMIN 2.7* 1.6* 1.8*   No results for input(s): LIPASE, AMYLASE in the last 8760 hours. No results for input(s): AMMONIA in the last 8760 hours. CBC:  Recent Labs  07/11/16 1511  07/20/16 0550 07/21/16 0709 07/22/16 0851  WBC 23.4*  < > 12.5* 10.2 6.0  NEUTROABS 16.2*  --  8.6*  --   --   HGB 11.9*  < > 9.4* 8.7* 8.6*  HCT 40.3  < > 27.3* 26.1* 26.0*  MCV 110.1*  < > 91.9 93.9 94.2  PLT 268  < > 223 241 246  < > = values in this interval not displayed. Cardiac Enzymes:  Recent Labs  07/12/16 1243 07/12/16 1837 07/13/16 0025  TROPONINI 0.91* 0.82* 0.65*   BNP: Invalid input(s): POCBNP CBG:  Recent Labs   07/21/16 2136 07/22/16 0808 07/22/16 1248  GLUCAP 279* 199* 318*    Radiological Exams: Portable Chest X-ray (1 View)  Result Date: 07/11/2016 CLINICAL DATA:  Central line placement EXAM: PORTABLE CHEST 1 VIEW COMPARISON:  07/11/2016 FINDINGS: Multiple support leads obscure the chest. Insertion of left-sided central venous catheter, the tip is faintly visualized over the proximal right atrium. No pneumothorax. Low lung volumes without acute infiltrate. Mild subsegmental atelectasis at the left CP angle. Stable cardiomediastinal silhouette. Surgical clips in the right upper quadrant. IMPRESSION: Left-sided central venous catheter tip overlies the proximal right atrium. No pneumothorax. Electronically Signed   By: Donavan Foil M.D.   On: 07/11/2016 16:45   Dg Chest Port 1 View  Result Date: 07/11/2016 CLINICAL DATA:  Unresponsive, nausea and vomiting EXAM: PORTABLE CHEST 1 VIEW COMPARISON:  06/05/2016 FINDINGS: Borderline cardiomegaly. Elevation of the right hemidiaphragm again noted. No infiltrate or pulmonary edema. Central mild vascular congestion. IMPRESSION: Cardiomegaly. Central mild vascular congestion without convincing pulmonary edema. No segmental infiltrate. Electronically Signed   By: Lahoma Crocker M.D.   On: 07/11/2016 15:34    Assessment/Plan  Generalized weakness From physical deconditioning. Will have her work with physical therapy and outpatient therapy to help regain her strength and restore her balance. Fall precautions to be taken  Acute Severe esophagitis Seen on EGD. Continue Carafate 10 ML 4 times a day and Protonix 40 mg twice a day for now. Patient to follow with gastroenterology in 4 weeks. Monitor for signs of bleeding. Denies any heartburn or indigestion, nausea or vomiting this visit. No further melena reported.  Insulin-dependent diabetes mellitus With recent hospital admission for diabetic ketoacidosis. Currently on Lantus 20 units subcutaneously daily with  sliding scale insulin NovoLog. Monitor blood sugar reading. Check bmp Lab Results  Component Value Date   HGBA1C 8.8 (H) 07/11/2016   Acute blood loss  anemia From upper GI bleed. Status post 1 unit packed red blood cell transfusion on 07/19/2016. Monitor CBC  Hypoalbuminemia Low albumin level on Review. Concern for malnutrition. Will have registered dietitian to evaluate.  Hypertension Stable blood pressure reading. Denies any dizziness. Continue metoprolol succinate 12.5 mg twice a day and lisinopril 10 mg daily with holding parameters  Coronary artery disease Remains chest pain-free. Continue Zocor 20 mg daily, lisinopril 10 mg daily, metoprolol succinate 12.5 mg twice a day for now. Currently aspirin and Plavix has been on hold given her recent GI bleed. Patient to follow with cardiology.  ckd stage 3 Check bmp, avoid nephrotoxins    Goals of care: short term rehabilitation   Labs/tests ordered: cbc, bmp 07/28/16  Family/ staff Communication: reviewed care plan with patient and nursing supervisor    Blanchie Serve, MD Internal Medicine [place], [place] [address] Cell Phone (Monday-Friday 8 am - 5 pm): [phone] On Call: [phone] and follow prompts after 5 pm and on weekends Office Phone: [phone] Office Fax: [phone]

## 2016-07-28 ENCOUNTER — Non-Acute Institutional Stay (SKILLED_NURSING_FACILITY): Payer: PPO | Admitting: Adult Health

## 2016-07-28 ENCOUNTER — Encounter: Payer: Self-pay | Admitting: Adult Health

## 2016-07-28 DIAGNOSIS — Z794 Long term (current) use of insulin: Secondary | ICD-10-CM | POA: Diagnosis not present

## 2016-07-28 DIAGNOSIS — R2681 Unsteadiness on feet: Secondary | ICD-10-CM | POA: Diagnosis not present

## 2016-07-28 DIAGNOSIS — R4182 Altered mental status, unspecified: Secondary | ICD-10-CM | POA: Diagnosis not present

## 2016-07-28 DIAGNOSIS — R600 Localized edema: Secondary | ICD-10-CM | POA: Diagnosis not present

## 2016-07-28 DIAGNOSIS — Z7901 Long term (current) use of anticoagulants: Secondary | ICD-10-CM | POA: Diagnosis not present

## 2016-07-28 DIAGNOSIS — E1121 Type 2 diabetes mellitus with diabetic nephropathy: Secondary | ICD-10-CM | POA: Diagnosis not present

## 2016-07-28 DIAGNOSIS — M6281 Muscle weakness (generalized): Secondary | ICD-10-CM | POA: Diagnosis not present

## 2016-07-28 DIAGNOSIS — E876 Hypokalemia: Secondary | ICD-10-CM | POA: Diagnosis not present

## 2016-07-28 DIAGNOSIS — E1300 Other specified diabetes mellitus with hyperosmolarity without nonketotic hyperglycemic-hyperosmolar coma (NKHHC): Secondary | ICD-10-CM | POA: Diagnosis not present

## 2016-07-28 DIAGNOSIS — E1140 Type 2 diabetes mellitus with diabetic neuropathy, unspecified: Secondary | ICD-10-CM | POA: Diagnosis not present

## 2016-07-28 DIAGNOSIS — K2971 Gastritis, unspecified, with bleeding: Secondary | ICD-10-CM | POA: Diagnosis not present

## 2016-07-28 DIAGNOSIS — D62 Acute posthemorrhagic anemia: Secondary | ICD-10-CM | POA: Diagnosis not present

## 2016-07-28 DIAGNOSIS — K228 Other specified diseases of esophagus: Secondary | ICD-10-CM | POA: Diagnosis not present

## 2016-07-28 LAB — CBC AND DIFFERENTIAL
HCT: 32 % — AB (ref 36–46)
Hemoglobin: 10.3 g/dL — AB (ref 12.0–16.0)
PLATELETS: 301 10*3/uL (ref 150–399)
WBC: 6.4 10^3/mL

## 2016-07-28 LAB — BASIC METABOLIC PANEL
BUN: 5 mg/dL (ref 4–21)
CREATININE: 0.6 mg/dL (ref 0.5–1.1)
Glucose: 155 mg/dL
POTASSIUM: 3.3 mmol/L — AB (ref 3.4–5.3)
Sodium: 143 mmol/L (ref 137–147)

## 2016-07-28 NOTE — Progress Notes (Addendum)
DATE:  07/28/2016   MRN:  [id number]  BIRTHDAY: 01/01/1941  Facility:  Nursing Home Location:  [place] Room Number: 1206-P  LEVEL OF CARE:  SNF [phone])  Contact Information    Name Relation Home Work Mobile   [place]  [phone]     [place]   [id number]       Code Status History    Date Active Date Inactive Code Status Order ID Comments User Context   07/11/2016  4:16 PM 07/22/2016  6:39 PM Full Code [id number]  Erick Colace, NP ED   06/06/2016 12:26 AM 06/06/2016  9:06 PM DNR [id number]  Edwin Dada, MD Inpatient       Chief Complaint  Patient presents with  . Acute Visit    Diabetes management, BLE edema    HISTORY OF PRESENT ILLNESS:  This is a 76 year old female who has been refusing her Trulicity. She said that she had it only one time before being hospitalized. She had felt nauseous, no appetite and "felt really bad." CBGs 181, 284, 206, 226, 252. She complained of BLE edema 1+, non-tender. She is breathing evenly and currently on O2 @ 1L/min.  She has been admitted to [facility] on 07/22/16 from [facility] admission 07/11/16 to 07/22/16 with hypovolemic shock with acute encephalopathy. She had DKA, viral gastroenteritis and NSTEMI. She had melena and GI was consulted. She had EGD done on 07/14/16 showing severe esophagitis with non-erosive gastropathy and hiatal hernia. She was placed on IV PPI and later switched to oral Protonix and Carafate. She had blood transfusion of PRBC.   PAST MEDICAL HISTORY:  Past Medical History:  Diagnosis Date  . Allergy   . Anemia associated with acute blood loss 06/2016  . Colon polyp   . Diabetes mellitus without complication ([place])   . Diverticulitis   . DKA (diabetic ketoacidoses) ([place]) 07/11/2016  . GERD (gastroesophageal reflux disease)   . Hematemesis 07/11/2016  . Hyperlipidemia   . Melena 06/2016  . UGIB (upper gastrointestinal bleed)  06/2016     CURRENT MEDICATIONS: Reviewed  Patient's Medications  New Prescriptions   No medications on file  Previous Medications   CHOLECALCIFEROL (D3 SUPER STRENGTH) 2000 UNITS CAPS    Take 2,000 Units by mouth daily.    EZETIMIBE (ZETIA) 10 MG TABLET    Take 5 mg by mouth daily. Take 1/2 tablet to = 5 mg   INSULIN ASPART (NOVOLOG) 100 UNIT/ML INJECTION    Before each meal 3 times a day, 140-199 - 4 units, 200-250 - 6 units, 251-299 - 8 units,  300-349 - 12 units,  350 -400 16 units; >400 call MD. Dispense syringes and needles as needed, Ok to switch to PEN if approved.   INSULIN GLARGINE (LANTUS) 100 UNIT/ML INJECTION    Inject 20 Units into the skin at bedtime.   LISINOPRIL (PRINIVIL,ZESTRIL) 10 MG TABLET    Take 10 mg by mouth daily.   METOPROLOL TARTRATE (LOPRESSOR) 25 MG TABLET    Take 0.5 tablets (12.5 mg total) by mouth 2 (two) times daily.   MULTIPLE VITAMIN (MULTI VITAMIN DAILY) TABS    Take 1 tablet by mouth daily.    PANTOPRAZOLE (PROTONIX) 40 MG TABLET    Take 1 tablet (40 mg total) by mouth 2 (two) times daily. Switch for any other PPI at similar dose and frequency   SIMVASTATIN (ZOCOR) 20 MG TABLET    Take 20 mg  by mouth daily.    SUCRALFATE (CARAFATE) 1 GM/10ML SUSPENSION    Take 10 mLs (1 g total) by mouth every 6 (six) hours.  Modified Medications   No medications on file  Discontinued Medications   DULAGLUTIDE (TRULICITY) [id number] SOPN    Inject 0.75 mg into the skin once a week.      No Known Allergies   REVIEW OF SYSTEMS:  GENERAL: no change in appetite, no fatigue, no weight changes, no fever, chills or weakness EYES: Denies change in vision, dry eyes, eye pain, itching or discharge EARS: Denies change in hearing, ringing in ears, or earache NOSE: Denies nasal congestion or epistaxis MOUTH and THROAT: Denies oral discomfort, gingival pain or bleeding, pain from teeth or hoarseness   RESPIRATORY: no cough, SOB, DOE, wheezing, hemoptysis CARDIAC: no  chest pain,  or palpitations GI: no abdominal pain, diarrhea, constipation, heart burn, nausea or vomiting GU: Denies dysuria, frequency, hematuria, incontinence, or discharge PSYCHIATRIC: Denies feeling of depression or anxiety. No report of hallucinations, insomnia, paranoia, or agitation    PHYSICAL EXAMINATION  GENERAL APPEARANCE: Well nourished. In no acute distress. Normal body habitus SKIN:  Skin is warm and dry.  HEAD: Normal in size and contour. No evidence of trauma EYES: Lids open and close normally. No blepharitis, entropion or ectropion. PERRL. Conjunctivae are clear and sclerae are white. Lenses are without opacity EARS: Pinnae are normal. Patient hears normal voice tunes of the examiner MOUTH and THROAT: Lips are without lesions. Oral mucosa is moist and without lesions. Tongue is normal in shape, size, and color and without lesions NECK: supple, trachea midline, no neck masses, no thyroid tenderness, no thyromegaly LYMPHATICS: no LAN in the neck, no supraclavicular LAN RESPIRATORY: breathing is even & unlabored, BS CTAB, on O2 @ 2L/min via [place] CARDIAC: RRR, no murmur,no extra heart sounds, BLE 1+ edema GI: abdomen soft, normal BS, no masses, no tenderness, no hepatomegaly, no splenomegaly EXTREMITIES:  Able to move X 4 extremities PSYCHIATRIC: Alert and oriented X 3. Affect and behavior are appropriate    LABS/RADIOLOGY: Labs reviewed: 07/28/16  WBC 6.4 hemoglobin 10.3 MCV 99.1 platelets 301 sodium 143  K3.3  glucose 155 BUN 5 creatinine 0.63 GFR>60 calcium 7.9 Basic Metabolic Panel:  Recent Labs  07/14/16 0350  07/14/16 2036  07/19/16 0447 07/20/16 0550 07/20/16 1700 07/21/16 0709 07/21/16 1841 07/22/16 0851  NA 148*  --   --   < > 138 139 134* 140 139  --   K 2.5*  < >  --   < > 3.3* 2.5* 3.5 2.7* 3.5  --   CL 113*  --   --   < > 113* 109 107 111 110  --   CO2 26  --   --   < > 20* 22 19* 23 20*  --   GLUCOSE 202*  --   --   < > 128* 60* 336* 74 221*  --     BUN 25*  --   --   < > <5* <5* <5* <5* <5*  --   CREATININE 0.88  --   --   < > 0.62 0.68 0.76 0.68 0.84  --   CALCIUM 7.3*  --   --   < > 7.2* 7.4* 7.1* 7.3* 7.6*  --   MG 1.5*  --  2.4  --  1.3* 1.3*  --  1.8  --  1.6*  PHOS <1.0*  --  1.6*  --  2.2*  --   --   --   --   --   < > =  values in this interval not displayed. Liver Function Tests:  Recent Labs  07/11/16 1511 07/20/16 0550 07/21/16 1841  AST 63* 31 40  ALT 29 23 23   ALKPHOS 85 84 92  BILITOT 1.2 0.5 0.3  PROT 5.9* 4.7* 5.1*  ALBUMIN 2.7* 1.6* 1.8*   CBC:  Recent Labs  07/11/16 1511  07/20/16 0550 07/21/16 0709 07/22/16 0851  WBC 23.4*  < > 12.5* 10.2 6.0  NEUTROABS 16.2*  --  8.6*  --   --   HGB 11.9*  < > 9.4* 8.7* 8.6*  HCT 40.3  < > 27.3* 26.1* 26.0*  MCV 110.1*  < > 91.9 93.9 94.2  PLT 268  < > 223 241 246  < > = values in this interval not displayed.  Lipid Panel:  Recent Labs  06/05/16 2338  HDL 43   Cardiac Enzymes:  Recent Labs  07/12/16 1243 07/12/16 1837 07/13/16 0025  TROPONINI 0.91* 0.82* 0.65*   CBG:  Recent Labs  07/21/16 2136 07/22/16 0808 07/22/16 1248  GLUCAP 279* 199* 318*      Portable Chest X-ray (1 View)  Result Date: 07/11/2016 CLINICAL DATA:  Central line placement EXAM: PORTABLE CHEST 1 VIEW COMPARISON:  07/11/2016 FINDINGS: Multiple support leads obscure the chest. Insertion of left-sided central venous catheter, the tip is faintly visualized over the proximal right atrium. No pneumothorax. Low lung volumes without acute infiltrate. Mild subsegmental atelectasis at the left CP angle. Stable cardiomediastinal silhouette. Surgical clips in the right upper quadrant. IMPRESSION: Left-sided central venous catheter tip overlies the proximal right atrium. No pneumothorax. Electronically Signed   By: Donavan Foil M.D.   On: 07/11/2016 16:45   Dg Chest Port 1 View  Result Date: 07/11/2016 CLINICAL DATA:  Unresponsive, nausea and vomiting EXAM: PORTABLE CHEST 1 VIEW  COMPARISON:  06/05/2016 FINDINGS: Borderline cardiomegaly. Elevation of the right hemidiaphragm again noted. No infiltrate or pulmonary edema. Central mild vascular congestion. IMPRESSION: Cardiomegaly. Central mild vascular congestion without convincing pulmonary edema. No segmental infiltrate. Electronically Signed   By: [place] Crocker M.D.   On: 07/11/2016 15:34    ASSESSMENT/PLAN:  Diabetes mellitus, type II with nephropathy - discontinue Trulicity due to side-effects; continue Lantus100 units/mL 20 units subcutaneous daily at bedtime and NovoLog sliding scale subcutaneous 3 times a day; check CBG TID and HS Lab Results  Component Value Date   HGBA1C 8.8 (H) 07/11/2016   Bilateral lower extremity edema - apply TED hose, knee-high, on in a.m. and off at at bedtime  Hypokalemia - K 3.3 ; give KCL ER 20 meq 2 tabs = 40 meq PO X 1; check BMP      Durenda Age, NP [place]

## 2016-07-31 DIAGNOSIS — M6289 Other specified disorders of muscle: Secondary | ICD-10-CM | POA: Diagnosis not present

## 2016-07-31 DIAGNOSIS — K228 Other specified diseases of esophagus: Secondary | ICD-10-CM | POA: Diagnosis not present

## 2016-07-31 DIAGNOSIS — E1140 Type 2 diabetes mellitus with diabetic neuropathy, unspecified: Secondary | ICD-10-CM | POA: Diagnosis not present

## 2016-07-31 DIAGNOSIS — D62 Acute posthemorrhagic anemia: Secondary | ICD-10-CM | POA: Diagnosis not present

## 2016-07-31 DIAGNOSIS — M6281 Muscle weakness (generalized): Secondary | ICD-10-CM | POA: Diagnosis not present

## 2016-07-31 DIAGNOSIS — K2971 Gastritis, unspecified, with bleeding: Secondary | ICD-10-CM | POA: Diagnosis not present

## 2016-07-31 DIAGNOSIS — R2681 Unsteadiness on feet: Secondary | ICD-10-CM | POA: Diagnosis not present

## 2016-07-31 LAB — CBC AND DIFFERENTIAL
HCT: 30 % — AB (ref 36–46)
Hemoglobin: 9.5 g/dL — AB (ref 12.0–16.0)
Neutrophils Absolute: 9 /uL
Platelets: 269 10*3/uL (ref 150–399)
WBC: 13 10*3/mL

## 2016-08-01 ENCOUNTER — Encounter: Payer: Self-pay | Admitting: Adult Health

## 2016-08-01 ENCOUNTER — Non-Acute Institutional Stay (SKILLED_NURSING_FACILITY): Payer: PPO | Admitting: Adult Health

## 2016-08-01 DIAGNOSIS — J9601 Acute respiratory failure with hypoxia: Secondary | ICD-10-CM

## 2016-08-01 DIAGNOSIS — I509 Heart failure, unspecified: Secondary | ICD-10-CM | POA: Diagnosis not present

## 2016-08-01 DIAGNOSIS — J189 Pneumonia, unspecified organism: Secondary | ICD-10-CM

## 2016-08-01 NOTE — Progress Notes (Signed)
DATE:    08/01/16  MRN:  [id number]  BIRTHDAY: [birth year]  Facility:  Nursing Home Location:  [place] Room Number: 1206-P  LEVEL OF CARE:  SNF [phone])  Contact Information    Name Relation Home Work Mobile   [place]  [phone]     [place]   [id number]       Code Status History    Date Active Date Inactive Code Status Order ID Comments User Context   07/11/2016  4:16 PM 07/22/2016  6:39 PM Full Code [id number]  Erick Colace, NP ED   06/06/2016 12:26 AM 06/06/2016  9:06 PM DNR [id number]  Edwin Dada, MD Inpatient       Chief Complaint  Patient presents with  . Acute Visit    PNA, acute respiratory failure with hypoxia, fever, cough    HISTORY OF PRESENT ILLNESS:  This is a 76 year old female who has been having fever  100.9 F. She has been noted to be coughing productively. SOB with 90% O2 sat. WBC noted to be 13.0, elevated. Chest x-ray showed CHF and superimposed PNA.   She has been admitted to [facility] on 07/22/16 from [facility] admission 07/11/16 to 07/22/16 with hypovolemic shock with acute encephalopathy. She had DKA, viral gastroenteritis and NSTEMI. She had melena and GI was consulted. She had EGD done on 07/14/16 showing severe esophagitis with non-erosive gastropathy and hiatal hernia. She was placed on IV PPI and later switched to oral Protonix and Carafate. She had blood transfusion of PRBC.   PAST MEDICAL HISTORY:  Past Medical History:  Diagnosis Date  . Allergy   . Anemia associated with acute blood loss 06/2016  . Colon polyp   . Diabetes mellitus without complication ([place])   . Diverticulitis   . DKA (diabetic ketoacidoses) ([place]) 07/11/2016  . GERD (gastroesophageal reflux disease)   . Hematemesis 07/11/2016  . Hyperlipidemia   . Melena 06/2016  . UGIB (upper gastrointestinal bleed) 06/2016     CURRENT MEDICATIONS: Reviewed  Patient's Medications  New  Prescriptions   No medications on file  Previous Medications   ACETAMINOPHEN (TYLENOL) 325 MG TABLET    Take 650 mg by mouth every 4 (four) hours as needed for fever.   ACETAMINOPHEN (TYLENOL) 650 MG SUPPOSITORY    Place 650 mg rectally every 4 (four) hours as needed for fever.   ALBUTEROL (PROVENTIL) (2.5 MG/3ML) 0.083% NEBULIZER SOLUTION    Take 2.5 mg by nebulization. 6AM, 2PM, 10PM x1 week   CHOLECALCIFEROL (D3 SUPER STRENGTH) 2000 UNITS CAPS    Take 2,000 Units by mouth daily.    EZETIMIBE (ZETIA) 10 MG TABLET    Take 5 mg by mouth daily. Take 1/2 tablet to = 5 mg   INSULIN ASPART (NOVOLOG) 100 UNIT/ML INJECTION    Before each meal 3 times a day, 140-199 - 4 units, 200-250 - 6 units, 251-299 - 8 units,  300-349 - 12 units,  350 -400 16 units; >400 call MD. Dispense syringes and needles as needed, Ok to switch to PEN if approved.   INSULIN GLARGINE (LANTUS) 100 UNIT/ML INJECTION    Inject 20 Units into the skin at bedtime.   LISINOPRIL (PRINIVIL,ZESTRIL) 10 MG TABLET    Take 10 mg by mouth daily.   METOPROLOL TARTRATE (LOPRESSOR) 25 MG TABLET    Take 0.5 tablets (12.5 mg total) by mouth 2 (two) times daily.   MULTIPLE VITAMIN (MULTI VITAMIN DAILY)  TABS    Take 1 tablet by mouth daily.    PANTOPRAZOLE (PROTONIX) 40 MG TABLET    Take 1 tablet (40 mg total) by mouth 2 (two) times daily. Switch for any other PPI at similar dose and frequency   SIMVASTATIN (ZOCOR) 20 MG TABLET    Take 20 mg by mouth daily.    SUCRALFATE (CARAFATE) 1 GM/10ML SUSPENSION    Take 10 mLs (1 g total) by mouth every 6 (six) hours.  Modified Medications   No medications on file  Discontinued Medications   No medications on file     No Known Allergies   REVIEW OF SYSTEMS:  GENERAL: no change in appetite, no fatigue, no weight changes, no fever, chills or weakness EYES: Denies change in vision, dry eyes, eye pain, itching or discharge EARS: Denies change in hearing, ringing in ears, or earache NOSE: Denies nasal  congestion or epistaxis MOUTH and THROAT: Denies oral discomfort, gingival pain or bleeding, pain from teeth or hoarseness   RESPIRATORY: no cough, SOB, DOE, wheezing, hemoptysis CARDIAC: no chest pain,  or palpitations GI: no abdominal pain, diarrhea, constipation, heart burn, nausea or vomiting GU: Denies dysuria, frequency, hematuria, incontinence, or discharge PSYCHIATRIC: Denies feeling of depression or anxiety. No report of hallucinations, insomnia, paranoia, or agitation    PHYSICAL EXAMINATION  GENERAL APPEARANCE: Well nourished. In no acute distress. Normal body habitus SKIN:  Skin is warm and dry.  HEAD: Normal in size and contour. No evidence of trauma EYES: Lids open and close normally. No blepharitis, entropion or ectropion. PERRL. Conjunctivae are clear and sclerae are white. Lenses are without opacity EARS: Pinnae are normal. Patient hears normal voice tunes of the examiner MOUTH and THROAT: Lips are without lesions. Oral mucosa is moist and without lesions. Tongue is normal in shape, size, and color and without lesions NECK: supple, trachea midline, no neck masses, no thyroid tenderness, no thyromegaly LYMPHATICS: no LAN in the neck, no supraclavicular LAN RESPIRATORY: breathing is even & unlabored, has rales, on O2 @ 2L/min via [place] CARDIAC: RRR, no murmur,no extra heart sounds, BLE 1+ edema GI: abdomen soft, normal BS, no masses, no tenderness, no hepatomegaly, no splenomegaly EXTREMITIES:  Able to move X 4 extremities PSYCHIATRIC: Alert and oriented X 3. Affect and behavior are appropriate    LABS/RADIOLOGY: Labs reviewed: 07/28/16  WBC 6.4 hemoglobin 10.3 MCV 99.1 platelets 301 sodium 143  K3.3  glucose 155 BUN 5 creatinine 0.63 GFR>60 calcium 7.9 Basic Metabolic Panel:  Recent Labs  07/14/16 0350  07/14/16 2036  07/19/16 0447 07/20/16 0550 07/20/16 1700 07/21/16 0709 07/21/16 1841 07/22/16 0851 07/28/16  NA 148*  --   --   < > 138 139 134* 140 139  --  143   K 2.5*  < >  --   < > 3.3* 2.5* 3.5 2.7* 3.5  --  3.3*  CL 113*  --   --   < > 113* 109 107 111 110  --   --   CO2 26  --   --   < > 20* 22 19* 23 20*  --   --   GLUCOSE 202*  --   --   < > 128* 60* 336* 74 221*  --   --   BUN 25*  --   --   < > <5* <5* <5* <5* <5*  --  5  CREATININE 0.88  --   --   < > 0.62 0.68 0.76 0.68 0.84  --  0.6  CALCIUM 7.3*  --   --   < > 7.2* 7.4* 7.1* 7.3* 7.6*  --   --   MG 1.5*  --  2.4  --  1.3* 1.3*  --  1.8  --  1.6*  --   PHOS <1.0*  --  1.6*  --  2.2*  --   --   --   --   --   --   < > = values in this interval not displayed. Liver Function Tests:  Recent Labs  07/11/16 1511 07/20/16 0550 07/21/16 1841  AST 63* 31 40  ALT 29 23 23   ALKPHOS 85 84 92  BILITOT 1.2 0.5 0.3  PROT 5.9* 4.7* 5.1*  ALBUMIN 2.7* 1.6* 1.8*   CBC:  Recent Labs  07/11/16 1511  07/20/16 0550 07/21/16 0709 07/22/16 0851 07/28/16 07/31/16  WBC 23.4*  < > 12.5* 10.2 6.0 6.4 13.0  NEUTROABS 16.2*  --  8.6*  --   --   --  9  HGB 11.9*  < > 9.4* 8.7* 8.6* 10.3* 9.5*  HCT 40.3  < > 27.3* 26.1* 26.0* 32* 30*  MCV 110.1*  < > 91.9 93.9 94.2  --   --   PLT 268  < > 223 241 246 301 269  < > = values in this interval not displayed.  Lipid Panel:  Recent Labs  06/05/16 2338  HDL 43   Cardiac Enzymes:  Recent Labs  07/12/16 1243 07/12/16 1837 07/13/16 0025  TROPONINI 0.91* 0.82* 0.65*   CBG:  Recent Labs  07/21/16 2136 07/22/16 0808 07/22/16 1248  GLUCAP 279* 199* 318*      Portable Chest X-ray (1 View)  Result Date: 07/11/2016 CLINICAL DATA:  Central line placement EXAM: PORTABLE CHEST 1 VIEW COMPARISON:  07/11/2016 FINDINGS: Multiple support leads obscure the chest. Insertion of left-sided central venous catheter, the tip is faintly visualized over the proximal right atrium. No pneumothorax. Low lung volumes without acute infiltrate. Mild subsegmental atelectasis at the left CP angle. Stable cardiomediastinal silhouette. Surgical clips in the right  upper quadrant. IMPRESSION: Left-sided central venous catheter tip overlies the proximal right atrium. No pneumothorax. Electronically Signed   By: [place] Foil M.D.   On: 07/11/2016 16:45   Dg Chest Port 1 View  Result Date: 07/11/2016 CLINICAL DATA:  Unresponsive, nausea and vomiting EXAM: PORTABLE CHEST 1 VIEW COMPARISON:  06/05/2016 FINDINGS: Borderline cardiomegaly. Elevation of the right hemidiaphragm again noted. No infiltrate or pulmonary edema. Central mild vascular congestion. IMPRESSION: Cardiomegaly. Central mild vascular congestion without convincing pulmonary edema. No segmental infiltrate. Electronically Signed   By: [place] Crocker M.D.   On: 07/11/2016 15:34    ASSESSMENT/PLAN:  Acute respiratory failure with hypoxia - start Albuterol 2.5 mg/59ml 1 neb Q 6AM, 2PM and 10 PM X 1 week   CHF - start Lasix 20 mg 1 tab PO Q D X 3 days and KCL ER 10 meq 1 tab PO Q D X 3 days  HCAP - start Doxycycline 100 mg PO BID X 10 days and Florastor 250 mg 1 capsule PO BID X 13 days      Durenda Age, NP [employer] [phone]

## 2016-08-04 LAB — BASIC METABOLIC PANEL
BUN: 5 mg/dL (ref 4–21)
Creatinine: 0.5 mg/dL (ref 0.5–1.1)
Glucose: 180 mg/dL
POTASSIUM: 3.3 mmol/L — AB (ref 3.4–5.3)
SODIUM: 144 mmol/L (ref 137–147)

## 2016-08-07 LAB — BASIC METABOLIC PANEL
BUN: 5 mg/dL (ref 4–21)
Creatinine: 0.5 mg/dL (ref 0.5–1.1)
GLUCOSE: 191 mg/dL
Potassium: 3.5 mmol/L (ref 3.4–5.3)
Sodium: 144 mmol/L (ref 137–147)

## 2016-08-07 LAB — CBC AND DIFFERENTIAL
HEMATOCRIT: 29 % — AB (ref 36–46)
Hemoglobin: 8.9 g/dL — AB (ref 12.0–16.0)
PLATELETS: 328 10*3/uL (ref 150–399)
WBC: 5.7 10*3/mL

## 2016-08-19 ENCOUNTER — Encounter: Payer: PPO | Admitting: Gastroenterology

## 2016-08-20 ENCOUNTER — Encounter: Payer: Self-pay | Admitting: Adult Health

## 2016-08-20 ENCOUNTER — Non-Acute Institutional Stay (SKILLED_NURSING_FACILITY): Payer: PPO | Admitting: Adult Health

## 2016-08-20 DIAGNOSIS — Z794 Long term (current) use of insulin: Secondary | ICD-10-CM

## 2016-08-20 DIAGNOSIS — I10 Essential (primary) hypertension: Secondary | ICD-10-CM | POA: Diagnosis not present

## 2016-08-20 DIAGNOSIS — R531 Weakness: Secondary | ICD-10-CM | POA: Diagnosis not present

## 2016-08-20 DIAGNOSIS — G451 Carotid artery syndrome (hemispheric): Secondary | ICD-10-CM | POA: Diagnosis not present

## 2016-08-20 DIAGNOSIS — E1121 Type 2 diabetes mellitus with diabetic nephropathy: Secondary | ICD-10-CM

## 2016-08-20 DIAGNOSIS — D62 Acute posthemorrhagic anemia: Secondary | ICD-10-CM

## 2016-08-20 DIAGNOSIS — K209 Esophagitis, unspecified without bleeding: Secondary | ICD-10-CM

## 2016-08-20 NOTE — Progress Notes (Signed)
DATE:  08/20/2016   MRN:  [id number]  BIRTHDAY: August 23, 1940  Facility:  Nursing Home Location:  [place] Room Number: 1206-P  LEVEL OF CARE:  SNF [phone])  Contact Information    Name Relation Home Work Mobile   [place]  [phone]     [place]   [id number]       Code Status History    Date Active Date Inactive Code Status Order ID Comments User Context   07/11/2016  4:16 PM 07/22/2016  6:39 PM Full Code [id number]  Erick Colace, NP ED   06/06/2016 12:26 AM 06/06/2016  9:06 PM DNR [id number]  Edwin Dada, MD Inpatient       Chief Complaint  Patient presents with  . Discharge Note    HISTORY OF PRESENT ILLNESS:  This is a 59-YO female seen for a discharge visit.  She will discharge to home on on 08/22/2016 with home health PT and OT, CNA, and Nursing services.    She will have DME of a rolling walker.    She has been admitted to [facility] on 07/22/16 from [facility] admission 07/11/16 to 07/22/16 with hypovolemic shock with acute encephalopathy. She had DKA, viral gastroenteritis and NSTEMI. She had melena and GI was consulted. She had EGD done on 07/14/16 showing severe esophagitis with non-erosive gastropathy and hiatal hernia. She was placed on IV PPI and later switched to oral Protonix and Carafate. She had blood transfusion of PRBC.  Patient was admitted to this facility for short-term rehabilitation after the patient's recent hospitalization.  Patient has completed SNF rehabilitation and therapy has cleared the patient for discharge.    PAST MEDICAL HISTORY:  Past Medical History:  Diagnosis Date  . Allergy   . Anemia associated with acute blood loss 06/2016  . Colon polyp   . Diabetes mellitus without complication ([place])   . Diverticulitis   . DKA (diabetic ketoacidoses) ([place]) 07/11/2016  . GERD (gastroesophageal reflux disease)   . Hematemesis 07/11/2016  . Hyperlipidemia   .  Melena 06/2016  . UGIB (upper gastrointestinal bleed) 06/2016     CURRENT MEDICATIONS: Reviewed  Patient's Medications  New Prescriptions   No medications on file  Previous Medications   ACETAMINOPHEN (TYLENOL) 325 MG TABLET    Take 650 mg by mouth every 4 (four) hours as needed for fever.   ACETAMINOPHEN (TYLENOL) 650 MG SUPPOSITORY    Place 650 mg rectally every 4 (four) hours as needed for fever.   CHOLECALCIFEROL (D3 SUPER STRENGTH) 2000 UNITS CAPS    Take 2,000 Units by mouth daily.    EZETIMIBE (ZETIA) 10 MG TABLET    Take 5 mg by mouth daily. Take 1/2 tablet to = 5 mg   INSULIN ASPART (NOVOLOG) 100 UNIT/ML INJECTION    Before each meal 3 times a day, 140-199 - 4 units, 200-250 - 6 units, 251-299 - 8 units,  300-349 - 12 units,  350 -400 16 units; >400 call MD. Dispense syringes and needles as needed, Ok to switch to PEN if approved.   INSULIN GLARGINE (LANTUS) 100 UNIT/ML INJECTION    Inject 20 Units into the skin at bedtime.   LISINOPRIL (PRINIVIL,ZESTRIL) 10 MG TABLET    Take 10 mg by mouth daily.    METOPROLOL TARTRATE (LOPRESSOR) 25 MG TABLET    Take 0.5 tablets (12.5 mg total) by mouth 2 (two) times daily.   MULTIPLE VITAMIN (MULTI VITAMIN DAILY) TABS  Take 1 tablet by mouth daily.    PANTOPRAZOLE (PROTONIX) 40 MG TABLET    Take 1 tablet (40 mg total) by mouth 2 (two) times daily. Switch for any other PPI at similar dose and frequency   SIMVASTATIN (ZOCOR) 20 MG TABLET    Take 20 mg by mouth daily.    SUCRALFATE (CARAFATE) 1 GM/10ML SUSPENSION    Take 10 mLs (1 g total) by mouth every 6 (six) hours.  Modified Medications   No medications on file  Discontinued Medications   ALBUTEROL (PROVENTIL) (2.5 MG/3ML) 0.083% NEBULIZER SOLUTION    Take 2.5 mg by nebulization. 6AM, 2PM, 10PM x1 week     No Known Allergies   REVIEW OF SYSTEMS:  GENERAL: no change in appetite, no fatigue, no weight changes, no fever, chills or weakness EYES: Denies change in vision, dry eyes, eye  pain, itching or discharge EARS: Denies change in hearing, ringing in ears, or earache NOSE: Denies nasal congestion or epistaxis MOUTH and THROAT: Denies oral discomfort, gingival pain or bleeding, pain from teeth or hoarseness   RESPIRATORY: no cough, SOB, DOE, wheezing, hemoptysis CARDIAC: no chest pain, edema or palpitations GI: no abdominal pain, diarrhea, constipation, heart burn, nausea or vomiting GU: Denies dysuria, frequency, hematuria, incontinence, or discharge PSYCHIATRIC: Denies feeling of depression or anxiety. No report of hallucinations, insomnia, paranoia, or agitation   PHYSICAL EXAMINATION  GENERAL APPEARANCE: Well nourished. In no acute distress. Normal body habitus SKIN:  Skin is warm and dry HEAD: Normal in size and contour. No evidence of trauma EYES: Lids open and close normally. No blepharitis, entropion or ectropion. PERRL. Conjunctivae are clear and sclerae are white. Lenses are without opacity EARS: Pinnae are normal. Patient hears normal voice tunes of the examiner MOUTH and THROAT: Lips are without lesions. Oral mucosa is moist and without lesions. Tongue is normal in shape, size, and color and without lesions NECK: supple, trachea midline, no neck masses, no thyroid tenderness, no thyromegaly LYMPHATICS: no LAN in the neck, no supraclavicular LAN RESPIRATORY: breathing is even & unlabored, BS CTAB CARDIAC: RRR, no murmur,no extra heart sounds, BLE 1+ edema GI: abdomen soft, normal BS, no masses, no tenderness, no hepatomegaly, no splenomegaly EXTREMITIES:  Able to move X 4 extremities PSYCHIATRIC: Alert and oriented X 3. Affect and behavior are appropriate  LABS/RADIOLOGY: Labs reviewed: Basic Metabolic Panel:  Recent Labs  07/14/16 0350  07/14/16 2036  07/19/16 0447 07/20/16 0550 07/20/16 1700 07/21/16 0709 07/21/16 1841 07/22/16 0851 07/28/16 08/04/16 08/07/16  NA 148*  --   --   < > 138 139 134* 140 139  --  143 144 144  K 2.5*  < >  --   <  > 3.3* 2.5* 3.5 2.7* 3.5  --  3.3* 3.3* 3.5  CL 113*  --   --   < > 113* 109 107 111 110  --   --   --   --   CO2 26  --   --   < > 20* 22 19* 23 20*  --   --   --   --   GLUCOSE 202*  --   --   < > 128* 60* 336* 74 221*  --   --   --   --   BUN 25*  --   --   < > <5* <5* <5* <5* <5*  --  5 5 5   CREATININE 0.88  --   --   < > 0.62  0.68 0.76 0.68 0.84  --  0.6 0.5 0.5  CALCIUM 7.3*  --   --   < > 7.2* 7.4* 7.1* 7.3* 7.6*  --   --   --   --   MG 1.5*  --  2.4  --  1.3* 1.3*  --  1.8  --  1.6*  --   --   --   PHOS <1.0*  --  1.6*  --  2.2*  --   --   --   --   --   --   --   --   < > = values in this interval not displayed.  Liver Function Tests:  Recent Labs  07/11/16 1511 07/20/16 0550 07/21/16 1841  AST 63* 31 40  ALT 29 23 23   ALKPHOS 85 84 92  BILITOT 1.2 0.5 0.3  PROT 5.9* 4.7* 5.1*  ALBUMIN 2.7* 1.6* 1.8*   CBC:  Recent Labs  07/11/16 1511  07/20/16 0550 07/21/16 0709 07/22/16 0851 07/28/16 07/31/16 08/07/16  WBC 23.4*  < > 12.5* 10.2 6.0 6.4 13.0 5.7  NEUTROABS 16.2*  --  8.6*  --   --   --  9  --   HGB 11.9*  < > 9.4* 8.7* 8.6* 10.3* 9.5* 8.9*  HCT 40.3  < > 27.3* 26.1* 26.0* 32* 30* 29*  MCV 110.1*  < > 91.9 93.9 94.2  --   --   --   PLT 268  < > 223 241 246 301 269 328  < > = values in this interval not displayed.  Lipid Panel:  Recent Labs  06/05/16 2338  HDL 43   Cardiac Enzymes:  Recent Labs  07/12/16 1243 07/12/16 1837 07/13/16 0025  TROPONINI 0.91* 0.82* 0.65*   CBG:  Recent Labs  07/21/16 2136 07/22/16 0808 07/22/16 1248  GLUCAP 279* 199* 318*      ASSESSMENT/PLAN:  Generalized weakness -  For home health PT and OT, for therapeutic strengthening exercises; fall precautions  Severe esophagitis - no complaints of indigestion, heartburn nor nausea and vomiting; continue Carafate 10 mL every 6 hours; follow-up with GI; continue Protonix 40 mg 1 tab by mouth twice a day  Insulin-dependent diabetes mellitus - continue Lantus 100  units/mL give 20 units subcutaneous daily at bedtime and NovoLog sliding scale subcutaneous 3 times a day Lab Results  Component Value Date   HGBA1C 8.8 (H) 07/11/2016   Anemia, acute blood loss - S/P transfusion of 1 unit packed RBC on 07/19/16, stable Lab Results  Component Value Date   HGB 8.9 (A) 08/07/2016   Hypertension - well controlled; continue metoprolol succinate ER 25 mg 1/2 tab = 12.5 mg by mouth twice a day  CAD - stable; currently aspirin and Plavix has been on hold due to recent GI bleed; continue Zocor, lisinopril and metoprolol     I have filled out patient's discharge paperwork and written prescriptions.  Patient will receive home health PT, OT, Nursing and CNA.  DME provided:  Rolling walker  Total discharge time: Greater than 30 minutes  Discharge time involved coordination of the discharge process with Education officer, museum, nursing staff and therapy department. Medical justification for home health services/DME verified.    Monina C. Morenci - NP [employer] [phone]

## 2016-09-01 DIAGNOSIS — R4182 Altered mental status, unspecified: Secondary | ICD-10-CM | POA: Diagnosis not present

## 2016-09-01 DIAGNOSIS — R634 Abnormal weight loss: Secondary | ICD-10-CM | POA: Diagnosis not present

## 2016-09-01 DIAGNOSIS — Z794 Long term (current) use of insulin: Secondary | ICD-10-CM | POA: Diagnosis not present

## 2016-09-01 DIAGNOSIS — K228 Other specified diseases of esophagus: Secondary | ICD-10-CM | POA: Diagnosis not present

## 2016-09-01 DIAGNOSIS — E119 Type 2 diabetes mellitus without complications: Secondary | ICD-10-CM | POA: Diagnosis not present

## 2016-09-01 DIAGNOSIS — D649 Anemia, unspecified: Secondary | ICD-10-CM | POA: Diagnosis not present

## 2016-09-02 ENCOUNTER — Encounter (INDEPENDENT_AMBULATORY_CARE_PROVIDER_SITE_OTHER): Payer: Self-pay

## 2016-09-02 ENCOUNTER — Encounter: Payer: Self-pay | Admitting: Neurology

## 2016-09-02 ENCOUNTER — Ambulatory Visit (INDEPENDENT_AMBULATORY_CARE_PROVIDER_SITE_OTHER): Payer: PPO | Admitting: Neurology

## 2016-09-02 VITALS — BP 151/81 | HR 80 | Ht 62.0 in | Wt 144.4 lb

## 2016-09-02 DIAGNOSIS — G9340 Encephalopathy, unspecified: Secondary | ICD-10-CM

## 2016-09-02 DIAGNOSIS — K228 Other specified diseases of esophagus: Secondary | ICD-10-CM

## 2016-09-02 DIAGNOSIS — E1159 Type 2 diabetes mellitus with other circulatory complications: Secondary | ICD-10-CM

## 2016-09-02 DIAGNOSIS — Z794 Long term (current) use of insulin: Secondary | ICD-10-CM | POA: Diagnosis not present

## 2016-09-02 DIAGNOSIS — E785 Hyperlipidemia, unspecified: Secondary | ICD-10-CM | POA: Diagnosis not present

## 2016-09-02 DIAGNOSIS — E119 Type 2 diabetes mellitus without complications: Secondary | ICD-10-CM | POA: Insufficient documentation

## 2016-09-02 DIAGNOSIS — D649 Anemia, unspecified: Secondary | ICD-10-CM | POA: Diagnosis not present

## 2016-09-02 DIAGNOSIS — G451 Carotid artery syndrome (hemispheric): Secondary | ICD-10-CM | POA: Insufficient documentation

## 2016-09-02 DIAGNOSIS — K2289 Other specified disease of esophagus: Secondary | ICD-10-CM

## 2016-09-02 NOTE — Patient Instructions (Addendum)
-   recommend ASA EC 81mg  for stroke prevention - continue Zocor for stroke prevention - recommend 30 day cardiac event monitoring to rule out afib - Follow up with your primary care physician for stroke risk factor modification. Recommend maintain blood pressure goal <130/80, diabetes with hemoglobin A1c goal below 7.0% and lipids with LDL cholesterol goal below 70 mg/dL.  - continue PT/OT at [facility] to improve strength - check BP and glucose at [facility] daily and record - follow up in 3-4 months.

## 2016-09-02 NOTE — Progress Notes (Signed)
STROKE NEUROLOGY FOLLOW UP NOTE  NAME: Katie Lozano DOB: 1941-05-20  REASON FOR VISIT: stroke follow up HISTORY FROM: sister and chart  Today we had the pleasure of seeing Katie Lozano in follow-up at our Neurology Clinic. Pt was accompanied by sister.   History Summary Katie Lozano is a 76 y.o. female with history of TIAs, HLD and DM was admitted on 06/05/16 for transient left arm weakness and slurred speech. MRI, MRA head, CUS negative. TTE was pending result from OSH at that time. LDL 48 and A1C 8.2. Her symptoms considered left brain TIA. She did have an aphasia episode in 08/2015, so 30 day cardiac event monitoring recommended as outpt. Her ASA changed to plavix and continued zocor and zetia on discharge to home.  However, she was readmitted on 07/11/16 due to DKA, hypovolemic shock, respiratory failure, ischemic esophageal necrosis, anemia required transfusion, as well as possible NSTEMI. Her plavix discontinued. After stabilization, she was discharged to SNF.   Interval History During the interval time, the patient has been doing better in SNF. As per sister, today she is much improved from the last 3 days when she was very encephalopathic and confused. She is on Zocor but no antiplatelet yet. Her Hb yesterday was 12.8, sister denies any bleeding. BP today 151/81. There is no paper work from [employer].     REVIEW OF SYSTEMS: Full 14 system review of systems performed and notable only for those listed below and in HPI above, all others are negative:  Constitutional:   Cardiovascular:  Ear/Nose/Throat:   Skin:  Eyes:   Respiratory:   Gastroitestinal:   Genitourinary:  Hematology/Lymphatic:   Endocrine:  Musculoskeletal:   Allergy/Immunology:   Neurological:  Memory loss, confusion Psychiatric:  Sleep:   The following represents the patient's updated allergies and side effects list: No Known Allergies  The neurologically relevant items on the patient's problem list were  reviewed on today's visit.  Neurologic Examination  A problem focused neurological exam (12 or more points of the single system neurologic examination, vital signs counts as 1 point, cranial nerves count for 8 points) was performed.  Blood pressure (!) 151/81, pulse 80, height 5\' 2"  (1.575 m), weight 144 lb 6.4 oz (65.5 kg).  General - Well nourished, well developed, in no apparent distress.  Ophthalmologic - Fundi not visualized due to noncooperation.  Cardiovascular - Regular rate and rhythm with no murmur.  Mental Status -  Level of arousal and orientation to year, place, and person were intact, but not to month. Language including expression, naming, repetition, comprehension was assessed and found intact.  Cranial Nerves II - XII - II - Visual field intact OU. III, IV, VI - Extraocular movements intact. V - Facial sensation intact bilaterally. VII - Facial movement intact bilaterally. VIII - Hearing & vestibular intact bilaterally. X - Palate elevates symmetrically. XI - Chin turning & shoulder shrug intact bilaterally. XII - Tongue protrusion intact.  Motor Strength - The patient's strength was normal in all extremities and pronator drift was absent.  Bulk was normal and fasciculations were absent.   Motor Tone - Muscle tone was assessed at the neck and appendages and was normal.  Reflexes - The patient's reflexes were 1+ in all extremities and she had no pathological reflexes.  Sensory - Light touch, temperature/pinprick were assessed and were normal.    Coordination - The patient had normal movements in the hands and feet with no ataxia or dysmetria.  Tremor was  absent.  Gait and Station - walk with walker, stable, slow stride.   Functional score  mRS = 3   0 - No symptoms.   1 - No significant disability. Able to carry out all usual activities, despite some symptoms.   2 - Slight disability. Able to look after own affairs without assistance, but unable to  carry out all previous activities.   3 - Moderate disability. Requires some help, but able to walk unassisted.   4 - Moderately severe disability. Unable to attend to own bodily needs without assistance, and unable to walk unassisted.   5 - Severe disability. Requires constant nursing care and attention, bedridden, incontinent.   6 - Dead.   NIH Stroke Scale   Level Of Consciousness 0=Alert; keenly responsive 1=Not alert, but arousable by minor stimulation 2=Not alert, requires repeated stimulation 3=Responds only with reflex movements 0  LOC Questions to Month and Age 59=Answers both questions correctly 1=Answers one question correctly 2=Answers neither question correctly 1  LOC Commands      -Open/Close eyes     -Open/close grip 0=Performs both tasks correctly 1=Performs one task correctly 2=Performs neighter task correctly 0  Best Gaze 0=Normal 1=Partial gaze palsy 2=Forced deviation, or total gaze paresis 0  Visual 0=No visual loss 1=Partial hemianopia 2=Complete hemianopia 3=Bilateral hemianopia (blind including cortical blindness) 0  Facial Palsy 0=Normal symmetrical movement 1=Minor paralysis (asymmetry) 2=Partial paralysis (lower face) 3=Complete paralysis (upper and lower face) 0  Motor  0=No drift, limb holds posture for full 10 seconds 1=Drift, limb holds posture, no drift to bed 2=Some antigravity effort, cannot maintain posture, drifts to bed 3=No effort against gravity, limb falls 4=No movement Right Arm 0     Leg 0    Left Arm 0     Leg 0  Limb Ataxia 0=Absent 1=Present in one limb 2=Present in two limbs 0  Sensory 0=Normal 1=Mild to moderate sensory loss 2=Severe to total sensory loss 0  Best Language 0=No aphasia, normal 1=Mild to moderate aphasia 2=Mute, global aphasia 3=Mute, global aphasia 0  Dysarthria 0=Normal 1=Mild to moderate 2=Severe, unintelligible or mute/anarthric 0  Extinction/Neglect 0=No abnormality 1=Extinction to bilateral  simultaneous stimulation 2=Profound neglect 0  Total   1     Data reviewed: I personally reviewed the images and agree with the radiology interpretations.  Ct Head Wo Contrast 06/05/2016 Negative CT HEAD for age. Chronic RIGHT paranasal sinusitis.   Mr Jodene Nam Head/brain Wo Cm 06/06/2016 MRI HEAD:  No acute intracranial process, specifically no acute ischemia. Involutional changes and mild to moderate chronic small vessel ischemic disease. Suspected RIGHT neck lymphadenopathy, or recommend correlation with physical examination.   MRA HEAD:  Negative.   CUS - Bilateral 1-39% ICA stenosis, antegrade vertebral flow bilateral. Difficult evaluation of left proximal internal carotid artery due to acoustic shadowing.  Component     Latest Ref Rng & Units 06/05/2016  Cholesterol     0 - 200 mg/dL 161  Triglycerides     <150 mg/dL 350 (H)  HDL Cholesterol     >40 mg/dL 43  Total CHOL/HDL Ratio     RATIO 3.7  VLDL     0 - 40 mg/dL 70 (H)  LDL (calc)     0 - 99 mg/dL 48  Hemoglobin A1C     4.8 - 5.6 % 8.2 (H)  Mean Plasma Glucose     mg/dL 189    Assessment: As you may recall, she is a 76 y.o. Caucasian female with PMH  of TIAs, HLD and DM was admitted on 06/05/16 for transient left arm weakness and slurred speech. MRI, MRA head, CUS negative. TTE was pending result from OSH at that time. LDL 48 and A1C 8.2. Her symptoms considered left brain TIA. She did have an aphasia episode in 08/2015, so 30 day cardiac event monitoring recommended as outpt. Her ASA changed to plavix and continued zocor and zetia on discharge to home.  However, she was readmitted on 07/11/16 due to DKA, hypovolemic shock, respiratory failure, ischemic esophageal necrosis, anemia required transfusion, as well as possible NSTEMI. Her plavix discontinued. After stabilization, she was discharged to SNF. Currently, in SNF with improving encephalopathy. Not on antiplatelet yet. Her Hb 09/01/16 was 12.8, sister denies any  bleeding. Will resume ASA 81mg . Consider 30 day cardiac monitoring if able to be done in SNF, otherwise, after SNF discharge.  Plan:  - recommend ASA EC 81mg  for stroke prevention - continue Zocor for stroke prevention - recommend 30 day cardiac event monitoring to rule out afib - Follow up with your primary care physician for stroke risk factor modification. Recommend maintain blood pressure goal <130/80, diabetes with hemoglobin A1c goal below 7.0% and lipids with LDL cholesterol goal below 70 mg/dL.  - continue PT/OT at SNF to improve strength - check BP and glucose at [facility] daily and record - follow up in 3-4 months.   I spent more than 25 minutes of face to face time with the patient. Greater than 50% of time was spent in counseling and coordination of care. We discussed resume ASA, bleeding precautions, cardiac monitoring and aggressive PT/OT.    No orders of the defined types were placed in this encounter.   Meds ordered this encounter  Medications  . DISCONTD: acetaminophen (TYLENOL) 325 MG tablet    Sig: Take 650 mg by mouth.  . insulin aspart (NOVOLOG) 100 UNIT/ML injection    Sig: Inject into the skin.  Marland Kitchen DISCONTD: ezetimibe (ZETIA) 10 MG tablet    Sig: Take 5 mg by mouth.  . DISCONTD: lisinopril (PRINIVIL,ZESTRIL) 10 MG tablet    Sig: Take 10 mg by mouth.  . metoprolol succinate (TOPROL-XL) 25 MG 24 hr tablet  . DISCONTD: pantoprazole (PROTONIX) 40 MG tablet    Sig: Take 40 mg by mouth.  . sucralfate (CARAFATE) 1 GM/10ML suspension    Sig: Take by mouth.  . Cholecalciferol (VITAMIN D3) 2000 units capsule    Sig: Take by mouth.    Patient Instructions  - recommend ASA EC 81mg  for stroke prevention - continue Zocor for stroke prevention - recommend 30 day cardiac event monitoring to rule out afib - Follow up with your primary care physician for stroke risk factor modification. Recommend maintain blood pressure goal <130/80, diabetes with hemoglobin A1c goal below  7.0% and lipids with LDL cholesterol goal below 70 mg/dL.  - continue PT/OT at [facility] to improve strength - check BP and glucose at [facility] daily and record - follow up in 3-4 months.    Rosalin Hawking, MD PhD [facility] [address] [place], [place] [address] [phone]

## 2016-09-09 ENCOUNTER — Telehealth: Payer: Self-pay | Admitting: *Deleted

## 2016-09-09 NOTE — Telephone Encounter (Signed)
Elizabeth with [employer] called with questions regarding Insulin.  Informed her that this is not our patient to call PCP. Agreed.

## 2016-09-10 ENCOUNTER — Telehealth: Payer: Self-pay

## 2016-09-10 ENCOUNTER — Other Ambulatory Visit: Payer: Self-pay

## 2016-09-10 DIAGNOSIS — K922 Gastrointestinal hemorrhage, unspecified: Secondary | ICD-10-CM

## 2016-09-10 NOTE — Telephone Encounter (Signed)
Patient is scheduled for 5/15 at [facility], spoke to sister. Patient not on Plavix. See other note.

## 2016-09-10 NOTE — Telephone Encounter (Signed)
-----   Message from Manus Gunning, MD sent at 07/28/2016 11:58 AM EST ----- Thanks for the update, to clarify, patient is able to come off plavix on 3/20? We will need to look at the schedule and see when to get her in. If she isn't having dysphagia likely okay to wait, if we can avoid holding plavix that would be best. Thanks    ----- Message ----- From: Doristine Counter, RN Sent: 07/28/2016  11:18 AM To: Manus Gunning, MD  Dr. Loni Muse- This patient was discharged to a SNF, she is on oxygen. Spoke to her nurse today, patient desaturates very quickly when off of her O2. She will not be able to have a repeat EGD at [facility]. Your next available hospital day is either this Friday (2/2) but patient is on plavix or 3/20. What do you suggest? Thank you, Almyra Free ----- Message ----- From: Manus Gunning, MD Sent: 07/18/2016   8:14 AM To: Doristine Counter, RN  Almyra Free this patient is being discharged likely over the weekend. She will need a follow up EGD with me in about a month if you can coordinate sometime next week. Thanks

## 2016-09-10 NOTE — Telephone Encounter (Signed)
Spoke to patient's sister, Bennie Pierini, who is now her HCPOA. Patient has been discharged from [facility] after completing her rehab stay. She is now currently at [facility], sister is not sure how long she will be able to stay there. I let her know that patient is scheduled for a follow up EGD to check on healing of her upper GI bleed. Patient is currently not on Plavix, but was allowed to start ASA 81 mg EC one tablet daily.  Sister states that patient has good days and bad due to recent stroke, but wanted to proceed with the EGD. I have mailed prep instruction to sister at: [address] [place], [place] [id number].

## 2016-09-14 ENCOUNTER — Observation Stay (HOSPITAL_COMMUNITY): Payer: PPO

## 2016-09-14 ENCOUNTER — Encounter (HOSPITAL_BASED_OUTPATIENT_CLINIC_OR_DEPARTMENT_OTHER): Payer: Self-pay | Admitting: Emergency Medicine

## 2016-09-14 ENCOUNTER — Inpatient Hospital Stay (HOSPITAL_BASED_OUTPATIENT_CLINIC_OR_DEPARTMENT_OTHER)
Admission: EM | Admit: 2016-09-14 | Discharge: 2016-09-18 | DRG: 091 | Disposition: A | Discharge status: Skilled Nursing Facility | Payer: PPO | Attending: Internal Medicine | Admitting: Internal Medicine

## 2016-09-14 ENCOUNTER — Emergency Department (HOSPITAL_BASED_OUTPATIENT_CLINIC_OR_DEPARTMENT_OTHER): Payer: PPO

## 2016-09-14 DIAGNOSIS — Z8371 Family history of colonic polyps: Secondary | ICD-10-CM

## 2016-09-14 DIAGNOSIS — I639 Cerebral infarction, unspecified: Secondary | ICD-10-CM | POA: Diagnosis not present

## 2016-09-14 DIAGNOSIS — F0390 Unspecified dementia without behavioral disturbance: Secondary | ICD-10-CM | POA: Diagnosis not present

## 2016-09-14 DIAGNOSIS — Z794 Long term (current) use of insulin: Secondary | ICD-10-CM | POA: Diagnosis not present

## 2016-09-14 DIAGNOSIS — E876 Hypokalemia: Secondary | ICD-10-CM | POA: Diagnosis not present

## 2016-09-14 DIAGNOSIS — E785 Hyperlipidemia, unspecified: Secondary | ICD-10-CM | POA: Diagnosis not present

## 2016-09-14 DIAGNOSIS — K209 Esophagitis, unspecified: Secondary | ICD-10-CM | POA: Diagnosis not present

## 2016-09-14 DIAGNOSIS — R4182 Altered mental status, unspecified: Secondary | ICD-10-CM

## 2016-09-14 DIAGNOSIS — I6310 Cerebral infarction due to embolism of unspecified precerebral artery: Secondary | ICD-10-CM | POA: Diagnosis not present

## 2016-09-14 DIAGNOSIS — Z6824 Body mass index (BMI) 24.0-24.9, adult: Secondary | ICD-10-CM

## 2016-09-14 DIAGNOSIS — Z0389 Encounter for observation for other suspected diseases and conditions ruled out: Secondary | ICD-10-CM | POA: Diagnosis not present

## 2016-09-14 DIAGNOSIS — Z8673 Personal history of transient ischemic attack (TIA), and cerebral infarction without residual deficits: Secondary | ICD-10-CM

## 2016-09-14 DIAGNOSIS — Z800 Family history of malignant neoplasm of digestive organs: Secondary | ICD-10-CM | POA: Diagnosis not present

## 2016-09-14 DIAGNOSIS — Z9049 Acquired absence of other specified parts of digestive tract: Secondary | ICD-10-CM

## 2016-09-14 DIAGNOSIS — Z79899 Other long term (current) drug therapy: Secondary | ICD-10-CM

## 2016-09-14 DIAGNOSIS — E1165 Type 2 diabetes mellitus with hyperglycemia: Secondary | ICD-10-CM | POA: Diagnosis not present

## 2016-09-14 DIAGNOSIS — K228 Other specified diseases of esophagus: Secondary | ICD-10-CM

## 2016-09-14 DIAGNOSIS — Z8249 Family history of ischemic heart disease and other diseases of the circulatory system: Secondary | ICD-10-CM | POA: Diagnosis not present

## 2016-09-14 DIAGNOSIS — E1121 Type 2 diabetes mellitus with diabetic nephropathy: Secondary | ICD-10-CM

## 2016-09-14 DIAGNOSIS — R4702 Dysphasia: Secondary | ICD-10-CM

## 2016-09-14 DIAGNOSIS — R9401 Abnormal electroencephalogram [EEG]: Secondary | ICD-10-CM | POA: Diagnosis present

## 2016-09-14 DIAGNOSIS — Z87891 Personal history of nicotine dependence: Secondary | ICD-10-CM | POA: Diagnosis not present

## 2016-09-14 DIAGNOSIS — F0150 Vascular dementia without behavioral disturbance: Secondary | ICD-10-CM | POA: Diagnosis not present

## 2016-09-14 DIAGNOSIS — I447 Left bundle-branch block, unspecified: Secondary | ICD-10-CM | POA: Diagnosis present

## 2016-09-14 DIAGNOSIS — R410 Disorientation, unspecified: Secondary | ICD-10-CM | POA: Diagnosis not present

## 2016-09-14 DIAGNOSIS — G9340 Encephalopathy, unspecified: Secondary | ICD-10-CM | POA: Diagnosis present

## 2016-09-14 DIAGNOSIS — E43 Unspecified severe protein-calorie malnutrition: Secondary | ICD-10-CM | POA: Insufficient documentation

## 2016-09-14 DIAGNOSIS — G92 Toxic encephalopathy: Secondary | ICD-10-CM | POA: Diagnosis not present

## 2016-09-14 DIAGNOSIS — E860 Dehydration: Secondary | ICD-10-CM | POA: Diagnosis present

## 2016-09-14 DIAGNOSIS — R29704 NIHSS score 4: Secondary | ICD-10-CM | POA: Diagnosis present

## 2016-09-14 DIAGNOSIS — K2289 Other specified disease of esophagus: Secondary | ICD-10-CM

## 2016-09-14 DIAGNOSIS — Z86010 Personal history of colonic polyps: Secondary | ICD-10-CM

## 2016-09-14 DIAGNOSIS — I252 Old myocardial infarction: Secondary | ICD-10-CM | POA: Diagnosis not present

## 2016-09-14 DIAGNOSIS — Z833 Family history of diabetes mellitus: Secondary | ICD-10-CM

## 2016-09-14 DIAGNOSIS — I10 Essential (primary) hypertension: Secondary | ICD-10-CM | POA: Diagnosis present

## 2016-09-14 DIAGNOSIS — K219 Gastro-esophageal reflux disease without esophagitis: Secondary | ICD-10-CM | POA: Diagnosis present

## 2016-09-14 DIAGNOSIS — I6340 Cerebral infarction due to embolism of unspecified cerebral artery: Secondary | ICD-10-CM | POA: Insufficient documentation

## 2016-09-14 DIAGNOSIS — Z823 Family history of stroke: Secondary | ICD-10-CM | POA: Diagnosis not present

## 2016-09-14 LAB — BASIC METABOLIC PANEL
Anion gap: 9 (ref 5–15)
BUN: 10 mg/dL (ref 6–20)
CALCIUM: 9.1 mg/dL (ref 8.9–10.3)
CHLORIDE: 104 mmol/L (ref 101–111)
CO2: 23 mmol/L (ref 22–32)
CREATININE: 0.76 mg/dL (ref 0.44–1.00)
GFR calc non Af Amer: >60 mL/min (ref >60)
Glucose, Bld: 414 mg/dL — ABNORMAL HIGH (ref 65–99)
Potassium: 3.8 mmol/L (ref 3.5–5.1)
SODIUM: 136 mmol/L (ref 135–145)

## 2016-09-14 LAB — HEPATIC FUNCTION PANEL
ALK PHOS: 66 U/L (ref 38–126)
ALT: 19 U/L (ref 14–54)
AST: 28 U/L (ref 15–41)
Albumin: 3.6 g/dL (ref 3.5–5.0)
BILIRUBIN INDIRECT: 0.7 mg/dL (ref 0.3–0.9)
BILIRUBIN TOTAL: 0.9 mg/dL (ref 0.3–1.2)
Bilirubin, Direct: 0.2 mg/dL (ref 0.1–0.5)
Total Protein: 7 g/dL (ref 6.5–8.1)

## 2016-09-14 LAB — URINALYSIS, ROUTINE W REFLEX MICROSCOPIC
BILIRUBIN URINE: NEGATIVE
Glucose, UA: >=500 mg/dL — AB
KETONES UR: 15 mg/dL — AB
Leukocytes, UA: NEGATIVE
Nitrite: NEGATIVE
PROTEIN: NEGATIVE mg/dL
Specific Gravity, Urine: 1.027 (ref 1.005–1.030)
pH: 6 (ref 5.0–8.0)

## 2016-09-14 LAB — DIFFERENTIAL
Basophils Absolute: 0 10*3/uL (ref 0.0–0.1)
Basophils Relative: 0 %
EOS ABS: 0.1 10*3/uL (ref 0.0–0.7)
EOS PCT: 1 %
LYMPHS ABS: 2.3 10*3/uL (ref 0.7–4.0)
LYMPHS PCT: 39 %
MONO ABS: 0.7 10*3/uL (ref 0.1–1.0)
Monocytes Relative: 11 %
NEUTROS PCT: 49 %
Neutro Abs: 2.8 10*3/uL (ref 1.7–7.7)

## 2016-09-14 LAB — CBC
HCT: 38.4 % (ref 36.0–46.0)
Hemoglobin: 12.9 g/dL (ref 12.0–15.0)
MCH: 31.3 pg (ref 26.0–34.0)
MCHC: 33.6 g/dL (ref 30.0–36.0)
MCV: 93.2 fL (ref 78.0–100.0)
PLATELETS: 215 10*3/uL (ref 150–400)
RBC: 4.12 MIL/uL (ref 3.87–5.11)
RDW: 14.1 % (ref 11.5–15.5)
WBC: 5.9 10*3/uL (ref 4.0–10.5)

## 2016-09-14 LAB — RAPID URINE DRUG SCREEN, HOSP PERFORMED
Amphetamines: NOT DETECTED
BARBITURATES: NOT DETECTED
Benzodiazepines: NOT DETECTED
COCAINE: NOT DETECTED
Opiates: NOT DETECTED
TETRAHYDROCANNABINOL: NOT DETECTED

## 2016-09-14 LAB — PROTIME-INR
INR: 1.06
Prothrombin Time: 13.8 seconds (ref 11.4–15.2)

## 2016-09-14 LAB — CBG MONITORING, ED
GLUCOSE-CAPILLARY: 405 mg/dL — AB (ref 65–99)
GLUCOSE-CAPILLARY: 352 mg/dL — AB (ref 65–99)

## 2016-09-14 LAB — FOLATE: FOLATE: 29.9 ng/mL (ref >5.9)

## 2016-09-14 LAB — GLUCOSE, CAPILLARY: Glucose-Capillary: 270 mg/dL — ABNORMAL HIGH (ref 65–99)

## 2016-09-14 LAB — AMMONIA: AMMONIA: 21 umol/L (ref 9–35)

## 2016-09-14 LAB — MRSA PCR SCREENING: MRSA by PCR: NEGATIVE

## 2016-09-14 LAB — VITAMIN B12: VITAMIN B 12: 569 pg/mL (ref 180–914)

## 2016-09-14 LAB — TSH: TSH: 2.036 u[IU]/mL (ref 0.350–4.500)

## 2016-09-14 LAB — URINALYSIS, MICROSCOPIC (REFLEX): WBC UA: NONE SEEN WBC/hpf (ref 0–5)

## 2016-09-14 MED ORDER — SUCRALFATE 1 GM/10ML PO SUSP
1.0000 g | Freq: Three times a day (TID) | ORAL | Status: DC
  Administered 2016-09-14 – 2016-09-18 (×16): 1 g via ORAL
  Filled 2016-09-14 (×16): qty 10

## 2016-09-14 MED ORDER — ENSURE ENLIVE PO LIQD
237.0000 mL | Freq: Two times a day (BID) | ORAL | Status: DC
  Administered 2016-09-15 – 2016-09-18 (×7): 237 mL via ORAL

## 2016-09-14 MED ORDER — EZETIMIBE 10 MG PO TABS
5.0000 mg | ORAL_TABLET | Freq: Every day | ORAL | Status: DC
  Administered 2016-09-14 – 2016-09-18 (×5): 5 mg via ORAL
  Filled 2016-09-14 (×5): qty 1

## 2016-09-14 MED ORDER — ONDANSETRON HCL 4 MG PO TABS: 4.0000 mg | ORAL_TABLET | Freq: Four times a day (QID) | ORAL | Status: DC | PRN

## 2016-09-14 MED ORDER — ONDANSETRON HCL 4 MG/2ML IJ SOLN: 4.0000 mg | Freq: Four times a day (QID) | INTRAMUSCULAR | Status: DC | PRN

## 2016-09-14 MED ORDER — SIMVASTATIN 20 MG PO TABS
20.0000 mg | ORAL_TABLET | Freq: Every day | ORAL | Status: DC
  Administered 2016-09-14 – 2016-09-18 (×5): 20 mg via ORAL
  Filled 2016-09-14 (×5): qty 1

## 2016-09-14 MED ORDER — INSULIN ASPART 100 UNIT/ML ~~LOC~~ SOLN
0.0000 [IU] | Freq: Three times a day (TID) | SUBCUTANEOUS | Status: DC
  Administered 2016-09-14: 5 [IU] via SUBCUTANEOUS
  Administered 2016-09-15: 9 [IU] via SUBCUTANEOUS
  Administered 2016-09-15: 5 [IU] via SUBCUTANEOUS
  Administered 2016-09-15: 2 [IU] via SUBCUTANEOUS
  Administered 2016-09-16: 3 [IU] via SUBCUTANEOUS
  Administered 2016-09-16: 5 [IU] via SUBCUTANEOUS
  Administered 2016-09-16: 3 [IU] via SUBCUTANEOUS
  Administered 2016-09-17: 9 [IU] via SUBCUTANEOUS
  Administered 2016-09-17: 7 [IU] via SUBCUTANEOUS
  Administered 2016-09-17: 3 [IU] via SUBCUTANEOUS
  Administered 2016-09-18: 1 [IU] via SUBCUTANEOUS
  Administered 2016-09-18: 5 [IU] via SUBCUTANEOUS

## 2016-09-14 MED ORDER — ACETAMINOPHEN 325 MG PO TABS: 650.0000 mg | ORAL_TABLET | ORAL | Status: DC | PRN

## 2016-09-14 MED ORDER — HEPARIN SODIUM (PORCINE) 5000 UNIT/ML IJ SOLN
5000.0000 [IU] | Freq: Three times a day (TID) | INTRAMUSCULAR | Status: DC
  Administered 2016-09-14 – 2016-09-18 (×10): 5000 [IU] via SUBCUTANEOUS
  Filled 2016-09-14 (×10): qty 1

## 2016-09-14 MED ORDER — INSULIN GLARGINE 100 UNIT/ML ~~LOC~~ SOLN
20.0000 [IU] | Freq: Every day | SUBCUTANEOUS | Status: DC
  Administered 2016-09-14 – 2016-09-15 (×2): 20 [IU] via SUBCUTANEOUS
  Filled 2016-09-14 (×2): qty 0.2

## 2016-09-14 MED ORDER — MULTI VITAMIN DAILY PO TABS: 1.0000 | ORAL_TABLET | Freq: Every day | ORAL | Status: DC

## 2016-09-14 MED ORDER — VITAMIN D 1000 UNITS PO TABS
2000.0000 [IU] | ORAL_TABLET | Freq: Every day | ORAL | Status: DC
  Administered 2016-09-14 – 2016-09-18 (×5): 2000 [IU] via ORAL
  Filled 2016-09-14 (×5): qty 2

## 2016-09-14 MED ORDER — CHOLECALCIFEROL 50 MCG (2000 UT) PO CAPS: 2000.0000 [IU] | ORAL_CAPSULE | Freq: Every day | ORAL | Status: DC

## 2016-09-14 MED ORDER — METOPROLOL SUCCINATE ER 25 MG PO TB24
25.0000 mg | ORAL_TABLET | Freq: Every day | ORAL | Status: DC
  Administered 2016-09-14 – 2016-09-18 (×5): 25 mg via ORAL
  Filled 2016-09-14 (×5): qty 1

## 2016-09-14 MED ORDER — LISINOPRIL 10 MG PO TABS
10.0000 mg | ORAL_TABLET | Freq: Every day | ORAL | Status: DC
  Administered 2016-09-14 – 2016-09-18 (×5): 10 mg via ORAL
  Filled 2016-09-14 (×5): qty 1

## 2016-09-14 MED ORDER — ASPIRIN 81 MG PO CHEW
81.0000 mg | CHEWABLE_TABLET | Freq: Every day | ORAL | Status: DC
  Administered 2016-09-14 – 2016-09-17 (×4): 81 mg via ORAL
  Filled 2016-09-14 (×4): qty 1

## 2016-09-14 MED ORDER — LORAZEPAM 2 MG/ML IJ SOLN: 0.5000 mg | Freq: Four times a day (QID) | INTRAMUSCULAR | Status: DC | PRN

## 2016-09-14 MED ORDER — ADULT MULTIVITAMIN W/MINERALS CH
1.0000 | ORAL_TABLET | Freq: Every day | ORAL | Status: DC
  Administered 2016-09-14 – 2016-09-18 (×5): 1 via ORAL
  Filled 2016-09-14 (×5): qty 1

## 2016-09-14 MED ORDER — SODIUM CHLORIDE 0.9 % IV BOLUS (SEPSIS)
1000.0000 mL | Freq: Once | INTRAVENOUS | Status: AC
  Administered 2016-09-14: 1000 mL via INTRAVENOUS

## 2016-09-14 MED ORDER — PANTOPRAZOLE SODIUM 40 MG PO TBEC
40.0000 mg | DELAYED_RELEASE_TABLET | Freq: Two times a day (BID) | ORAL | Status: DC
  Administered 2016-09-14 – 2016-09-18 (×9): 40 mg via ORAL
  Filled 2016-09-14 (×4): qty 1
  Filled 2016-09-14: qty 2
  Filled 2016-09-14 (×4): qty 1

## 2016-09-14 MED ORDER — SODIUM CHLORIDE 0.9 % IV SOLN: Freq: Once | INTRAVENOUS | Status: DC

## 2016-09-14 MED ORDER — HYDROCODONE-ACETAMINOPHEN 5-325 MG PO TABS
1.0000 | ORAL_TABLET | ORAL | Status: DC | PRN
  Administered 2016-09-14: 2 via ORAL
  Filled 2016-09-14: qty 2

## 2016-09-14 MED ORDER — SODIUM CHLORIDE 0.9 % IV SOLN
INTRAVENOUS | Status: DC
  Administered 2016-09-14 – 2016-09-15 (×3): via INTRAVENOUS

## 2016-09-14 MED ORDER — SODIUM CHLORIDE 0.9% FLUSH
3.0000 mL | Freq: Two times a day (BID) | INTRAVENOUS | Status: DC
  Administered 2016-09-16 – 2016-09-18 (×5): 3 mL via INTRAVENOUS

## 2016-09-14 NOTE — Plan of Care (Addendum)
76yo F with past medical history of stroke, GI bleed, reflux, DKA, diverticulitis, anemia who presented with acute encephalopathy.   HPI: Pt has had intermittent confusion over the long term and it comes and goes. Patient was noted to have acute worsening of her confusion over the last 18 hours. No psych hx. Episode of combativenss at her assistant living facility.  EDP reports word salad.  ED Course: No issues with agitation in ED. CT neg for bleed. EDP consulted neuro who advised hospitolists admit for encephalopathy/stroke workup. Stroke work-up on arrival. Status: Tele inpt  Elwin Mocha MD

## 2016-09-14 NOTE — H&P (Addendum)
History and Physical    Katie Lozano ZOX:[id number] DOB: 23-Apr-1941 DOA: 09/14/2016  PCP: Phineas Inches, MD  Patient coming from: ALF  Chief Complaint: Confusion, disorientation and word salad  HPI: Katie Lozano is a 76 y.o. female with medical history significant of IDDM, HTN, history of TIA, recent hospitalization of January 2018 secondary to respiratory failure, DKA and esophageal necrosis. Patient was in a nursing home, then discharge to ALF. All patient history obtained from sister at bedside and EDP notes, patient is confused, disoriented and could not provide any meaningful history. Per sister at bedside she started to have confusion about 2 weeks ago, likely not taking her medication, one day she was found sleeping on the floor, this is got progressively worse to the point she couldn't carry any conversation so she brought her to [facility] for further evaluation, her labs looks okay referred for hospital valuation for encephalopathy/CVA workup.  ED Course:  Vitals: WNL except blood pressure elevated at 165/55. Labs: WNL, except a glucose of 414 Imaging: CT scan without acute findings Interventions: Given 1 L of fluid in the emergency department  Review of Systems:  Review of systems was unobtainable secondary to disorientation and confusion  Past Medical History:  Diagnosis Date  . Allergy   . Anemia associated with acute blood loss 06/2016  . Colon polyp   . Diabetes mellitus without complication ([place])   . Diverticulitis   . DKA (diabetic ketoacidoses) ([place]) 07/11/2016  . GERD (gastroesophageal reflux disease)   . Hematemesis 07/11/2016  . Hyperlipidemia   . Melena 06/2016  . Stroke ([place])   . UGIB (upper gastrointestinal bleed) 06/2016    Past Surgical History:  Procedure Laterality Date  . APPENDECTOMY  1956  . CHOLECYSTECTOMY  1997  . ESOPHAGOGASTRODUODENOSCOPY N/A 07/14/2016   Procedure: ESOPHAGOGASTRODUODENOSCOPY (EGD);  Surgeon: Manus Gunning, MD;  Location: [facility];  Service: Gastroenterology;  Laterality: N/A;  at bedside  . TONSILLECTOMY     age 34     reports that she quit smoking about 4 years ago. Her smoking use included Cigarettes. She has never used smokeless tobacco. She reports that she does not drink alcohol or use drugs.  No Known Allergies  Family History  Problem Relation Age of Onset  . Heart disease Mother   . Melanoma Mother   . Stroke Mother   . Heart disease Father     MI  . Diabetes Father   . Colon cancer Maternal Uncle   . Colon polyps Maternal Uncle     Prior to Admission medications   Medication Sig Start Date End Date Taking? Authorizing Provider  acetaminophen (TYLENOL) 325 MG tablet Take 650 mg by mouth every 4 (four) hours as needed for fever.    Historical Provider, MD  Cholecalciferol (D3 SUPER STRENGTH) 2000 UNITS CAPS Take 2,000 Units by mouth daily.     Historical Provider, MD  Cholecalciferol (VITAMIN D3) 2000 units capsule Take by mouth.    Historical Provider, MD  ezetimibe (ZETIA) 10 MG tablet Take 5 mg by mouth daily. Take 1/2 tablet to = 5 mg    Historical Provider, MD  insulin aspart (NOVOLOG) 100 UNIT/ML injection Inject into the skin. 07/22/16   Historical Provider, MD  insulin glargine (LANTUS) 100 UNIT/ML injection Inject 20 Units into the skin at bedtime.    Historical Provider, MD  lisinopril (PRINIVIL,ZESTRIL) 10 MG tablet Take 10 mg by mouth daily.     Historical Provider, MD  metoprolol succinate (TOPROL-XL) 25 MG 24 hr tablet  08/25/16   Historical Provider, MD  Multiple Vitamin (MULTI VITAMIN DAILY) TABS Take 1 tablet by mouth daily.     Historical Provider, MD  pantoprazole (PROTONIX) 40 MG tablet Take 1 tablet (40 mg total) by mouth 2 (two) times daily. Switch for any other PPI at similar dose and frequency 07/22/16   Nishant Dhungel, MD  simvastatin (ZOCOR) 20 MG tablet Take 20 mg by mouth daily.  02/24/15   Historical Provider, MD  sucralfate (CARAFATE)  1 GM/10ML suspension Take 10 mLs (1 g total) by mouth every 6 (six) hours. 07/22/16 09/02/16  Nishant Dhungel, MD  sucralfate (CARAFATE) 1 GM/10ML suspension Take by mouth. 07/22/16   Historical Provider, MD    Physical Exam:  Vitals:   09/14/16 1300 09/14/16 1330 09/14/16 1400 09/14/16 1614  BP: (!) 176/60 (!) 154/60 (!) 172/60 (!) 161/55  Pulse:  64 64 65  Resp: 18 16 20 20   Temp:    98.3 F (36.8 C)  TempSrc:    Oral  SpO2:  98% 100% 99%  Weight:      Height:        Constitutional: NAD, calm, comfortable Eyes: PERRL, lids and conjunctivae normal ENMT: Mucous membranes are moist. Posterior pharynx clear of any exudate or lesions.Normal dentition.  Neck: normal, supple, no masses, no thyromegaly Respiratory: clear to auscultation bilaterally, no wheezing, no crackles. Normal respiratory effort. No accessory muscle use.  Cardiovascular: Regular rate and rhythm, no murmurs / rubs / gallops. No extremity edema. 2+ pedal pulses. No carotid bruits.  Abdomen: no tenderness, no masses palpated. No hepatosplenomegaly. Bowel sounds positive.  Musculoskeletal: no clubbing / cyanosis. No joint deformity upper and lower extremities. Good ROM, no contractures. Normal muscle tone.  Skin: no rashes, lesions, ulcers. No induration Neurologic: Confused, disoriented, word salad Strength 5/5 in all 4.  Psychiatric: Unable to examine because of confusion.  Labs on Admission: I have personally reviewed following labs and imaging studies  CBC:  Recent Labs Lab 09/14/16 1029  WBC 5.9  NEUTROABS 2.8  HGB 12.9  HCT 38.4  MCV 93.2  PLT 678   Basic Metabolic Panel:  Recent Labs Lab 09/14/16 1029  NA 136  K 3.8  CL 104  CO2 23  GLUCOSE 414*  BUN 10  CREATININE 0.76  CALCIUM 9.1   GFR: Estimated Creatinine Clearance: 52.2 mL/min (by C-G formula based on SCr of 0.76 mg/dL). Liver Function Tests:  Recent Labs Lab 09/14/16 1029  AST 28  ALT 19  ALKPHOS 66  BILITOT 0.9  PROT 7.0    ALBUMIN 3.6   No results for input(s): LIPASE, AMYLASE in the last 168 hours. No results for input(s): AMMONIA in the last 168 hours. Coagulation Profile: No results for input(s): INR, PROTIME in the last 168 hours. Cardiac Enzymes: No results for input(s): CKTOTAL, CKMB, CKMBINDEX, TROPONINI in the last 168 hours. BNP (last 3 results) No results for input(s): PROBNP in the last 8760 hours. HbA1C: No results for input(s): HGBA1C in the last 72 hours. CBG:  Recent Labs Lab 09/14/16 1007 09/14/16 1301 09/14/16 1629  GLUCAP 405* 352* 270*   Lipid Profile: No results for input(s): CHOL, HDL, LDLCALC, TRIG, CHOLHDL, LDLDIRECT in the last 72 hours. Thyroid Function Tests: No results for input(s): TSH, T4TOTAL, FREET4, T3FREE, THYROIDAB in the last 72 hours. Anemia Panel: No results for input(s): VITAMINB12, FOLATE, FERRITIN, TIBC, IRON, RETICCTPCT in the last 72 hours. Urine analysis:  Component Value Date/Time   COLORURINE YELLOW 09/14/2016 1150   APPEARANCEUR CLEAR 09/14/2016 1150   LABSPEC 1.027 09/14/2016 1150   PHURINE 6.0 09/14/2016 1150   GLUCOSEU >=500 (A) 09/14/2016 1150   HGBUR TRACE (A) 09/14/2016 1150   BILIRUBINUR NEGATIVE 09/14/2016 1150   KETONESUR 15 (A) 09/14/2016 1150   PROTEINUR NEGATIVE 09/14/2016 1150   NITRITE NEGATIVE 09/14/2016 1150   LEUKOCYTESUR NEGATIVE 09/14/2016 1150   Sepsis Labs: !!!!!!!!!!!!!!!!!!!!!!!!!!!!!!!!!!!!!!!!!!!! Invalid input(s): PROCALCITONIN, LACTICIDVEN No results found for this or any previous visit (from the past 240 hour(s)).   Radiological Exams on Admission: Dg Chest 2 View  Result Date: 09/14/2016 CLINICAL DATA:  76 year old female with confusion. Difficulty walking. EXAM: CHEST  2 VIEW COMPARISON:  Chest x-ray 07/11/2016. FINDINGS: Diffuse peribronchial cuffing. Lung volumes are normal. No consolidative airspace disease. No pleural effusions. No pneumothorax. No pulmonary nodule or mass noted. Pulmonary vasculature  and the cardiomediastinal silhouette are within normal limits. Aortic atherosclerosis. IMPRESSION: 1. Diffuse peribronchial cuffing, concerning for an acute bronchitis. 2. Aortic atherosclerosis. Electronically Signed   By: Vinnie Langton M.D.   On: 09/14/2016 11:51   Ct Head Wo Contrast  Result Date: 09/14/2016 CLINICAL DATA:  Altered mental status this morning. Difficulty walking. EXAM: CT HEAD WITHOUT CONTRAST TECHNIQUE: Contiguous axial images were obtained from the base of the skull through the vertex without intravenous contrast. COMPARISON:  06/05/2016 FINDINGS: Brain: Ventricles, cisterns and other CSF spaces are within normal. Mild chronic ischemic microvascular disease is present. There is no mass, mass effect, shift of midline structures or acute hemorrhage. No evidence to suggest acute infarction. Vascular: Calcified plaque over the cavernous segment of the internal carotid arteries. Skull: Within normal. Sinuses/Orbits: Within normal. Other: None. IMPRESSION: No acute intracranial findings. Chronic ischemic microvascular disease. Electronically Signed   By: Marin Olp M.D.   On: 09/14/2016 11:53    EKG: Independently reviewed.   Assessment/Plan Principal Problem:   Acute encephalopathy Active Problems:   Type 2 diabetes mellitus with diabetic nephropathy, with long-term current use of insulin (HCC)   Essential hypertension   Esophageal necrosis   Dementia without behavioral disturbance    Acute encephalopathy Unclear etiology, cannot rule out acute metabolic encephalopathy secondary to hyperglycemia/dehydration. No evidence of infection, UA is clear, CXR without acute findings, no fever or leukocytosis. CT scan of the head is negative, will check MRI of the brain to rule out CVA. Hydrate with IV fluids (this is LVEF is 55-60% with G1DD). Check folate, TSH and ammonia.  Type 2 diabetes mellitus, uncontrolled with hyperglycemia Presented with blood glucose of 414,  bicarbonate is 23 no evidence of acidosis or ketosis. Restarted on Lantus 20 units at night and insulin sliding scale. Carbohydrate modified diet, check hemoglobin A1c, last one was 8.8 on 07/11/2016.  Recent history of GI bleed and esophageal necrosis Appears to be stable, no complaints of any epigastric abdominal pain, hemoglobin of 12.9. Continue PPIs and Carafate, recommend follow-up EGD as outpatient.  Essential hypertension She is on lisinopril and Toprol-XL, blood pressure is elevated, likely she did not take her medication this morning. Restarted home medications.  Dementia without behavioral disturbances Sister at bedside reported she had memory problems before all this happened. She has risk factor for vascular dementia, diabetes, hypertension and history of recurrent TIAs. LDL is 48.  History of TIAs No focal deficits (no dysarthria) she rather has word salad and nonsensical speech. MRI to be done, started on low-dose aspirin per last recommendation from Dr. Erlinda Hong office visit on  3/60/18.   DVT prophylaxis: SQ Heparin Code Status: Full Code Family Communication: Plan D/W patient's sister at bedside. Disposition Plan: Home Consults called:  Admission status: Observation   [facility] A MD [facility] Pager [phone]  If 7PM-7AM, please contact night-coverage [email or web address] Password [facility]  09/14/2016, 5:04 PM

## 2016-09-14 NOTE — ED Notes (Signed)
PT transferred to [id number] via [facility].

## 2016-09-14 NOTE — ED Provider Notes (Signed)
[facility] DEPT [facility] Provider Note   CSN: [id number] Arrival date & time: 09/14/16  3220     History   Chief Complaint Chief Complaint  Patient presents with  . Hyperglycemia  . Altered Mental Status    HPI Katie Lozano is a 76 y.o. female.  The history is provided by the patient and a relative (Sister).     Level V caveat due to altered mental status.  Katie Lozano is a 75 y.o. female, with a history of DM, TIA, DKA, and GI bleed, presenting to the ED with abnormal behavior and hyperglycemia.  Patient's sister, Mardene Sayer, states patient has had some confusion with speaking "gibberish" and not knowing what is going on around her. This was intermittent for the last week. She was normal on Friday, March 16, but then seemed consistently confused yesterday  Sister states patient will be speaking normally and coherently and then will just start speaking words that don't make sense. Patient seems to realize she is speaking words that don't make sense and laughs about it.  Sister endorses hyperglycemia this week with BG in 300-450 range. This morning didn't come down for breakfast, staff went to help her get up and dressed, became combative with staff.   Pt states she "feels fine."   Pt had what was thought to be TIAs in December 2017 and then had an admission in the ICU for DKA in January 2018. She was completely independent prior to this admission, but had to be placed in [place] [place] assisted living afterward due memory issues and difficulty ambulating. She ambulates with a walker following that admission as well. She has been seeing a neurology, Dr. Erlinda Hong, with the last assessment about a week ago.  Patient's sister states today's presentation seems to be more consistent with how she was acting leading up to her admission for DKA in January.  Denies fever/chills, N/V/D, chest pain, shortness of breath, dizziness, pain, cough, or any other complaints.    Past Medical History:    Diagnosis Date  . Allergy   . Anemia associated with acute blood loss 06/2016  . Colon polyp   . Diabetes mellitus without complication ([place])   . Diverticulitis   . DKA (diabetic ketoacidoses) ([place]) 07/11/2016  . GERD (gastroesophageal reflux disease)   . Hematemesis 07/11/2016  . Hyperlipidemia   . Melena 06/2016  . Stroke ([place])   . UGIB (upper gastrointestinal bleed) 06/2016    Patient Active Problem List   Diagnosis Date Noted  . Acute encephalopathy 09/14/2016  . 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 Encephalopathy 09/02/2016  . Diabetes mellitus ([place]) 09/02/2016  . Anemia 09/02/2016  . 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 NSTEMI (non-ST elevated myocardial infarction) ([place]) 07/22/2016  . Pressure injury of skin 07/17/2016  . Encounter for central line placement   . Metabolic acidosis   . AKI (acute kidney injury) ([place])   . UGIB (upper gastrointestinal bleed)   . Melena   . Acute blood loss anemia   . Diabetic ketoacidosis without coma associated with type 2 diabetes mellitus ([place]) 07/11/2016  . Hypovolemic shock ([place])   . Essential hypertension 06/06/2016  . Hypokalemia 06/06/2016  . CKD (chronic kidney disease), stage III 06/06/2016  . Carotid artery syndrome 06/05/2016  . Type 2 diabetes mellitus with diabetic nephropathy, with long-term current use of insulin ([place]) 03/30/2015  . History of colonic polyps 03/30/2015  . Hyperlipidemia 03/30/2015    Past Surgical History:  Procedure Laterality Date  . APPENDECTOMY  [place]  . ESOPHAGOGASTRODUODENOSCOPY N/A 07/14/2016   Procedure: ESOPHAGOGASTRODUODENOSCOPY (EGD);  Surgeon: Manus Gunning, MD;  Location: [facility];  Service: Gastroenterology;  Laterality: N/A;  at bedside  . TONSILLECTOMY     age 49    OB History    No data available       Home Medications    Prior to Admission medications   Medication Sig Start Date End Date Taking? Authorizing Provider   acetaminophen (TYLENOL) 325 MG tablet Take 650 mg by mouth every 4 (four) hours as needed for fever.    Historical Provider, MD  Cholecalciferol (D3 SUPER STRENGTH) 2000 UNITS CAPS Take 2,000 Units by mouth daily.     Historical Provider, MD  Cholecalciferol (VITAMIN D3) 2000 units capsule Take by mouth.    Historical Provider, MD  ezetimibe (ZETIA) 10 MG tablet Take 5 mg by mouth daily. Take 1/2 tablet to = 5 mg    Historical Provider, MD  insulin aspart (NOVOLOG) 100 UNIT/ML injection Inject into the skin. 07/22/16   Historical Provider, MD  insulin glargine (LANTUS) 100 UNIT/ML injection Inject 20 Units into the skin at bedtime.    Historical Provider, MD  lisinopril (PRINIVIL,ZESTRIL) 10 MG tablet Take 10 mg by mouth daily.     Historical Provider, MD  metoprolol succinate (TOPROL-XL) 25 MG 24 hr tablet  08/25/16   Historical Provider, MD  Multiple Vitamin (MULTI VITAMIN DAILY) TABS Take 1 tablet by mouth daily.     Historical Provider, MD  pantoprazole (PROTONIX) 40 MG tablet Take 1 tablet (40 mg total) by mouth 2 (two) times daily. Switch for any other PPI at similar dose and frequency 07/22/16   Nishant Dhungel, MD  simvastatin (ZOCOR) 20 MG tablet Take 20 mg by mouth daily.  02/24/15   Historical Provider, MD  sucralfate (CARAFATE) 1 GM/10ML suspension Take 10 mLs (1 g total) by mouth every 6 (six) hours. 07/22/16 09/02/16  Nishant Dhungel, MD  sucralfate (CARAFATE) 1 GM/10ML suspension Take by mouth. 07/22/16   Historical Provider, MD    Family History Family History  Problem Relation Age of Onset  . Heart disease Mother   . Melanoma Mother   . Stroke Mother   . Heart disease Father     MI  . Diabetes Father   . Colon cancer Maternal Uncle   . Colon polyps Maternal Uncle     Social History Social History  Substance Use Topics  . Smoking status: Former Smoker    Types: Cigarettes    Quit date: 06/29/2012  . Smokeless tobacco: Never Used  . Alcohol use No     Allergies    Patient has no known allergies.   Review of Systems Review of Systems  Unable to perform ROS: Mental status change  Constitutional: Negative for chills, diaphoresis and fever.  Respiratory: Negative for cough and shortness of breath.   Cardiovascular: Negative for chest pain.  Gastrointestinal: Negative for abdominal pain, diarrhea, nausea and vomiting.  Genitourinary: Negative for dysuria.  Neurological: Negative for dizziness, syncope, weakness, light-headedness, numbness and headaches.  All other systems reviewed and are negative.    Physical Exam Updated Vital Signs BP (!) 180/51 (BP Location: Right Arm)   Pulse 62   Temp 97.8 F (36.6 C) (Oral)   Resp 16   Ht 5\' 2"  (1.575 m)   Wt 65.3 kg   SpO2 97%   BMI 26.34 kg/m   Physical Exam  Constitutional: She appears well-developed and  well-nourished. No distress.  HENT:  Head: Normocephalic and atraumatic.  Eyes: Conjunctivae are normal.  Neck: Neck supple.  Cardiovascular: Normal rate, regular rhythm, normal heart sounds and intact distal pulses.   Pulmonary/Chest: Effort normal and breath sounds normal. No respiratory distress.  Abdominal: Soft. There is no tenderness. There is no guarding.  Musculoskeletal: She exhibits no edema.  Lymphadenopathy:    She has no cervical adenopathy.  Neurological: She is alert.  Patient does not know the date, year, or month. She does not know the city. She does not know why she is here. Sister states this is all abnormal for the patient.   Strength: Upper extremity strength 4/5 bilaterally. Lower extremity strength 4/5, but equal bilaterally.  Coordination: Patient is able to grossly perform finger to nose testing.    Intermittently follows commands. Starts to speak saying inappropriate things. The words are formed and well articulated, but do not make sense in the present context. For example, I ask the patient to raise her arms and she says, "You haven't yet begun to think of me  in the abstract sense. You don't know." Patient then stopped speaking, but maintained eye contact as if she had said her part of the conversation and was waiting for me to respond.  At another point, patient was asked a question and she starting saying random words and then reading the names of the providers off the white board on the wall.  Skin: Skin is warm and dry. She is not diaphoretic.  Psychiatric: She has a normal mood and affect.  Nursing note and vitals reviewed.    ED Treatments / Results  Labs (all labs ordered are listed, but only abnormal results are displayed) Labs Reviewed  URINALYSIS, ROUTINE W REFLEX MICROSCOPIC - Abnormal; Notable for the following:       Result Value   Glucose, UA >=500 (*)    Hgb urine dipstick TRACE (*)    Ketones, ur 15 (*)    All other components within normal limits  BASIC METABOLIC PANEL - Abnormal; Notable for the following:    Glucose, Bld 414 (*)    All other components within normal limits  URINALYSIS, MICROSCOPIC (REFLEX) - Abnormal; Notable for the following:    Bacteria, UA RARE (*)    Squamous Epithelial / LPF 0-5 (*)    All other components within normal limits  CBG MONITORING, ED - Abnormal; Notable for the following:    Glucose-Capillary 405 (*)    All other components within normal limits  CBG MONITORING, ED - Abnormal; Notable for the following:    Glucose-Capillary 352 (*)    All other components within normal limits  CBC  HEPATIC FUNCTION PANEL  DIFFERENTIAL  RAPID URINE DRUG SCREEN, HOSP PERFORMED    EKG  EKG Interpretation  Date/Time:  Sunday September 14 2016 10:20:34 EDT Ventricular Rate:  64 PR Interval:    QRS Duration: 123 QT Interval:  480 QTC Calculation: 496 R Axis:   4 Text Interpretation:  Sinus rhythm Probable left atrial enlargement Left bundle branch block Confirmed by DELO  MD, DOUGLAS ([id number]) on 09/14/2016 11:46:50 AM       Radiology Dg Chest 2 View  Result Date: 09/14/2016 CLINICAL DATA:   76 year old female with confusion. Difficulty walking. EXAM: CHEST  2 VIEW COMPARISON:  Chest x-ray 07/11/2016. FINDINGS: Diffuse peribronchial cuffing. Lung volumes are normal. No consolidative airspace disease. No pleural effusions. No pneumothorax. No pulmonary nodule or mass noted. Pulmonary vasculature and the cardiomediastinal silhouette  are within normal limits. Aortic atherosclerosis. IMPRESSION: 1. Diffuse peribronchial cuffing, concerning for an acute bronchitis. 2. Aortic atherosclerosis. Electronically Signed   By: Vinnie Langton M.D.   On: 09/14/2016 11:51   Ct Head Wo Contrast  Result Date: 09/14/2016 CLINICAL DATA:  Altered mental status this morning. Difficulty walking. EXAM: CT HEAD WITHOUT CONTRAST TECHNIQUE: Contiguous axial images were obtained from the base of the skull through the vertex without intravenous contrast. COMPARISON:  06/05/2016 FINDINGS: Brain: Ventricles, cisterns and other CSF spaces are within normal. Mild chronic ischemic microvascular disease is present. There is no mass, mass effect, shift of midline structures or acute hemorrhage. No evidence to suggest acute infarction. Vascular: Calcified plaque over the cavernous segment of the internal carotid arteries. Skull: Within normal. Sinuses/Orbits: Within normal. Other: None. IMPRESSION: No acute intracranial findings. Chronic ischemic microvascular disease. Electronically Signed   By: Marin Olp M.D.   On: 09/14/2016 11:53    Procedures Procedures (including critical care time)  Medications Ordered in ED Medications  LORazepam (ATIVAN) injection 0.5 mg (not administered)  0.9 %  sodium chloride infusion (not administered)  sodium chloride 0.9 % bolus 1,000 mL (0 mLs Intravenous Stopped 09/14/16 1256)     Initial Impression / Assessment and Plan / ED Course  I have reviewed the triage vital signs and the nursing notes.  Pertinent labs & imaging results that were available during my care of the patient  were reviewed by me and considered in my medical decision making (see chart for details).     Patient presents with altered mental status for the last 2 days. Hyperglycemia noted, but without anion gap. Patient's lab findings and vital signs are not consistent with DKA. No acute abnormalities on CT. Abnormalities on chest x-ray noted, however, patient and patient's sister deny symptoms of bronchitis. Neuro consult and admission for further workup.  12:57 PM Spoke with Dr. Wendee Beavers, neurologist, who agrees with transfer to [facility] for admission via hospitalist. She will need a MRI and further altered mental status workup. 1:39 PM Spoke with Dr. Aggie Moats, hospitalist at [facility], who agreed to admit the patient to telemetry  The plan of care was discussed with the patient and her sister. Both parties agree to the plan.   Findings and plan of care discussed with Veryl Speak, MD.   Vitals:   09/14/16 1230 09/14/16 1300 09/14/16 1330 09/14/16 1400  BP: (!) 176/70 (!) 176/60 (!) 154/60 (!) 172/60  Pulse: (!) 58  64 64  Resp: 16 18 16 20   Temp:      TempSrc:      SpO2: 97%  98% 100%  Weight:      Height:         Final Clinical Impressions(s) / ED Diagnoses   Final diagnoses:  Altered mental status, unspecified altered mental status type    New Prescriptions New Prescriptions   No medications on file     Lorayne Bender, Hershal Coria 09/14/16 [place], MD 09/14/16 1525

## 2016-09-14 NOTE — Progress Notes (Signed)
Patient admitted to [phone]. Transported by [employer]. Skin assessment completed with 2 RNs. Patient alert, but unable to verify name. NSL R AC. [place] admissions paged to advise of patient arrival to floor. Dr. Hartford Poli to bedside. Sister who is HCPOA at bedside. Copy of HCPOA requested. Pt assisted to bathroom with 1 assist, gait unsteady. Bed alarm in place. Pt and sister oriented to room. POC reviewed with sister and patient. Nutrition notified of diet order. Transport arrived to take patient to MRI. Will continue to monitor. Bartholomew Crews, RN

## 2016-09-14 NOTE — ED Triage Notes (Addendum)
Hx of diabetes, DKA in january. Pt lives at assisted living. Noted to be confused this morning and elevated sugar. Per sister, pt is normally CAO x4 and ambulatory. Pt also had difficulty walking this morning.

## 2016-09-15 DIAGNOSIS — I639 Cerebral infarction, unspecified: Secondary | ICD-10-CM | POA: Diagnosis not present

## 2016-09-15 DIAGNOSIS — E1140 Type 2 diabetes mellitus with diabetic neuropathy, unspecified: Secondary | ICD-10-CM | POA: Diagnosis not present

## 2016-09-15 DIAGNOSIS — Z794 Long term (current) use of insulin: Secondary | ICD-10-CM | POA: Diagnosis not present

## 2016-09-15 DIAGNOSIS — I69391 Dysphagia following cerebral infarction: Secondary | ICD-10-CM | POA: Diagnosis not present

## 2016-09-15 DIAGNOSIS — E43 Unspecified severe protein-calorie malnutrition: Secondary | ICD-10-CM | POA: Diagnosis not present

## 2016-09-15 DIAGNOSIS — Z9049 Acquired absence of other specified parts of digestive tract: Secondary | ICD-10-CM | POA: Diagnosis not present

## 2016-09-15 DIAGNOSIS — I129 Hypertensive chronic kidney disease with stage 1 through stage 4 chronic kidney disease, or unspecified chronic kidney disease: Secondary | ICD-10-CM | POA: Diagnosis not present

## 2016-09-15 DIAGNOSIS — R4702 Dysphasia: Secondary | ICD-10-CM | POA: Diagnosis not present

## 2016-09-15 DIAGNOSIS — K219 Gastro-esophageal reflux disease without esophagitis: Secondary | ICD-10-CM | POA: Diagnosis present

## 2016-09-15 DIAGNOSIS — R410 Disorientation, unspecified: Secondary | ICD-10-CM | POA: Diagnosis not present

## 2016-09-15 DIAGNOSIS — E876 Hypokalemia: Secondary | ICD-10-CM | POA: Diagnosis not present

## 2016-09-15 DIAGNOSIS — Z833 Family history of diabetes mellitus: Secondary | ICD-10-CM | POA: Diagnosis not present

## 2016-09-15 DIAGNOSIS — I447 Left bundle-branch block, unspecified: Secondary | ICD-10-CM | POA: Diagnosis present

## 2016-09-15 DIAGNOSIS — Z87891 Personal history of nicotine dependence: Secondary | ICD-10-CM | POA: Diagnosis not present

## 2016-09-15 DIAGNOSIS — G9340 Encephalopathy, unspecified: Secondary | ICD-10-CM | POA: Diagnosis not present

## 2016-09-15 DIAGNOSIS — Z823 Family history of stroke: Secondary | ICD-10-CM | POA: Diagnosis not present

## 2016-09-15 DIAGNOSIS — I10 Essential (primary) hypertension: Secondary | ICD-10-CM | POA: Diagnosis not present

## 2016-09-15 DIAGNOSIS — D649 Anemia, unspecified: Secondary | ICD-10-CM | POA: Diagnosis not present

## 2016-09-15 DIAGNOSIS — K209 Esophagitis, unspecified: Secondary | ICD-10-CM | POA: Diagnosis present

## 2016-09-15 DIAGNOSIS — E1110 Type 2 diabetes mellitus with ketoacidosis without coma: Secondary | ICD-10-CM | POA: Diagnosis not present

## 2016-09-15 DIAGNOSIS — Z79899 Other long term (current) drug therapy: Secondary | ICD-10-CM | POA: Diagnosis not present

## 2016-09-15 DIAGNOSIS — I69314 Frontal lobe and executive function deficit following cerebral infarction: Secondary | ICD-10-CM | POA: Diagnosis not present

## 2016-09-15 DIAGNOSIS — F0390 Unspecified dementia without behavioral disturbance: Secondary | ICD-10-CM | POA: Diagnosis not present

## 2016-09-15 DIAGNOSIS — E785 Hyperlipidemia, unspecified: Secondary | ICD-10-CM | POA: Diagnosis not present

## 2016-09-15 DIAGNOSIS — I6310 Cerebral infarction due to embolism of unspecified precerebral artery: Secondary | ICD-10-CM | POA: Diagnosis not present

## 2016-09-15 DIAGNOSIS — R1314 Dysphagia, pharyngoesophageal phase: Secondary | ICD-10-CM | POA: Diagnosis not present

## 2016-09-15 DIAGNOSIS — E872 Acidosis: Secondary | ICD-10-CM | POA: Diagnosis not present

## 2016-09-15 DIAGNOSIS — E1165 Type 2 diabetes mellitus with hyperglycemia: Secondary | ICD-10-CM | POA: Diagnosis present

## 2016-09-15 DIAGNOSIS — I69390 Apraxia following cerebral infarction: Secondary | ICD-10-CM | POA: Diagnosis not present

## 2016-09-15 DIAGNOSIS — I69398 Other sequelae of cerebral infarction: Secondary | ICD-10-CM | POA: Diagnosis not present

## 2016-09-15 DIAGNOSIS — E1121 Type 2 diabetes mellitus with diabetic nephropathy: Secondary | ICD-10-CM | POA: Diagnosis not present

## 2016-09-15 DIAGNOSIS — G92 Toxic encephalopathy: Secondary | ICD-10-CM | POA: Diagnosis present

## 2016-09-15 DIAGNOSIS — E860 Dehydration: Secondary | ICD-10-CM | POA: Diagnosis present

## 2016-09-15 DIAGNOSIS — R4182 Altered mental status, unspecified: Secondary | ICD-10-CM | POA: Diagnosis not present

## 2016-09-15 DIAGNOSIS — M6281 Muscle weakness (generalized): Secondary | ICD-10-CM | POA: Diagnosis not present

## 2016-09-15 DIAGNOSIS — R29704 NIHSS score 4: Secondary | ICD-10-CM | POA: Diagnosis present

## 2016-09-15 DIAGNOSIS — I252 Old myocardial infarction: Secondary | ICD-10-CM | POA: Diagnosis not present

## 2016-09-15 DIAGNOSIS — Z6824 Body mass index (BMI) 24.0-24.9, adult: Secondary | ICD-10-CM | POA: Diagnosis not present

## 2016-09-15 DIAGNOSIS — N183 Chronic kidney disease, stage 3 (moderate): Secondary | ICD-10-CM | POA: Diagnosis not present

## 2016-09-15 DIAGNOSIS — R9401 Abnormal electroencephalogram [EEG]: Secondary | ICD-10-CM | POA: Diagnosis present

## 2016-09-15 DIAGNOSIS — I69315 Cognitive social or emotional deficit following cerebral infarction: Secondary | ICD-10-CM | POA: Diagnosis not present

## 2016-09-15 DIAGNOSIS — K228 Other specified diseases of esophagus: Secondary | ICD-10-CM | POA: Diagnosis not present

## 2016-09-15 DIAGNOSIS — Z800 Family history of malignant neoplasm of digestive organs: Secondary | ICD-10-CM | POA: Diagnosis not present

## 2016-09-15 DIAGNOSIS — Z8249 Family history of ischemic heart disease and other diseases of the circulatory system: Secondary | ICD-10-CM | POA: Diagnosis not present

## 2016-09-15 DIAGNOSIS — F0150 Vascular dementia without behavioral disturbance: Secondary | ICD-10-CM | POA: Diagnosis not present

## 2016-09-15 LAB — CBC
HEMATOCRIT: 41.4 % (ref 36.0–46.0)
Hemoglobin: 13.9 g/dL (ref 12.0–15.0)
MCH: 30.5 pg (ref 26.0–34.0)
MCHC: 33.6 g/dL (ref 30.0–36.0)
MCV: 91 fL (ref 78.0–100.0)
Platelets: 230 10*3/uL (ref 150–400)
RBC: 4.55 MIL/uL (ref 3.87–5.11)
RDW: 14.1 % (ref 11.5–15.5)
WBC: 8.5 10*3/uL (ref 4.0–10.5)

## 2016-09-15 LAB — BASIC METABOLIC PANEL
Anion gap: 13 (ref 5–15)
BUN: 5 mg/dL — AB (ref 6–20)
CO2: 21 mmol/L — ABNORMAL LOW (ref 22–32)
CREATININE: 0.58 mg/dL (ref 0.44–1.00)
Calcium: 9.1 mg/dL (ref 8.9–10.3)
Chloride: 106 mmol/L (ref 101–111)
GFR calc Af Amer: >60 mL/min (ref >60)
GLUCOSE: 180 mg/dL — AB (ref 65–99)
POTASSIUM: 3.4 mmol/L — AB (ref 3.5–5.1)
Sodium: 140 mmol/L (ref 135–145)

## 2016-09-15 LAB — GLUCOSE, CAPILLARY
Glucose-Capillary: 177 mg/dL — ABNORMAL HIGH (ref 65–99)
Glucose-Capillary: 291 mg/dL — ABNORMAL HIGH (ref 65–99)
Glucose-Capillary: 351 mg/dL — ABNORMAL HIGH (ref 65–99)
Glucose-Capillary: 281 mg/dL — ABNORMAL HIGH (ref 65–99)

## 2016-09-15 LAB — HEMOGLOBIN A1C
Hgb A1c MFr Bld: 7.9 % — ABNORMAL HIGH (ref 4.8–5.6)
MEAN PLASMA GLUCOSE: 180 mg/dL

## 2016-09-15 MED ORDER — STROKE: EARLY STAGES OF RECOVERY BOOK
Freq: Once | Status: AC
  Administered 2016-09-15: 12:00:00
  Filled 2016-09-15: qty 1

## 2016-09-15 NOTE — Evaluation (Signed)
Physical Therapy Evaluation Patient Details Name: Katie Lozano MRN: [id number] DOB: [birth year] Today's Date: 09/15/2016   History of Present Illness  76 yo female with new admit for acute encephalopathy with bronchitis, PMHx:  GI bleed, DKA, AKI, NSTEMI with shock, erosive gastritis, esophageal necrosis, dementia, DM, HTN,   Clinical Impression  Pt is up to walk a few steps with PT to get on and off BSC, daughter in attendance to discuss PLOF.  Pt is very cooperative and confused, but is interested in being mobile.  Had pt up in chair at end of session with her chair pad attached and discussed transfer with nursing to follow up.  Will follow acutely for strengthening and balance., gait to increase her safety and ease of completing PT in a SNF setting.  Family may be interested in LTC for placement finally after her rehab time is completed.    Follow Up Recommendations SNF    Equipment Recommendations  None recommended by PT    Recommendations for Other Services       Precautions / Restrictions Precautions Precautions: Fall (telemetry) Restrictions Weight Bearing Restrictions: No      Mobility  Bed Mobility Overal bed mobility: Needs Assistance Bed Mobility: Supine to Sit;Rolling Rolling: Mod assist   Supine to sit: Mod assist     General bed mobility comments: cues for sequence with pt demonstrating ability to follow but not initiate  Transfers Overall transfer level: Needs assistance Equipment used: 1 person hand held assist;2 person hand held assist;Rolling walker (2 wheeled) Transfers: Sit to/from [facility] Sit to Stand: Mod assist Stand pivot transfers: Min assist;From elevated surface       General transfer comment: needs 100% cues for hand placement  Ambulation/Gait Ambulation/Gait assistance: Min assist;Mod assist Ambulation Distance (Feet): 3 Feet Assistive device: Rolling walker (2 wheeled);1 person hand held assist Gait  Pattern/deviations: Step-to pattern;Wide base of support;Trunk flexed;Decreased stride length;Shuffle Gait velocity: reduced Gait velocity interpretation: Below normal speed for age/gender General Gait Details: pt is up from bsc after PT assisted mod assist to pivot there, mod assist to pivot on walker with safety issues  Stairs            Wheelchair Mobility    Modified Rankin (Stroke Patients Only)       Balance Overall balance assessment: Needs assistance Sitting-balance support: Feet supported;Bilateral upper extremity supported Sitting balance-Leahy Scale: Fair   Postural control: Posterior lean Standing balance support: Bilateral upper extremity supported Standing balance-Leahy Scale: Poor                               Pertinent Vitals/Pain Pain Assessment: Faces Faces Pain Scale: Hurts even more Pain Location: sacral area with initial movement Pain Descriptors / Indicators: Sore Pain Intervention(s): Repositioned;Other (comment) (dried skin from sitting in moisture)    Home Living Family/patient expects to be discharged to:: Skilled nursing facility                      Prior Function Level of Independence: Needs assistance   Gait / Transfers Assistance Needed: walked with walker and assistance at ALF, drove and I prior to Jan 2018 hospitalization  ADL's / Homemaking Assistance Needed: ALF to assist all cooking and cleaning        Hand Dominance   Dominant Hand: Right    Extremity/Trunk Assessment   Upper Extremity Assessment Upper Extremity Assessment: Generalized weakness  Lower Extremity Assessment Lower Extremity Assessment: Generalized weakness    Cervical / Trunk Assessment Cervical / Trunk Assessment: Normal  Communication   Communication: No difficulties  Cognition Arousal/Alertness: Awake/alert Behavior During Therapy: Impulsive Overall Cognitive Status: History of cognitive impairments - at baseline                  General Comments: pt has more recently been having memory changes    General Comments General comments (skin integrity, edema, etc.): Pt is with her daughter who filled in gaps of history as pt cannot provide details    Exercises     Assessment/Plan    PT Assessment Patient needs continued PT services  PT Problem List Decreased strength;Decreased range of motion;Decreased activity tolerance;Decreased balance;Decreased mobility;Decreased coordination;Decreased cognition;Decreased knowledge of use of DME;Decreased safety awareness;Cardiopulmonary status limiting activity;Pain       PT Treatment Interventions Gait training;DME instruction;Functional mobility training;Therapeutic activities;Therapeutic exercise;Balance training;Neuromuscular re-education;Patient/family education    PT Goals (Current goals can be found in the Care Plan section)  Acute Rehab PT Goals Patient Stated Goal: none stated x has to get to [facility] PT Goal Formulation: With family Time For Goal Achievement: 09/29/16 Potential to Achieve Goals: Good    Frequency Min 3X/week   Barriers to discharge Decreased caregiver support (ALF may not be staffed adequately for current needs)      Co-evaluation               End of Session Equipment Utilized During Treatment: Gait belt Activity Tolerance: Patient tolerated treatment well;Patient limited by fatigue Patient left: in chair;with call bell/phone within reach;with chair alarm set;with family/visitor present Nurse Communication: Mobility status PT Visit Diagnosis: Unsteadiness on feet (R26.81);Muscle weakness (generalized) (M62.81)    Functional Assessment Tool Used: AM-PAC 6 Clicks Basic Mobility;Clinical judgement Functional Limitation: Mobility: Walking and moving around Mobility: Walking and Moving Around Current Status (V8184): At least 40 percent but less than 60 percent impaired, limited or restricted Mobility: Walking and Moving Around  Goal Status [id number]): At least 1 percent but less than 20 percent impaired, limited or restricted    Time: 1325-1351 PT Time Calculation (min) (ACUTE ONLY): 26 min   Charges:   PT Evaluation $PT Eval Low Complexity: 1 Procedure PT Treatments $Therapeutic Activity: 8-22 mins   PT G Codes:   PT G-Codes **NOT FOR INPATIENT CLASS** Functional Assessment Tool Used: AM-PAC 6 Clicks Basic Mobility;Clinical judgement Functional Limitation: Mobility: Walking and moving around Mobility: Walking and Moving Around Current Status (H6067): At least 40 percent but less than 60 percent impaired, limited or restricted Mobility: Walking and Moving Around Goal Status [phone]): At least 1 percent but less than 20 percent impaired, limited or restricted     Ramond Dial 09/15/2016, 3:05 PM  Mee Hives, PT MS Acute Rehab Dept. Number: [place] [facility] [place]

## 2016-09-15 NOTE — Consult Note (Signed)
Requesting Physician: Dr. Hartford Poli    Chief Complaint: Stroke  History obtained from:  Daughter  HPI:                                                                                                                                         Katie Lozano is an 76 y.o. female with medical history significant of IDDM, HTN, history of TIA, recent hospitalization of January 2018 secondary to respiratory failure, DKA and esophageal necrosis. Patient was in a nursing home, then discharge to ALF.  Per sister at bedside she started to have confusion about 2 weeks ago, likely not taking her medication, one day she was found sleeping on the floor, this is got progressively worse to the point she couldn't carry any conversation so she brought her to [facility] for further evaluation, her labs looks okay referred for hospital valuation for encephalopathy/CVA workup. Currently she is unable to give any hikstory and the sister at bedside gives all history. She is on ASA daily but was taken off the Plavix due to esophageal necrosis.   Date last known well: Unable to determine Time last known well: Unable to determine tPA Given: No: out of window   Past Medical History:  Diagnosis Date  . Allergy   . Anemia associated with acute blood loss 06/2016  . Colon polyp   . Diabetes mellitus without complication ([place])   . Diverticulitis   . DKA (diabetic ketoacidoses) ([place]) 07/11/2016  . GERD (gastroesophageal reflux disease)   . Hematemesis 07/11/2016  . Hyperlipidemia   . Melena 06/2016  . Stroke ([place])   . UGIB (upper gastrointestinal bleed) 06/2016    Past Surgical History:  Procedure Laterality Date  . APPENDECTOMY  1956  . CHOLECYSTECTOMY  1997  . ESOPHAGOGASTRODUODENOSCOPY N/A 07/14/2016   Procedure: ESOPHAGOGASTRODUODENOSCOPY (EGD);  Surgeon: Manus Gunning, MD;  Location: [facility];  Service: Gastroenterology;  Laterality: N/A;  at bedside  . TONSILLECTOMY     age 42     Family History  Problem Relation Age of Onset  . Heart disease Mother   . Melanoma Mother   . Stroke Mother   . Heart disease Father     MI  . Diabetes Father   . Colon cancer Maternal Uncle   . Colon polyps Maternal Uncle    Social History:  reports that she quit smoking about 4 years ago. Her smoking use included Cigarettes. She has never used smokeless tobacco. She reports that she does not drink alcohol or use drugs.  Allergies: No Known Allergies  Medications:  Scheduled: . aspirin  81 mg Oral Daily  . cholecalciferol  2,000 Units Oral Daily  . ezetimibe  5 mg Oral Daily  . feeding supplement (ENSURE ENLIVE)  237 mL Oral BID BM  . heparin  5,000 Units Subcutaneous Q8H  . insulin aspart  0-9 Units Subcutaneous TID WC  . insulin glargine  20 Units Subcutaneous QHS  . lisinopril  10 mg Oral Daily  . metoprolol succinate  25 mg Oral Daily  . multivitamin with minerals  1 tablet Oral Daily  . pantoprazole  40 mg Oral BID  . simvastatin  20 mg Oral Daily  . sodium chloride flush  3 mL Intravenous Q12H  . sucralfate  1 g Oral TID WC & HS    ROS:                                                                                                                                       History obtained from unobtainable from patient due to neurdegenerative decline  General ROS: negative for - chills, fatigue, fever, night sweats, weight gain or weight loss Psychological ROS: negative for - behavioral disorder, hallucinations, memory difficulties, mood swings or suicidal ideation Ophthalmic ROS: negative for - blurry vision, double vision, eye pain or loss of vision ENT ROS: negative for - epistaxis, nasal discharge, oral lesions, sore throat, tinnitus or vertigo Allergy and Immunology ROS: negative for - hives or itchy/watery eyes Hematological and Lymphatic  ROS: negative for - bleeding problems, bruising or swollen lymph nodes Endocrine ROS: negative for - galactorrhea, hair pattern changes, polydipsia/polyuria or temperature intolerance Respiratory ROS: negative for - cough, hemoptysis, shortness of breath or wheezing Cardiovascular ROS: negative for - chest pain, dyspnea on exertion, edema or irregular heartbeat Gastrointestinal ROS: negative for - abdominal pain, diarrhea, hematemesis, nausea/vomiting or stool incontinence Genito-Urinary ROS: negative for - dysuria, hematuria, incontinence or urinary frequency/urgency Musculoskeletal ROS: negative for - joint swelling or muscular weakness Neurological ROS: as noted in HPI Dermatological ROS: negative for rash and skin lesion changes  Neurologic Examination:                                                                                                      Blood pressure (!) 148/52, pulse 64, temperature 97.3 F (36.3 C), temperature source Oral, resp. rate 16, height 5' 2.5" (1.588 m), weight 61.7 kg (136 lb), SpO2 100 %.  HEENT-  Normocephalic, no lesions, without obvious abnormality.  Normal external eye and conjunctiva.  Normal TM's bilaterally.  Normal auditory canals and external ears. Normal external nose, mucus membranes and septum.  Normal pharynx. Cardiovascular- S1, S2 normal, pulses palpable throughout   Lungs- chest clear, no wheezing, rales, normal symmetric air entry Abdomen- soft, non-tender; bowel sounds normal; no masses,  no organomegaly Extremities- no edema Lymph-no adenopathy palpable Musculoskeletal-no joint tenderness, deformity or swelling Skin-warm and dry, no hyperpigmentation, vitiligo, or suspicious lesions  Neurological Examination Mental Status: Alert, oriented to hospital only.  Speech fluent without evidence of aphasia.  Able to follow 3 step commands without difficulty. Cranial Nerves: II:  Visual fields grossly normal,  III,IV, VI: ptosis not present,  extra-ocular motions intact bilaterally, pupils equal, round, reactive to light and accommodation V,VII: smile symmetric, facial light touch sensation normal bilaterally VIII: hearing normal bilaterally IX,X: uvula rises symmetrically XI: bilateral shoulder shrug XII: midline tongue extension Motor: Moves all extremities 5/5 with UE greater than lower extremities.  Sensory: Pinprick and light touch intact throughout, bilaterally Deep Tendon Reflexes: 1+ and symmetric throughout Plantars: Right: downgoing   Left: up going Cerebellar: normal finger-to-nose with postural tremor Gait: not tested       Lab Results: Basic Metabolic Panel:  Recent Labs Lab 09/14/16 1029 09/15/16 0558  NA 136 140  K 3.8 3.4*  CL 104 106  CO2 23 21*  GLUCOSE 414* 180*  BUN 10 5*  CREATININE 0.76 0.58  CALCIUM 9.1 9.1    Liver Function Tests:  Recent Labs Lab 09/14/16 1029  AST 28  ALT 19  ALKPHOS 66  BILITOT 0.9  PROT 7.0  ALBUMIN 3.6   No results for input(s): LIPASE, AMYLASE in the last 168 hours.  Recent Labs Lab 09/14/16 1851  AMMONIA 21    CBC:  Recent Labs Lab 09/14/16 1029 09/15/16 0558  WBC 5.9 8.5  NEUTROABS 2.8  --   HGB 12.9 13.9  HCT 38.4 41.4  MCV 93.2 91.0  PLT 215 230    Cardiac Enzymes: No results for input(s): CKTOTAL, CKMB, CKMBINDEX, TROPONINI in the last 168 hours.  Lipid Panel: No results for input(s): CHOL, TRIG, HDL, CHOLHDL, VLDL, LDLCALC in the last 168 hours.  CBG:  Recent Labs Lab 09/14/16 1007 09/14/16 1301 09/14/16 1629 09/15/16 0752 09/15/16 1200  GLUCAP 405* 352* 270* 177* 291*    Microbiology: Results for orders placed or performed during the hospital encounter of 09/14/16  MRSA PCR Screening     Status: None   Collection Time: 09/14/16  4:10 PM  Result Value Ref Range Status   MRSA by PCR NEGATIVE NEGATIVE Final    Comment:        The GeneXpert MRSA Assay (FDA approved for NASAL specimens only), is one component  of a comprehensive MRSA colonization surveillance program. It is not intended to diagnose MRSA infection nor to guide or monitor treatment for MRSA infections.     Coagulation Studies:  Recent Labs  09/14/16 1851  LABPROT 13.8  INR 1.06    Imaging: Dg Chest 2 View  Result Date: 09/14/2016 CLINICAL DATA:  76 year old female with confusion. Difficulty walking. EXAM: CHEST  2 VIEW COMPARISON:  Chest x-ray 07/11/2016. FINDINGS: Diffuse peribronchial cuffing. Lung volumes are normal. No consolidative airspace disease. No pleural effusions. No pneumothorax. No pulmonary nodule or mass noted. Pulmonary vasculature and the cardiomediastinal silhouette are within normal limits. Aortic atherosclerosis. IMPRESSION: 1. Diffuse peribronchial cuffing, concerning for an acute bronchitis. 2. Aortic atherosclerosis. Electronically Signed   By: Vinnie Langton M.D.   On: 09/14/2016  11:51   Ct Head Wo Contrast  Result Date: 09/14/2016 CLINICAL DATA:  Altered mental status this morning. Difficulty walking. EXAM: CT HEAD WITHOUT CONTRAST TECHNIQUE: Contiguous axial images were obtained from the base of the skull through the vertex without intravenous contrast. COMPARISON:  06/05/2016 FINDINGS: Brain: Ventricles, cisterns and other CSF spaces are within normal. Mild chronic ischemic microvascular disease is present. There is no mass, mass effect, shift of midline structures or acute hemorrhage. No evidence to suggest acute infarction. Vascular: Calcified plaque over the cavernous segment of the internal carotid arteries. Skull: Within normal. Sinuses/Orbits: Within normal. Other: None. IMPRESSION: No acute intracranial findings. Chronic ischemic microvascular disease. Electronically Signed   By: Marin Olp M.D.   On: 09/14/2016 11:53   Mr Brain Wo Contrast  Result Date: 09/14/2016 CLINICAL DATA:  Dysphasia.  Confusion. EXAM: MRI HEAD WITHOUT CONTRAST TECHNIQUE: Multiplanar, multiecho pulse sequences of  the brain and surrounding structures were obtained without intravenous contrast. COMPARISON:  Head CT 09/14/2016 and MRI 06/06/2016 FINDINGS: The study is motion degraded throughout, moderately so on the axial T1 and coronal T2 sequences. Brain: There is no evidence of acute infarct, intracranial hemorrhage, mass, midline shift, or extra-axial fluid collection. Mild to moderate cerebral atrophy is unchanged. A prominent cisterna magna is noted. Scattered foci of T2 hyperintensity in the subcortical and deep cerebral white matter bilaterally are similar to the prior MRI and nonspecific but compatible with mild-to-moderate chronic small vessel ischemic disease. There is a new 1.2 cm focus of gyral T2 hyperintensity in the right parietal lobe (series 7, image 21) which demonstrates facilitated diffusion. Vascular: Major intracranial vascular flow voids are preserved. Skull and upper cervical spine: Unremarkable bone marrow signal. Sinuses/Orbits: Unremarkable orbits. Clear paranasal sinuses. Small right mastoid effusion. Other: Similar appearance of suspected enlarged lymph nodes in the right upper neck compared to the prior MRI. IMPRESSION: 1. Motion degraded examination.  No acute infarct. 2. New focus of gyral signal abnormality in the right parietal lobe which may reflect a small subacute cortical infarct. 3. Mild to moderate chronic small vessel ischemic disease. Electronically Signed   By: Logan Bores M.D.   On: 09/14/2016 18:57       Assessment and plan discussed with with attending physician and they are in agreement.    Etta Quill PA-C [facility] [phone]  09/15/2016, 1:33 PM   Assessment: 76 y.o. female with possible subacute right parietal stroke. A Carotid doppler has been done >3 months ago and would likely repeat but no need to do Echo. Patient would benefit from a antiplatelet if ok with GI.   Stroke Risk Factors - hyperlipidemia  Recommend: 1. HgbA1c, fasting lipid  panel 2. MRA  of the brain without contrast 3. PT consult, OT consult, Speech consult 4. Echocardiogram 5. Carotid dopplers 6. Prophylactic therapy-Antiplatelet med: Plavix - dose 75 mg if ok with GI 7. Risk factor modification 8. Telemetry monitoring 9. Frequent neuro checks 10 NPO until passes stroke swallow screen 11 please page stroke NP  Or  PA  Or MD from 8am -4 pm  as this patient from this time will be  followed by the stroke.   You can look them up on [email or web address]  Password [email or web address]

## 2016-09-15 NOTE — Progress Notes (Signed)
PROGRESS NOTE    Katie Lozano  AJG:[id number] DOB: December 01, 1940 DOA: 09/14/2016 PCP: Phineas Inches, MD   Brief Narrative:  Katie Lozano is a 76 y.o. female with medical history significant of IDDM, HTN, history of TIA, recent hospitalization of January 2018 secondary to respiratory failure, DKA and esophageal necrosis. Patient was in a nursing home, then discharge to ALF. All patient history obtained from sister at bedside and EDP notes, patient is confused, disoriented and could not provide any meaningful history. Per sister at bedside she started to have confusion about 2 weeks ago, likely not taking her medication, one day she was found sleeping on the floor, this is got progressively worse to the point she couldn't carry any conversation so she brought her to [facility] for further evaluation, her labs looks okay referred for hospital evaluation for encephalopathy/CVA workup.  Subjective: Patient is very confused, but states that she feels ok and is in no pain.   Assessment & Plan:   Principal Problem:   Acute encephalopathy Active Problems:   Type 2 diabetes mellitus with diabetic nephropathy, with long-term current use of insulin (HCC)   Essential hypertension   Esophageal necrosis   Dementia without behavioral disturbance   Acute Encephalopathy. Remains very confused. Unclear Etiology. Possibly explained by MRI findings- no acute infarct, but new focus of gyral signal abnormality in the right parietal lobe which may reflect a small subacute cortical infarct.  Today MMS score of 10. There is no evidence of infection. UA clear, CXR and CT without acute findings, no fever or leukocytosis. Ammonia, TSH, and folate wnl. Consult neurology today. Continue hydration with IV fluids.   Subacute CVA MRI showed subacute infarct, neurologically evaluate. Has history of multiple TIAs, she was on Plavix which was recently discontinued because of recent esophageal necrosis. CVA  workup, I will not repeat carotid Doppler done on 12/17 and echo done on 1/18,  Type 2 diabetes, uncontrolled with hyperglycemia. Improved. Presented with blood glucose of 414. Bicarb was 23- no evidence of acidosis or ketosis. A1C 7.9 (09/14/16). Today BG 177. Continue Lantus 20 units at night and ISS. Monitor CBG before meals and at bedtime.  Hypokalemia. Potassium 3.4 today. Potassium supplement today and recheck in am.   Recent history of GI bleed and esophageal necrosis. Appears to be stable, no complaints of any epigastric abdominal pain, hemoglobin of 12.9. Continue PPIs and Carafate, recommend follow-up EGD as outpatient.  Essential Hypertension. Stable, but systolic is still high. Continue lisinopril and metoprolol.   Dementia without behavioral disturbances. On admission, sister at bedside reported she had memory problems before this acute worsening of cognitive function. She has risk factor for vascular dementia, diabetes, hypertension and history of recurrent TIAs. LDL 12/67/17  is 48. MRI shows mild/moderate small vessel ischemia disease.   History of TIAs. Continues to have nonsensical speech. MRI as above. Continue low-dose aspirin per last recommendation from Dr. Erlinda Hong office visit on 09/02/16.    DVT prophylaxis: SQ Heparin Code Status: Full Family Communication: No family at bedside. Difficult to obtain any history from patient due to confusion.  Disposition Plan: tbd   Consultants:   Neurology  Procedures:   None  Antimicrobials:   None   Objective: Vitals:   09/14/16 1614 09/14/16 1856 09/14/16 2119 09/15/16 0416  BP: (!) 161/55 (!) 173/89 (!) 168/64 (!) 174/69  Pulse: 65 75 73 66  Resp: 20  14 15   Temp: 98.3 F (36.8 C)  98.8 F (37.1 C)  99 F (37.2 C)  TempSrc: Oral     SpO2: 99%  93% 93%  Weight:    61.7 kg (136 lb)  Height:  5' 2.5" (1.588 m)      Intake/Output Summary (Last 24 hours) at 09/15/16 0915 Last data filed at 09/15/16 0600  Gross per 24  hour  Intake             1780 ml  Output              400 ml  Net             1380 ml   Filed Weights   09/14/16 1003 09/15/16 0416  Weight: 65.3 kg (144 lb) 61.7 kg (136 lb)    Examination:  General exam: Appears calm and comfortable  Respiratory system: Clear to auscultation. Respiratory effort normal. Cardiovascular system: S1 & S2 heard, RRR. No JVD, murmurs, rubs, gallops or clicks. 1+ pedal edema. Gastrointestinal system: Abdomen is nondistended, soft and nontender. No organomegaly or masses felt. Central nervous system: Alert and oriented. No focal neurological deficits. Extremities: Symmetric 5 x 5 power. Skin: No rashes, lesions or ulcers Psychiatry: Patient is very confused. Scored 10 on MMS exam.    Data Reviewed: I have personally reviewed following labs and imaging studies  CBC:  Recent Labs Lab 09/14/16 1029 09/15/16 0558  WBC 5.9 8.5  NEUTROABS 2.8  --   HGB 12.9 13.9  HCT 38.4 41.4  MCV 93.2 91.0  PLT 215 465   Basic Metabolic Panel:  Recent Labs Lab 09/14/16 1029 09/15/16 0558  NA 136 140  K 3.8 3.4*  CL 104 106  CO2 23 21*  GLUCOSE 414* 180*  BUN 10 5*  CREATININE 0.76 0.58  CALCIUM 9.1 9.1   GFR: Estimated Creatinine Clearance: 51.6 mL/min (by C-G formula based on SCr of 0.58 mg/dL). Liver Function Tests:  Recent Labs Lab 09/14/16 1029  AST 28  ALT 19  ALKPHOS 66  BILITOT 0.9  PROT 7.0  ALBUMIN 3.6   No results for input(s): LIPASE, AMYLASE in the last 168 hours.  Recent Labs Lab 09/14/16 1851  AMMONIA 21   Coagulation Profile:  Recent Labs Lab 09/14/16 1851  INR 1.06   Cardiac Enzymes: No results for input(s): CKTOTAL, CKMB, CKMBINDEX, TROPONINI in the last 168 hours. BNP (last 3 results) No results for input(s): PROBNP in the last 8760 hours. HbA1C: No results for input(s): HGBA1C in the last 72 hours. CBG:  Recent Labs Lab 09/14/16 1007 09/14/16 1301 09/14/16 1629 09/15/16 0752  GLUCAP 405* 352* 270*  177*   Lipid Profile: No results for input(s): CHOL, HDL, LDLCALC, TRIG, CHOLHDL, LDLDIRECT in the last 72 hours. Thyroid Function Tests:  Recent Labs  09/14/16 1851  TSH 2.036   Anemia Panel:  Recent Labs  09/14/16 1851  VITAMINB12 569  FOLATE 29.9   Urine analysis:    Component Value Date/Time   COLORURINE YELLOW 09/14/2016 1150   APPEARANCEUR CLEAR 09/14/2016 1150   LABSPEC 1.027 09/14/2016 1150   PHURINE 6.0 09/14/2016 1150   GLUCOSEU >=500 (A) 09/14/2016 1150   HGBUR TRACE (A) 09/14/2016 1150   BILIRUBINUR NEGATIVE 09/14/2016 1150   KETONESUR 15 (A) 09/14/2016 1150   PROTEINUR NEGATIVE 09/14/2016 1150   NITRITE NEGATIVE 09/14/2016 1150   LEUKOCYTESUR NEGATIVE 09/14/2016 1150   Sepsis Labs: @LABRCNTIP (procalcitonin:4,lacticidven:4)  ) Recent Results (from the past 240 hour(s))  MRSA PCR Screening     Status: None   Collection Time: 09/14/16  4:10 PM  Result Value Ref Range Status   MRSA by PCR NEGATIVE NEGATIVE Final    Comment:        The GeneXpert MRSA Assay (FDA approved for NASAL specimens only), is one component of a comprehensive MRSA colonization surveillance program. It is not intended to diagnose MRSA infection nor to guide or monitor treatment for MRSA infections.          Radiology Studies: Dg Chest 2 View  Result Date: 09/14/2016 CLINICAL DATA:  76 year old female with confusion. Difficulty walking. EXAM: CHEST  2 VIEW COMPARISON:  Chest x-ray 07/11/2016. FINDINGS: Diffuse peribronchial cuffing. Lung volumes are normal. No consolidative airspace disease. No pleural effusions. No pneumothorax. No pulmonary nodule or mass noted. Pulmonary vasculature and the cardiomediastinal silhouette are within normal limits. Aortic atherosclerosis. IMPRESSION: 1. Diffuse peribronchial cuffing, concerning for an acute bronchitis. 2. Aortic atherosclerosis. Electronically Signed   By: Vinnie Langton M.D.   On: 09/14/2016 11:51   Ct Head Wo  Contrast  Result Date: 09/14/2016 CLINICAL DATA:  Altered mental status this morning. Difficulty walking. EXAM: CT HEAD WITHOUT CONTRAST TECHNIQUE: Contiguous axial images were obtained from the base of the skull through the vertex without intravenous contrast. COMPARISON:  06/05/2016 FINDINGS: Brain: Ventricles, cisterns and other CSF spaces are within normal. Mild chronic ischemic microvascular disease is present. There is no mass, mass effect, shift of midline structures or acute hemorrhage. No evidence to suggest acute infarction. Vascular: Calcified plaque over the cavernous segment of the internal carotid arteries. Skull: Within normal. Sinuses/Orbits: Within normal. Other: None. IMPRESSION: No acute intracranial findings. Chronic ischemic microvascular disease. Electronically Signed   By: Marin Olp M.D.   On: 09/14/2016 11:53   Mr Brain Wo Contrast  Result Date: 09/14/2016 CLINICAL DATA:  Dysphasia.  Confusion. EXAM: MRI HEAD WITHOUT CONTRAST TECHNIQUE: Multiplanar, multiecho pulse sequences of the brain and surrounding structures were obtained without intravenous contrast. COMPARISON:  Head CT 09/14/2016 and MRI 06/06/2016 FINDINGS: The study is motion degraded throughout, moderately so on the axial T1 and coronal T2 sequences. Brain: There is no evidence of acute infarct, intracranial hemorrhage, mass, midline shift, or extra-axial fluid collection. Mild to moderate cerebral atrophy is unchanged. A prominent cisterna magna is noted. Scattered foci of T2 hyperintensity in the subcortical and deep cerebral white matter bilaterally are similar to the prior MRI and nonspecific but compatible with mild-to-moderate chronic small vessel ischemic disease. There is a new 1.2 cm focus of gyral T2 hyperintensity in the right parietal lobe (series 7, image 21) which demonstrates facilitated diffusion. Vascular: Major intracranial vascular flow voids are preserved. Skull and upper cervical spine: Unremarkable  bone marrow signal. Sinuses/Orbits: Unremarkable orbits. Clear paranasal sinuses. Small right mastoid effusion. Other: Similar appearance of suspected enlarged lymph nodes in the right upper neck compared to the prior MRI. IMPRESSION: 1. Motion degraded examination.  No acute infarct. 2. New focus of gyral signal abnormality in the right parietal lobe which may reflect a small subacute cortical infarct. 3. Mild to moderate chronic small vessel ischemic disease. Electronically Signed   By: Logan Bores M.D.   On: 09/14/2016 18:57        Scheduled Meds: . aspirin  81 mg Oral Daily  . cholecalciferol  2,000 Units Oral Daily  . ezetimibe  5 mg Oral Daily  . feeding supplement (ENSURE ENLIVE)  237 mL Oral BID BM  . heparin  5,000 Units Subcutaneous Q8H  . insulin aspart  0-9 Units Subcutaneous TID WC  .  insulin glargine  20 Units Subcutaneous QHS  . lisinopril  10 mg Oral Daily  . metoprolol succinate  25 mg Oral Daily  . multivitamin with minerals  1 tablet Oral Daily  . pantoprazole  40 mg Oral BID  . simvastatin  20 mg Oral Daily  . sodium chloride flush  3 mL Intravenous Q12H  . sucralfate  1 g Oral TID WC & HS   Continuous Infusions: . sodium chloride 75 mL/hr at 09/15/16 0817     LOS: 1 day    Time spent: 25 minutes   Aliene Altes, PA-S [facility] 09/15/2016   If 7PM-7AM, please contact night-coverage [email or web address] Password [email or web address] 09/15/2016, 9:15 AM   Birdie Hopes Pager: [phone] 09/15/2016, 11:55 AM

## 2016-09-15 NOTE — Progress Notes (Signed)
Initial Nutrition Assessment  DOCUMENTATION CODES:   Severe malnutrition in context of acute illness/injury  INTERVENTION:  - Continue Ensure BID, each supplement provides 350 calories and 20 grams protein  NUTRITION DIAGNOSIS:   Malnutrition related to acute illness as evidenced by moderate depletions of muscle mass, percent weight loss.  GOAL:   Patient will meet greater than or equal to 90% of their needs  MONITOR:   PO intake, Supplement acceptance, Weight trends, Labs, I & O's  REASON FOR ASSESSMENT:   Malnutrition Screening Tool    ASSESSMENT:   76 y.o. Female with PMH of IDDM, HTN, hx of TIA, GERD, Anemia, Diverticulitis, Hyperlipidemia, recent hospitalization of January 2018 secondary to respiratory failure, DKA and esophageal necrosis. Pt was in a nursing home, but discharged to ALF, presents with acute encephalopathy. Pt likely not taking her medication.  Pt's sister at bedside and assisted in collecting pt's nutritional history as pt was confused at time of visit.  Pt's sister reports pt has lost 20 lbs in less than three months between hospital admissions. Pt states a UBW may be around 155 but is unsure. Per chart review pt has experience fluctuations in weight status over the past year. Per weights pt lost 23 lbs between 07/15/16 and 09/15/16 (14.5% in < 3 months- significant for time frame)  Wt Readings from Last 10 Encounters:  09/15/16 136 lb (61.7 kg)  09/14/16 144 lb (65.3 kg)  09/02/16 144 lb 6.4 oz (65.5 kg)  08/20/16 144 lb 3.2 oz (65.4 kg)  08/01/16 159 lb (72.1 kg)  07/28/16 159 lb (72.1 kg)  07/23/16 159 lb (72.1 kg)  07/15/16 159 lb 8 oz (72.3 kg)  06/06/16 151 lb 7.3 oz (68.7 kg)  09/04/15 149 lb (67.6 kg)   Per chart review pt has had meal completion for three meals recorded, they all were 0% completed. Per pt and pt's sister her appetite is coming back. Per pt's sister she ate well at dinner and pt was consuming lunch a good portion of lunch at  time of visit   Pt was provided Ensure this morning. Per discussion with RN, pt drank it all.   Labs reviewed; CBG (177-405), K (3.4), A1C (7.9) Medications reviewed; Vitamin D, Sliding scale insulin, Lantus, Multivitamin with minerals, Protonix, Carafate  Nutrition-focused physical exam completed. Findings include no fat depletion, mild to moderate muscle depletion and no edema.  Diet Order:  Diet Carb Modified Fluid consistency: Thin; Room service appropriate? Yes  Skin:  Reviewed, no issues  Last BM:  3/18  Height:   Ht Readings from Last 1 Encounters:  09/14/16 5' 2.5" (1.588 m)    Weight:   Wt Readings from Last 1 Encounters:  09/15/16 136 lb (61.7 kg)    Ideal Body Weight:  51 kg  BMI:  Body mass index is 24.48 kg/m.  Estimated Nutritional Needs:   Kcal:  1500-1700  Protein:  75-85 grams  Fluid:  >/= 1.5 L/d  EDUCATION NEEDS:   Education needs no appropriate at this time  [employer]

## 2016-09-15 NOTE — Evaluation (Signed)
Occupational Therapy Evaluation Patient Details Name: Katie Lozano MRN: [id number] DOB: 04-Dec-1940 Today's Date: 09/15/2016    History of Present Illness 76 yo female with new admit for acute encephalopathy with bronchitis, PMHx:  GI bleed, DKA, AKI, NSTEMI with shock, erosive gastritis, esophageal necrosis, dementia, DM, HTN,    Clinical Impression   PLOF and living arrangement difficult to assess due to pt confusing and presenting as a poor historian. Currently, pt disoriented to place, DOB, and situation. Pt requires Max VCs to perform ADLs and functional mobility. Pt's occupational performance and participation is impacted by decreased cognition such as attempting to don sock on chin. Pt would benefit from continued acute OT to facilitate progress towards goals and safe dc. Recommend dc to SNF for further OT to increase pt's independence in ADLs and functional mobility.     Follow Up Recommendations  SNF;Supervision/Assistance - 24 hour    Equipment Recommendations  Other (comment) (Defer to next venue)    Recommendations for Other Services       Precautions / Restrictions Precautions Precautions: Fall (Telemetry) Restrictions Weight Bearing Restrictions: No      Mobility Bed Mobility   General bed mobility comments: In recliner upon arrival  Transfers Overall transfer level: Needs assistance Equipment used: Rolling walker (2 wheeled) Transfers: Sit to/from Stand Sit to Stand: Min assist        General transfer comment: Requires Max VCs for RW managmenet, hand placement, and sequencing    Balance Overall balance assessment: Needs assistance Sitting-balance support: Feet supported;Bilateral upper extremity supported Sitting balance-Leahy Scale: Fair   Postural control: Posterior lean Standing balance support: Bilateral upper extremity supported;During functional activity Standing balance-Leahy Scale: Fair Standing balance comment: pt able to maintain static  balance at sink for grooming                            ADL Overall ADL's : Needs assistance/impaired     Grooming: Wash/dry face;Minimal assistance;Cueing for sequencing;Standing Grooming Details (indicate cue type and reason): Pt washed face at sink and required cues for initation and termination of task Upper Body Bathing: Minimal assistance;Sitting   Lower Body Bathing: Minimal assistance;Sitting/lateral leans   Upper Body Dressing : Moderate assistance;Sitting   Lower Body Dressing: Maximal assistance;Sitting/lateral leans;Cueing for sequencing Lower Body Dressing Details (indicate cue type and reason): When asked to don socks, pt attempted to put sock on chin Toilet Transfer: Minimal assistance;Ambulation;RW Toilet Transfer Details (indicate cue type and reason): simulated to chair         Functional mobility during ADLs: Minimal assistance;+2 for safety/equipment General ADL Comments: Pt demonstrated poor performance of ADLs due to decreased cogntition. She demonstrated good ROM to reach down towards sock or stand at sink, but unable to sequence tasks     Vision         Perception     Praxis      Pertinent Vitals/Pain Pain Assessment: Faces Faces Pain Scale: No hurt     Hand Dominance Right   Extremity/Trunk Assessment Upper Extremity Assessment Upper Extremity Assessment: Generalized weakness   Lower Extremity Assessment Lower Extremity Assessment: Generalized weakness   Cervical / Trunk Assessment Cervical / Trunk Assessment: Normal   Communication Communication Communication: Expressive difficulties;Receptive difficulties   Cognition Arousal/Alertness: Awake/alert Behavior During Therapy: Impulsive Overall Cognitive Status: History of cognitive impairments - at baseline Area of Impairment: Orientation;Safety/judgement;Following commands;Problem solving;Attention Orientation Level: Disoriented to;Person;Place;Time;Situation (Unable to  recall DOB) Current Attention Level:  Focused   Following Commands: Follows one step commands inconsistently Safety/Judgement: Decreased awareness of deficits;Decreased awareness of safety   Problem Solving: Slow processing;Requires verbal cues;Requires tactile cues;Decreased initiation;Difficulty sequencing General Comments: Pt's decreased cogntition impacted her performance in ADLs and functional mobility   General Comments  Vital signs: Seated SpO2 95; HR 66                    After activity: SpO2 97; HR 65; BP 156/52    Exercises       Shoulder Instructions      Home Living Family/patient expects to be discharged to:: Skilled nursing facility                                        Prior Functioning/Environment Level of Independence: Needs assistance per PT note Gait / Transfers Assistance Needed: walked with walker and assistance at ALF, drove and I prior to Jan 2018 hospitalization ADL's / Homemaking Assistance Needed: ALF to assist all cooking and cleaning   Comments: During OT eval, pt unable to provide PLOF as a poor historian due to increased confusion        OT Problem List: Impaired balance (sitting and/or standing);Decreased activity tolerance;Decreased safety awareness;Decreased cognition;Decreased knowledge of use of DME or AE;Decreased coordination      OT Treatment/Interventions: Self-care/ADL training;Therapeutic exercise;Energy conservation;DME and/or AE instruction;Therapeutic activities;Cognitive remediation/compensation;Patient/family education    OT Goals(Current goals can be found in the care plan section) Acute Rehab OT Goals Patient Stated Goal: Not stated ADL Goals Pt Will Perform Grooming: with supervision;standing (with Min VCs) Pt Will Perform Upper Body Dressing: with min assist;sitting (with Min VCs) Pt Will Perform Lower Body Dressing: with min assist;sit to/from stand (with Min VCs) Pt Will Transfer to Toilet: with  supervision;bedside commode;ambulating  OT Frequency: Min 2X/week   Barriers to D/C:            Co-evaluation              End of Session Equipment Utilized During Treatment: Gait belt;Rolling walker Nurse Communication: Mobility status  Activity Tolerance: Other (comment);Patient tolerated treatment well (Limited to decreased cogntition) Patient left: in chair;with call bell/phone within reach;with chair alarm set  OT Visit Diagnosis: Unsteadiness on feet (R26.81);Muscle weakness (generalized) (M62.81);Other symptoms and signs involving cognitive function                ADL either performed or assessed with clinical judgement  Time: 1450-1518 OT Time Calculation (min): 28 min Charges:  OT General Charges $OT Visit: 1 Procedure OT Evaluation $OT Eval Moderate Complexity: 1 Procedure OT Treatments $Self Care/Home Management : 8-22 mins G-Codes: OT G-codes **NOT FOR INPATIENT CLASS** Functional Assessment Tool Used: Clinical judgement Functional Limitation: Self care Self Care Current Status (J5009): At least 60 percent but less than 80 percent impaired, limited or restricted Self Care Goal Status (F8182): At least 40 percent but less than 60 percent impaired, limited or restricted   [facility], OTR/L [place] 09/15/2016, 3:47 PM

## 2016-09-16 ENCOUNTER — Inpatient Hospital Stay (HOSPITAL_COMMUNITY): Payer: PPO

## 2016-09-16 DIAGNOSIS — I6340 Cerebral infarction due to embolism of unspecified cerebral artery: Secondary | ICD-10-CM | POA: Insufficient documentation

## 2016-09-16 DIAGNOSIS — E43 Unspecified severe protein-calorie malnutrition: Secondary | ICD-10-CM

## 2016-09-16 LAB — GLUCOSE, CAPILLARY
GLUCOSE-CAPILLARY: 243 mg/dL — AB (ref 65–99)
GLUCOSE-CAPILLARY: 349 mg/dL — AB (ref 65–99)
Glucose-Capillary: 286 mg/dL — ABNORMAL HIGH (ref 65–99)
Glucose-Capillary: 206 mg/dL — ABNORMAL HIGH (ref 65–99)

## 2016-09-16 LAB — LIPID PANEL
CHOLESTEROL: 107 mg/dL (ref 0–200)
HDL: 26 mg/dL — ABNORMAL LOW (ref >40)
LDL Cholesterol: 41 mg/dL (ref 0–99)
TRIGLYCERIDES: 200 mg/dL — AB (ref <150)
Total CHOL/HDL Ratio: 4.1 RATIO
VLDL: 40 mg/dL (ref 0–40)

## 2016-09-16 LAB — CBC
HCT: 37.3 % (ref 36.0–46.0)
HEMOGLOBIN: 12.5 g/dL (ref 12.0–15.0)
MCH: 30.7 pg (ref 26.0–34.0)
MCHC: 33.5 g/dL (ref 30.0–36.0)
MCV: 91.6 fL (ref 78.0–100.0)
PLATELETS: 188 10*3/uL (ref 150–400)
RBC: 4.07 MIL/uL (ref 3.87–5.11)
RDW: 13.9 % (ref 11.5–15.5)
WBC: 7.3 10*3/uL (ref 4.0–10.5)

## 2016-09-16 LAB — BASIC METABOLIC PANEL
Anion gap: 11 (ref 5–15)
BUN: 9 mg/dL (ref 6–20)
CALCIUM: 8.8 mg/dL — AB (ref 8.9–10.3)
CO2: 21 mmol/L — AB (ref 22–32)
CREATININE: 0.7 mg/dL (ref 0.44–1.00)
Chloride: 105 mmol/L (ref 101–111)
GFR calc Af Amer: >60 mL/min (ref >60)
GLUCOSE: 301 mg/dL — AB (ref 65–99)
Potassium: 3.3 mmol/L — ABNORMAL LOW (ref 3.5–5.1)
Sodium: 137 mmol/L (ref 135–145)

## 2016-09-16 MED ORDER — INSULIN GLARGINE 100 UNIT/ML ~~LOC~~ SOLN
25.0000 [IU] | Freq: Every day | SUBCUTANEOUS | Status: DC
  Administered 2016-09-16: 25 [IU] via SUBCUTANEOUS
  Filled 2016-09-16: qty 0.25

## 2016-09-16 MED ORDER — IOPAMIDOL (ISOVUE-370) INJECTION 76%
INTRAVENOUS | Status: AC
  Administered 2016-09-16: 50 mL
  Filled 2016-09-16: qty 50

## 2016-09-16 MED ORDER — POTASSIUM CHLORIDE CRYS ER 20 MEQ PO TBCR
40.0000 meq | EXTENDED_RELEASE_TABLET | Freq: Four times a day (QID) | ORAL | Status: AC
  Administered 2016-09-16 (×2): 40 meq via ORAL
  Filled 2016-09-16 (×2): qty 2

## 2016-09-16 NOTE — Progress Notes (Signed)
EEG completed, results pending. 

## 2016-09-16 NOTE — Progress Notes (Addendum)
PROGRESS NOTE    Katie Lozano  HFW:[id number] DOB: 06-Jun-1941 DOA: 09/14/2016 PCP: Phineas Inches, MD   Brief Narrative:  Katie Lozano is a 76 y.o. female with medical history significant of IDDM, HTN, history of TIA, recent hospitalization of January 2018 secondary to respiratory failure, DKA and esophageal necrosis. Patient was in a nursing home, then discharge to ALF. All patient history obtained from sister at bedside and EDP notes, patient is confused, disoriented and could not provide any meaningful history. Per sister at bedside she started to have confusion about 2 weeks ago, likely not taking her medication, one day she was found sleeping on the floor, this is got progressively worse to the point she couldn't carry any conversation so she brought her to [facility] for further evaluation, her labs looks okay referred for hospital evaluation for encephalopathy/CVA workup. Await recommendation from stroke, likely can be discharged in a.m. To SNF.  Subjective: Still confused and disoriented, oriented only to self, speech is clear but nonsensical.  Assessment & Plan:   Principal Problem:   Acute encephalopathy Active Problems:   Type 2 diabetes mellitus with diabetic nephropathy, with long-term current use of insulin (HCC)   Essential hypertension   Esophageal necrosis   Dementia without behavioral disturbance   Protein-calorie malnutrition, severe   Cerebral embolism with cerebral infarction   Acute Encephalopathy Remains very confused. Unclear Etiology.  Possibly explained by MRI findings- no acute infarct, but new focus of gyral signal abnormality in the right parietal lobe which may reflect a small subacute cortical infarct.   Awake, alert, oriented only to self, this is might going to be her new baseline, she will need SNF on discharge.  Subacute CVA MRI showed subacute infarct, neurologically evaluate. Has history of multiple TIAs, she was on Plavix which was  recently discontinued because of recent esophageal necrosis. CVA workup, I will not repeat carotid Doppler done on 12/17 and echo done on 1/18. Await stroke team recommendation. PT/OT recommended SNF. CSW consulted.  Type 2 diabetes, uncontrolled with hyperglycemia.  Presented with blood glucose of 414. Bicarb was 23- no evidence of acidosis or ketosis. A1C 7.9 (09/14/16). Lantus was 20 units at night, increased to 25 units as still has high fasting blood glucose.  Hypokalemia. Potassium 3.4 today. Replete with oral supplements recheck in a.m.  Recent history of GI bleed and esophageal necrosis. Appears to be stable, no complaints of any epigastric abdominal pain, hemoglobin of 12.9. Continue PPIs and Carafate, recommend follow-up EGD as outpatient.  Essential Hypertension. Stable, but systolic is still high. Continue lisinopril and metoprolol.   Dementia without behavioral disturbances. On admission, sister at bedside reported she had memory problems before this acute worsening of cognitive function. She has risk factor for vascular dementia, diabetes, hypertension and history of recurrent TIAs. LDL 12/67/17  is 48. MRI shows mild/moderate small vessel ischemia disease.   History of TIAs.  Continues to have nonsensical speech. MRI as above. Continue low-dose aspirin per last recommendation from Dr. Erlinda Hong office visit on 09/02/16.    DVT prophylaxis: SQ Heparin Code Status: Full Family Communication: No family at bedside. Difficult to obtain any history from patient due to confusion.  Disposition Plan: tbd   Consultants:   Neurology  Procedures:   None  Antimicrobials:   None   Objective: Vitals:   09/15/16 2330 09/16/16 0330 09/16/16 0640 09/16/16 1000  BP: 108/62 (!) 171/52 (!) 141/74 (!) 144/52  Pulse: (!) 59 73 74 77  Resp:  16 18 16 18   Temp: 97.7 F (36.5 C) 97.8 F (36.6 C) 97.9 F (36.6 C) 98.4 F (36.9 C)  TempSrc: Oral Oral Oral Oral  SpO2: 97% 95% 96% 93%    Weight:      Height:        Intake/Output Summary (Last 24 hours) at 09/16/16 1227 Last data filed at 09/16/16 0900  Gross per 24 hour  Intake              480 ml  Output              401 ml  Net               79 ml   Filed Weights   09/14/16 1003 09/15/16 0416  Weight: 65.3 kg (144 lb) 61.7 kg (136 lb)    Examination:  General exam: Appears calm and comfortable  Respiratory system: Clear to auscultation. Respiratory effort normal. Cardiovascular system: S1 & S2 heard, RRR. No JVD, murmurs, rubs, gallops or clicks. 1+ pedal edema. Gastrointestinal system: Abdomen is nondistended, soft and nontender. No organomegaly or masses felt. Central nervous system: Alert and oriented. No focal neurological deficits. Extremities: Symmetric 5 x 5 power. Skin: No rashes, lesions or ulcers Psychiatry: Patient is very confused. Scored 10 on MMS exam.    Data Reviewed: I have personally reviewed following labs and imaging studies  CBC:  Recent Labs Lab 09/14/16 1029 09/15/16 0558 09/16/16 0511  WBC 5.9 8.5 7.3  NEUTROABS 2.8  --   --   HGB 12.9 13.9 12.5  HCT 38.4 41.4 37.3  MCV 93.2 91.0 91.6  PLT 215 230 944   Basic Metabolic Panel:  Recent Labs Lab 09/14/16 1029 09/15/16 0558 09/16/16 0511  NA 136 140 137  K 3.8 3.4* 3.3*  CL 104 106 105  CO2 23 21* 21*  GLUCOSE 414* 180* 301*  BUN 10 5* 9  CREATININE 0.76 0.58 0.70  CALCIUM 9.1 9.1 8.8*   GFR: Estimated Creatinine Clearance: 51.6 mL/min (by C-G formula based on SCr of 0.7 mg/dL). Liver Function Tests:  Recent Labs Lab 09/14/16 1029  AST 28  ALT 19  ALKPHOS 66  BILITOT 0.9  PROT 7.0  ALBUMIN 3.6   No results for input(s): LIPASE, AMYLASE in the last 168 hours.  Recent Labs Lab 09/14/16 1851  AMMONIA 21   Coagulation Profile:  Recent Labs Lab 09/14/16 1851  INR 1.06   Cardiac Enzymes: No results for input(s): CKTOTAL, CKMB, CKMBINDEX, TROPONINI in the last 168 hours. BNP (last 3  results) No results for input(s): PROBNP in the last 8760 hours. HbA1C:  Recent Labs  09/14/16 1851  HGBA1C 7.9*   CBG:  Recent Labs Lab 09/15/16 1200 09/15/16 1645 09/15/16 2132 09/16/16 0745 09/16/16 1216  GLUCAP 291* 351* 281* 286* 243*   Lipid Profile:  Recent Labs  09/16/16 0511  CHOL 107  HDL 26*  LDLCALC 41  TRIG 200*  CHOLHDL 4.1   Thyroid Function Tests:  Recent Labs  09/14/16 1851  TSH 2.036   Anemia Panel:  Recent Labs  09/14/16 1851  VITAMINB12 569  FOLATE 29.9   Urine analysis:    Component Value Date/Time   COLORURINE YELLOW 09/14/2016 1150   APPEARANCEUR CLEAR 09/14/2016 1150   LABSPEC 1.027 09/14/2016 1150   PHURINE 6.0 09/14/2016 1150   GLUCOSEU >=500 (A) 09/14/2016 1150   HGBUR TRACE (A) 09/14/2016 1150   BILIRUBINUR NEGATIVE 09/14/2016 1150   KETONESUR 15 (A) 09/14/2016 1150  PROTEINUR NEGATIVE 09/14/2016 1150   NITRITE NEGATIVE 09/14/2016 1150   LEUKOCYTESUR NEGATIVE 09/14/2016 1150   Sepsis Labs: @LABRCNTIP (procalcitonin:4,lacticidven:4)  ) Recent Results (from the past 240 hour(s))  MRSA PCR Screening     Status: None   Collection Time: 09/14/16  4:10 PM  Result Value Ref Range Status   MRSA by PCR NEGATIVE NEGATIVE Final    Comment:        The GeneXpert MRSA Assay (FDA approved for NASAL specimens only), is one component of a comprehensive MRSA colonization surveillance program. It is not intended to diagnose MRSA infection nor to guide or monitor treatment for MRSA infections.          Radiology Studies: Mr Brain Wo Contrast  Result Date: 09/14/2016 CLINICAL DATA:  Dysphasia.  Confusion. EXAM: MRI HEAD WITHOUT CONTRAST TECHNIQUE: Multiplanar, multiecho pulse sequences of the brain and surrounding structures were obtained without intravenous contrast. COMPARISON:  Head CT 09/14/2016 and MRI 06/06/2016 FINDINGS: The study is motion degraded throughout, moderately so on the axial T1 and coronal T2  sequences. Brain: There is no evidence of acute infarct, intracranial hemorrhage, mass, midline shift, or extra-axial fluid collection. Mild to moderate cerebral atrophy is unchanged. A prominent cisterna magna is noted. Scattered foci of T2 hyperintensity in the subcortical and deep cerebral white matter bilaterally are similar to the prior MRI and nonspecific but compatible with mild-to-moderate chronic small vessel ischemic disease. There is a new 1.2 cm focus of gyral T2 hyperintensity in the right parietal lobe (series 7, image 21) which demonstrates facilitated diffusion. Vascular: Major intracranial vascular flow voids are preserved. Skull and upper cervical spine: Unremarkable bone marrow signal. Sinuses/Orbits: Unremarkable orbits. Clear paranasal sinuses. Small right mastoid effusion. Other: Similar appearance of suspected enlarged lymph nodes in the right upper neck compared to the prior MRI. IMPRESSION: 1. Motion degraded examination.  No acute infarct. 2. New focus of gyral signal abnormality in the right parietal lobe which may reflect a small subacute cortical infarct. 3. Mild to moderate chronic small vessel ischemic disease. Electronically Signed   By: Logan Bores M.D.   On: 09/14/2016 18:57        Scheduled Meds: . aspirin  81 mg Oral Daily  . cholecalciferol  2,000 Units Oral Daily  . ezetimibe  5 mg Oral Daily  . feeding supplement (ENSURE ENLIVE)  237 mL Oral BID BM  . heparin  5,000 Units Subcutaneous Q8H  . insulin aspart  0-9 Units Subcutaneous TID WC  . insulin glargine  20 Units Subcutaneous QHS  . lisinopril  10 mg Oral Daily  . metoprolol succinate  25 mg Oral Daily  . multivitamin with minerals  1 tablet Oral Daily  . pantoprazole  40 mg Oral BID  . potassium chloride  40 mEq Oral Q6H  . simvastatin  20 mg Oral Daily  . sodium chloride flush  3 mL Intravenous Q12H  . sucralfate  1 g Oral TID WC & HS   Continuous Infusions:    LOS: 2 days    Time spent: 25  minutes   If 7PM-7AM, please contact night-coverage [email or web address] Password [email or web address] 09/16/2016, 12:27 PM   Birdie Hopes, MD Pager: [phone] 09/16/2016, 12:27 PM

## 2016-09-16 NOTE — NC FL2 (Signed)
[place] MEDICAID FL2 LEVEL OF CARE SCREENING TOOL     IDENTIFICATION  Patient Name: Katie Lozano Birthdate: [birth year] Sex: female Admission Date (Current Location): 09/14/2016  [facility] and [place] Number:  Herbalist and Address:  [facility] [place]. [facility], [place] [address] [place], [place] [address]      Provider Number: [id number]  Attending Physician Name and Address:  Verlee Monte, MD  Relative Name and Phone Number:  Bo Mcclintock - Sister; [phone] (mobile)    Current Level of Care: Hospital Recommended Level of Care: [place] Prior Approval Number:    Date Approved/Denied:   PASRR Number: [id number] (Eff. 07/17/16)  Discharge Plan: SNF (Patient from ALF)    Current Diagnoses: Patient Active Problem List   Diagnosis Date Noted  . Cerebral embolism with cerebral infarction 09/16/2016  . Protein-calorie malnutrition, severe 09/15/2016  . Acute encephalopathy 09/14/2016  . Dementia without behavioral disturbance 09/14/2016  . 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 Encephalopathy 09/02/2016  . Diabetes mellitus ([place]) 09/02/2016  . Anemia 09/02/2016  . 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 NSTEMI (non-ST elevated myocardial infarction) ([place]) 07/22/2016  . Pressure injury of skin 07/17/2016  . Encounter for central line placement   . Metabolic acidosis   . AKI (acute kidney injury) ([place])   . UGIB (upper gastrointestinal bleed)   . Melena   . Acute blood loss anemia   . Diabetic ketoacidosis without coma associated with type 2 diabetes mellitus ([place]) 07/11/2016  . Hypovolemic shock ([place])   . Essential hypertension 06/06/2016  . Hypokalemia 06/06/2016  . CKD (chronic kidney disease), stage III 06/06/2016  . Carotid artery syndrome 06/05/2016  . Type 2 diabetes mellitus with diabetic nephropathy, with long-term current use of insulin ([place]) 03/30/2015  . History of colonic polyps 03/30/2015  .  Hyperlipidemia 03/30/2015    Orientation RESPIRATION BLADDER Height & Weight      (Disoriented x4)  Normal Incontinent Weight: 136 lb (61.7 kg) Height:  5' 2.5" (158.8 cm)  BEHAVIORAL SYMPTOMS/MOOD NEUROLOGICAL BOWEL NUTRITION STATUS      Continent Diet (Carb modified)  AMBULATORY STATUS COMMUNICATION OF NEEDS Skin   Extensive Assist Verbally Normal                       Personal Care Assistance Level of Assistance  Bathing, Feeding, Dressing Bathing Assistance: Limited assistance Feeding assistance: Limited assistance (assistance with set-up) Dressing Assistance: Maximum assistance     Functional Limitations Info  Sight, Hearing, Speech Sight Info: Adequate Hearing Info: Adequate Speech Info: Adequate    SPECIAL CARE FACTORS FREQUENCY  PT (By licensed PT), OT (By licensed OT)     PT Frequency: Evaluated 3/19 and a minimum of 3X per week therapy recommended OT Frequency: Evaluated 3/19 and a minimum of 2X pwer week therapy recommended            Contractures Contractures Info: Not present    Additional Factors Info  Code Status, Allergies, Insulin Sliding Scale Code Status Info: Full Allergies Info: No known allergies   Insulin Sliding Scale Info: 0-9 Units, 3X daily with meals       Current Medications (09/16/2016):  This is the current hospital active medication list Current Facility-Administered Medications  Medication Dose Route Frequency Provider Last Rate Last Dose  . acetaminophen (TYLENOL) tablet 650 mg  650 mg Oral Q4H PRN Verlee Monte, MD      . aspirin chewable tablet 81 mg  81 mg  Oral Daily Verlee Monte, MD   81 mg at 09/16/16 1013  . cholecalciferol (VITAMIN D) tablet 2,000 Units  2,000 Units Oral Daily Verlee Monte, MD   2,000 Units at 09/16/16 1013  . ezetimibe (ZETIA) tablet 5 mg  5 mg Oral Daily Verlee Monte, MD   5 mg at 09/16/16 1013  . feeding supplement (ENSURE ENLIVE) (ENSURE ENLIVE) liquid 237 mL  237 mL Oral BID BM Mutaz Elmahi, MD    237 mL at 09/16/16 1014  . heparin injection 5,000 Units  5,000 Units Subcutaneous Q8H Verlee Monte, MD   5,000 Units at 09/16/16 0512  . HYDROcodone-acetaminophen (NORCO/VICODIN) 5-325 MG per tablet 1-2 tablet  1-2 tablet Oral Q4H PRN Verlee Monte, MD   2 tablet at 09/14/16 2255  . insulin aspart (novoLOG) injection 0-9 Units  0-9 Units Subcutaneous TID WC Verlee Monte, MD   3 Units at 09/16/16 1231  . insulin glargine (LANTUS) injection 25 Units  25 Units Subcutaneous QHS Verlee Monte, MD      . lisinopril (PRINIVIL,ZESTRIL) tablet 10 mg  10 mg Oral Daily Verlee Monte, MD   10 mg at 09/16/16 1013  . metoprolol succinate (TOPROL-XL) 24 hr tablet 25 mg  25 mg Oral Daily Verlee Monte, MD   25 mg at 09/16/16 1013  . multivitamin with minerals tablet 1 tablet  1 tablet Oral Daily Verlee Monte, MD   1 tablet at 09/16/16 1013  . ondansetron (ZOFRAN) tablet 4 mg  4 mg Oral Q6H PRN Verlee Monte, MD       Or  . ondansetron (ZOFRAN) injection 4 mg  4 mg Intravenous Q6H PRN Verlee Monte, MD      . pantoprazole (PROTONIX) EC tablet 40 mg  40 mg Oral BID Verlee Monte, MD   40 mg at 09/16/16 1013  . potassium chloride SA (K-DUR,KLOR-CON) CR tablet 40 mEq  40 mEq Oral Q6H Verlee Monte, MD   40 mEq at 09/16/16 0853  . simvastatin (ZOCOR) tablet 20 mg  20 mg Oral Daily Verlee Monte, MD   20 mg at 09/16/16 1013  . sodium chloride flush (NS) 0.9 % injection 3 mL  3 mL Intravenous Q12H Verlee Monte, MD   3 mL at 09/16/16 1014  . sucralfate (CARAFATE) 1 GM/10ML suspension 1 g  1 g Oral TID WC & HS Verlee Monte, MD   1 g at 09/16/16 1231     Discharge Medications: Please see discharge summary for a list of discharge medications.  Relevant Imaging Results:  Relevant Lab Results:   Additional Information ss#[id number]  Sable Feil, LCSW

## 2016-09-16 NOTE — Progress Notes (Signed)
Inpatient Diabetes Program Recommendations  AACE/ADA: New Consensus Statement on Inpatient Glycemic Control (2015)  Target Ranges:  Prepandial:   less than 140 mg/dL      Peak postprandial:   less than 180 mg/dL (1-2 hours)      Critically ill patients:  140 - 180 mg/dL   Lab Results  Component Value Date   GLUCAP 286 (H) 09/16/2016   HGBA1C 7.9 (H) 09/14/2016    Review of Glycemic Control  Diabetes history: DM2 Outpatient Diabetes medications: Lantus 20 units QHS, Novolog 4-16 tidwc units  Current orders for Inpatient glycemic control: Lantus 20 units QHS, Novolog 0-9 units tidwc  FBS and post-prandial blood sugars elevated. HgbA1C acceptable for age.  Inpatient Diabetes Program Recommendations:    Increase Lantus 24 units QHS Add Novolog 3 units tidwc for meal coverage insulin  Continue to follow. Thank you. Lorenda Peck, RD, LDN, CDE Inpatient Diabetes Coordinator [phone]

## 2016-09-16 NOTE — Progress Notes (Signed)
STROKE TEAM PROGRESS NOTE   SUBJECTIVE (INTERVAL HISTORY) Her No one is at the bedside.  She remains confused, saying she is in a "casada".she has some dementia at baseline handI do not know to what degree but I have personally reviewed an MRI FILMS AS WELL AS ADIOLOGIST Katie Lozano AND AM NOT CONVINCED THAT THE RIGHT  ABNORMALITY REPRESENTS A STROKE AND IS PROBABLY AN ARTIFACT   OBJECTIVE Temp:  [97.7 F (36.5 C)-98.4 F (36.9 C)] 98.4 F (36.9 C) (03/20 1000) Pulse Rate:  [59-97] 77 (03/20 1000) Cardiac Rhythm: Normal sinus rhythm (03/20 0700) Resp:  [16-20] 18 (03/20 1000) BP: (102-171)/(52-74) 144/52 (03/20 1000) SpO2:  [93 %-97 %] 93 % (03/20 1000)  CBC:   Recent Labs Lab 09/14/16 1029 09/15/16 0558 09/16/16 0511  WBC 5.9 8.5 7.3  NEUTROABS 2.8  --   --   HGB 12.9 13.9 12.5  HCT 38.4 41.4 37.3  MCV 93.2 91.0 91.6  PLT 215 230 149    Basic Metabolic Panel:   Recent Labs Lab 09/15/16 0558 09/16/16 0511  NA 140 137  K 3.4* 3.3*  CL 106 105  CO2 21* 21*  GLUCOSE 180* 301*  BUN 5* 9  CREATININE 0.58 0.70  CALCIUM 9.1 8.8*    PHYSICAL EXAM Pleasant elderly Caucasian lady not in distress. . Afebrile. Head is nontraumatic. Neck is supple without bruit.    Cardiac exam no murmur or gallop. Lungs are clear to auscultation. Distal pulses are well felt. Neurological Exam ;  Awake  Alert oriented x 1 only. Diminished attention, registration and recall. Follos only simple midline commands. Speech output is diminished and repeats few phrases repeatedly.. .eye movements full without nystagmus.fundi were not visualized. Vision acuity and fields appear normal. Hearing is normal. Palatal movements are normal. Face symmetric. Tongue midline. Normal strength, tone, reflexes and coordination. Normal sensation. Gait deferred.  ASSESSMENT/PLAN Katie Lozano is a 76 y.o. female with history of DM, HTN, TIA, recent hospitalization for respiratory failure, DKA and esophageal  necrosis presenting with progressive confusion x 2 weeks. She did not receive IV t-PA due to delay in arrival.   Confusion, no acute stroke. Doubt subacute stroke. No stroke/TIA etiology of confusion. Will rule out siezure  Resultant  confusion  MRI  No acute infarct. R parietal subacute cortical infarct (read by radiology, not seen by Katie Lozano). small vessel disease.  MRA   (not ordered).  Will do CTA head  Carotid Doppler  06/06/16 B ICA 1-39% stenosis, poor visualization of proximal L ICA  2D Echo  07/13/16 EF 55-60%, no SOE  Check EEG. Ordered. pending   LDL 41  HgbA1c 7.9   Heparin 5000 units sq tid for VTE prophylaxis Diet Carb Modified Fluid consistency: Thin; Room service appropriate? Yes  aspirin 81 mg daily ordered prior to admission, changed from plavix during hospitalization for necrotizing esophagitis. now on aspirin 81 mg daily  Therapy recommendations:  SNF  Disposition:  Plan return to SNF (recent SNF->ALF)  Essential Hypertension  Stable  Permissive hypertension (OK if < 220/120) but gradually normalize in 5-7 days  Long-term BP goal normotensive  Hyperlipidemia  Home meds:  zetia 10 and zocor 20, both resumed in hospital  LDL 41 goal  Continue statin at discharge  Diabetes type II  HgbA1c above goal < 7.0  Diabetes RN following - recommends increae lantus to 24u q hs and add 3u novolog for meal coverage  Uncontrolled  Other Stroke Risk Factors  Advanced age  UDS negative  Hx stroke/TIA  05/2016 - R brain TIA, suspicious for embolic process  Family hx stroke (mother)  Other Active Problems  Dementia without behavioral disturbance  Recent hx of GIB and esophageal necrosis  Hypokalemia  Hospital day # 2  Radene Journey [facility] for Pager information 09/16/2016 2:52 PM  I have personally examined this patient, reviewed notes, independently viewed imaging studies, participated in medical decision making and  plan of care.ROS completed by me personally and pertinent positives fully documented  I have made any additions or clarifications directly to the above note. Agree with note above. She has presented with subacute confusion of unclear etiology. MRI brain shows subtle right pareital cortical thickening on solitary sequence in axial films only and it is likely artefcat and unlikely to be a stroke or explanation of patient`s clinical presentation. Check EEG for seizures. D/Y Katie Shon [facility] neurohospitalist team and requested them to resume neurological f/u from tomorrow. Greater than 50% time during this 35 minutye visit was spent on coordination of care and d/w with neuroradiologist and neurohospitalist. Stroke team will sign off.  Katie Contras, MD Medical Director [place] Pager: [phone] 09/16/2016 10:34 PM  To contact Stroke Continuity provider, please refer to [email or web address] After hours, contact General Neurology

## 2016-09-16 NOTE — Procedures (Signed)
Electroencephalogram (EEG) Report  Date of study: 09/16/16  Requesting clinician: Burnetta Sabin, NP  Reason for study: Evaluate for seizure  Brief clinical history: This is a 76 year old woman admitted for acute altered mental status. MRI showed possible stroke. EEG is now being requested for further evaluation.  Medications:  Current Facility-Administered Medications:  .  acetaminophen (TYLENOL) tablet 650 mg, 650 mg, Oral, Q4H PRN, Verlee Monte, MD .  aspirin chewable tablet 81 mg, 81 mg, Oral, Daily, Verlee Monte, MD, 81 mg at 09/16/16 1013 .  cholecalciferol (VITAMIN D) tablet 2,000 Units, 2,000 Units, Oral, Daily, Verlee Monte, MD, 2,000 Units at 09/16/16 1013 .  ezetimibe (ZETIA) tablet 5 mg, 5 mg, Oral, Daily, Verlee Monte, MD, 5 mg at 09/16/16 1013 .  feeding supplement (ENSURE ENLIVE) (ENSURE ENLIVE) liquid 237 mL, 237 mL, Oral, BID BM, Verlee Monte, MD, 237 mL at 09/16/16 1432 .  heparin injection 5,000 Units, 5,000 Units, Subcutaneous, Q8H, Verlee Monte, MD, 5,000 Units at 09/16/16 1432 .  HYDROcodone-acetaminophen (NORCO/VICODIN) 5-325 MG per tablet 1-2 tablet, 1-2 tablet, Oral, Q4H PRN, Verlee Monte, MD, 2 tablet at 09/14/16 2255 .  insulin aspart (novoLOG) injection 0-9 Units, 0-9 Units, Subcutaneous, TID WC, Verlee Monte, MD, 3 Units at 09/16/16 1231 .  insulin glargine (LANTUS) injection 25 Units, 25 Units, Subcutaneous, QHS, Mutaz Elmahi, MD .  lisinopril (PRINIVIL,ZESTRIL) tablet 10 mg, 10 mg, Oral, Daily, Verlee Monte, MD, 10 mg at 09/16/16 1013 .  metoprolol succinate (TOPROL-XL) 24 hr tablet 25 mg, 25 mg, Oral, Daily, Verlee Monte, MD, 25 mg at 09/16/16 1013 .  multivitamin with minerals tablet 1 tablet, 1 tablet, Oral, Daily, Verlee Monte, MD, 1 tablet at 09/16/16 1013 .  ondansetron (ZOFRAN) tablet 4 mg, 4 mg, Oral, Q6H PRN **OR** ondansetron (ZOFRAN) injection 4 mg, 4 mg, Intravenous, Q6H PRN, Verlee Monte, MD .  pantoprazole (PROTONIX) EC tablet 40 mg, 40 mg, Oral,  BID, Verlee Monte, MD, 40 mg at 09/16/16 1013 .  simvastatin (ZOCOR) tablet 20 mg, 20 mg, Oral, Daily, Verlee Monte, MD, 20 mg at 09/16/16 1013 .  sodium chloride flush (NS) 0.9 % injection 3 mL, 3 mL, Intravenous, Q12H, Verlee Monte, MD, 3 mL at 09/16/16 1014 .  sucralfate (CARAFATE) 1 GM/10ML suspension 1 g, 1 g, Oral, TID WC & HS, Verlee Monte, MD, 1 g at 09/16/16 1800  Description: This is a routine EEG performed using standard international 10-20 electrode placement. A total of 18 channels are recorded, including one for the EKG. Wakefulness, drowsiness, and sleep are recorded.   Activating Maneuvers: None  Findings:  The EKG channel demonstrates a regular rhythm with a rate of 70-80 beats per minute.   The background consists largely of alpha activity. The best dominant posterior rhythm is 8-9 hertz. This is symmetric and reacts as expected with eye opening. There is a mild degree of intermixed slowing, consisting mostly of theta activity with minimal delta.  There are no focal asymmetries. No epileptiform discharges are present. No seizures are recorded.   Drowsiness is recorded and is normal in appearance. Stage NI sleep is recorded. This appears normal. Transitions between wakefulness and sleep are normal.  Impression:  This is a mildly abnormal EEG due to the presence of mild diffuse generalized slowing. No epileptiform abnormal seizures.  Clinical correlation: This EEG is suggestive of a mild global encephalopathic process, nonspecific as to etiology. This pattern can be seen with metabolic derangements, medication effect, and neurodegenerative disorders. Critical correlation is required.   Christia Reading  Shon Hale, MD [facility]

## 2016-09-16 NOTE — Consult Note (Signed)
   [facility] Inpatient Consult   09/16/2016  Katie Lozano 1940-07-23 [id number]  Patient was reviewed for 3 admission in the past 6 months and a beneficiary of [employer] plan.  Chart review the patient is 76 yo female with new admit for acute encephalopathy with bronchitis, PMHx:  GI bleed, DKA, AKI, NSTEMI with shock, erosive gastritis, esophageal necrosis, dementia, DM, HTN, and r/o stroke. Also, in review of the PT and [place] notes for recommendation for a skilled level of care.  Patient remains confused, pleasantly.  No community care management needs noted at this time, as she is from [facility] facility prior to admission. For questions, please contact:  Natividad Brood, RN BSN [facility] Liaison  [phone] business mobile phone Toll free office [phone]

## 2016-09-16 NOTE — Progress Notes (Signed)
Transport here to get pt for CT.  Peri-care performed, bed pads changed.

## 2016-09-17 DIAGNOSIS — F0150 Vascular dementia without behavioral disturbance: Secondary | ICD-10-CM

## 2016-09-17 DIAGNOSIS — G9340 Encephalopathy, unspecified: Secondary | ICD-10-CM

## 2016-09-17 DIAGNOSIS — R4182 Altered mental status, unspecified: Secondary | ICD-10-CM

## 2016-09-17 DIAGNOSIS — R410 Disorientation, unspecified: Secondary | ICD-10-CM

## 2016-09-17 LAB — CBC
HEMATOCRIT: 39.2 % (ref 36.0–46.0)
HEMOGLOBIN: 13.3 g/dL (ref 12.0–15.0)
MCH: 31.1 pg (ref 26.0–34.0)
MCHC: 33.9 g/dL (ref 30.0–36.0)
MCV: 91.8 fL (ref 78.0–100.0)
Platelets: 198 10*3/uL (ref 150–400)
RBC: 4.27 MIL/uL (ref 3.87–5.11)
RDW: 13.9 % (ref 11.5–15.5)
WBC: 7.8 10*3/uL (ref 4.0–10.5)

## 2016-09-17 LAB — GLUCOSE, CAPILLARY
GLUCOSE-CAPILLARY: 228 mg/dL — AB (ref 65–99)
GLUCOSE-CAPILLARY: 196 mg/dL — AB (ref 65–99)
Glucose-Capillary: 380 mg/dL — ABNORMAL HIGH (ref 65–99)
Glucose-Capillary: 343 mg/dL — ABNORMAL HIGH (ref 65–99)

## 2016-09-17 LAB — COMPREHENSIVE METABOLIC PANEL
ALK PHOS: 65 U/L (ref 38–126)
ALT: 16 U/L (ref 14–54)
ANION GAP: 12 (ref 5–15)
AST: 24 U/L (ref 15–41)
Albumin: 3.4 g/dL — ABNORMAL LOW (ref 3.5–5.0)
BUN: 10 mg/dL (ref 6–20)
CALCIUM: 9.3 mg/dL (ref 8.9–10.3)
CO2: 21 mmol/L — AB (ref 22–32)
Chloride: 104 mmol/L (ref 101–111)
Creatinine, Ser: 0.76 mg/dL (ref 0.44–1.00)
GFR calc non Af Amer: >60 mL/min (ref >60)
Glucose, Bld: 405 mg/dL — ABNORMAL HIGH (ref 65–99)
POTASSIUM: 4.1 mmol/L (ref 3.5–5.1)
SODIUM: 137 mmol/L (ref 135–145)
TOTAL PROTEIN: 6.8 g/dL (ref 6.5–8.1)
Total Bilirubin: 0.9 mg/dL (ref 0.3–1.2)

## 2016-09-17 LAB — BASIC METABOLIC PANEL
ANION GAP: 10 (ref 5–15)
BUN: 9 mg/dL (ref 6–20)
CALCIUM: 9 mg/dL (ref 8.9–10.3)
CHLORIDE: 106 mmol/L (ref 101–111)
CO2: 20 mmol/L — AB (ref 22–32)
Creatinine, Ser: 0.72 mg/dL (ref 0.44–1.00)
GFR calc non Af Amer: >60 mL/min (ref >60)
Glucose, Bld: 374 mg/dL — ABNORMAL HIGH (ref 65–99)
POTASSIUM: 3.8 mmol/L (ref 3.5–5.1)
Sodium: 136 mmol/L (ref 135–145)

## 2016-09-17 MED ORDER — CLOPIDOGREL BISULFATE 75 MG PO TABS
75.0000 mg | ORAL_TABLET | Freq: Every day | ORAL | Status: DC
  Administered 2016-09-17 – 2016-09-18 (×2): 75 mg via ORAL
  Filled 2016-09-17 (×2): qty 1

## 2016-09-17 MED ORDER — INSULIN GLARGINE 100 UNIT/ML ~~LOC~~ SOLN
10.0000 [IU] | Freq: Once | SUBCUTANEOUS | Status: AC
  Administered 2016-09-17: 10 [IU] via SUBCUTANEOUS
  Filled 2016-09-17: qty 0.1

## 2016-09-17 MED ORDER — INSULIN GLARGINE 100 UNIT/ML ~~LOC~~ SOLN
5.0000 [IU] | Freq: Once | SUBCUTANEOUS | Status: AC
Start: 2016-09-17 — End: 2016-09-17
  Administered 2016-09-17: 5 [IU] via SUBCUTANEOUS
  Filled 2016-09-17: qty 0.05

## 2016-09-17 MED ORDER — INSULIN GLARGINE 100 UNIT/ML ~~LOC~~ SOLN
35.0000 [IU] | Freq: Every day | SUBCUTANEOUS | Status: DC
  Administered 2016-09-17: 35 [IU] via SUBCUTANEOUS
  Filled 2016-09-17: qty 0.35

## 2016-09-17 NOTE — Progress Notes (Signed)
Physical Therapy Treatment Patient Details Name: Katie Lozano MRN: [id number] DOB: 1940-08-23 Today's Date: 09/17/2016    History of Present Illness 76 yo female with new admit for acute encephalopathy with bronchitis, PMHx:  GI bleed, DKA, AKI, NSTEMI with shock, erosive gastritis, esophageal necrosis, dementia, DM, HTN,     PT Comments    Pt has improved since evaluation concerning mentation.  She initiates movement to cues and follows through with appropriate sequencing with minimal cues.  Generally at a minimal assist level for basic mobility and gait.    Follow Up Recommendations  SNF     Equipment Recommendations  None recommended by PT    Recommendations for Other Services       Precautions / Restrictions Precautions Precautions: Fall    Mobility  Bed Mobility Overal bed mobility: Needs Assistance Bed Mobility: Supine to Sit;Sit to Supine     Supine to sit: Min assist Sit to supine: Min assist   General bed mobility comments: Stability assist only  Transfers Overall transfer level: Needs assistance Equipment used: Rolling walker (2 wheeled) Transfers: Sit to/from Stand Sit to Stand: Min assist         General transfer comment: Pt initiating movement to cues and minimal assist  Ambulation/Gait Ambulation/Gait assistance: Min assist Ambulation Distance (Feet): 300 Feet Assistive device: Rolling walker (2 wheeled) Gait Pattern/deviations: Step-through pattern Gait velocity: reduced Gait velocity interpretation: Below normal speed for age/gender General Gait Details: generally steady, but with occasional drifting sharply off toward and into the L wall   Stairs            Wheelchair Mobility    Modified Rankin (Stroke Patients Only)       Balance Overall balance assessment: Needs assistance Sitting-balance support: No upper extremity supported Sitting balance-Leahy Scale: Fair       Standing balance-Leahy Scale: Fair                      Cognition Arousal/Alertness: Awake/alert Behavior During Therapy: Flat affect Overall Cognitive Status: No family/caregiver present to determine baseline cognitive functioning Area of Impairment: Orientation;Safety/judgement;Following commands;Problem solving;Attention Orientation Level: Place;Time;Situation Current Attention Level: Focused   Following Commands: Follows one step commands with increased time Safety/Judgement: Decreased awareness of deficits;Decreased awareness of safety   Problem Solving: Slow processing;Requires verbal cues;Requires tactile cues;Decreased initiation;Difficulty sequencing      Exercises      General Comments        Pertinent Vitals/Pain Pain Assessment: Faces Faces Pain Scale: No hurt    Home Living                      Prior Function            PT Goals (current goals can now be found in the care plan section) Acute Rehab PT Goals Patient Stated Goal: Not stated PT Goal Formulation: With family Time For Goal Achievement: 09/29/16 Potential to Achieve Goals: Good Progress towards PT goals: Progressing toward goals    Frequency    Min 3X/week      PT Plan Current plan remains appropriate    Co-evaluation             End of Session   Activity Tolerance: Patient tolerated treatment well Patient left: in bed;with bed alarm set;with call bell/phone within reach Nurse Communication: Mobility status PT Visit Diagnosis: Unsteadiness on feet (R26.81)     Time: 4268-3419 PT Time Calculation (min) (ACUTE ONLY): 14 min  Charges:  $Gait Training: 8-22 mins                    G Codes:       Tessie Fass Sahas Sluka 09/17/2016, 4:21 PM 09/17/2016  Donnella Sham, [place] [phone]  (pager)

## 2016-09-17 NOTE — Progress Notes (Signed)
PROGRESS NOTE    Katie Lozano  HQI:[id number] DOB: [birth year] DOA: 09/14/2016 PCP: Phineas Inches, MD   Brief Narrative:  Katie Lozano is a 76 y.o. female with medical history significant of IDDM, HTN, history of TIA, recent hospitalization of January 2018 secondary to respiratory failure, DKA and esophageal necrosis. Patient was in a nursing home, then discharge to ALF. All patient history obtained from sister at bedside and EDP notes, patient is confused, disoriented and could not provide any meaningful history. Per sister at bedside she started to have confusion about 2 weeks ago, likely not taking her medication, one day she was found sleeping on the floor, this is got progressively worse to the point she couldn't carry any conversation so she brought her to [facility] for further evaluation, her labs looks okay referred for hospital evaluation for encephalopathy/CVA workup. Await recommendation from stroke, likely can be discharged in a.m. To SNF.  Subjective: Still confused and disoriented, oriented only to self, speech is clear but nonsensical.  Assessment & Plan:   Principal Problem:   Acute encephalopathy Active Problems:   Type 2 diabetes mellitus with diabetic nephropathy, with long-term current use of insulin (HCC)   Essential hypertension   Esophageal necrosis   Dementia without behavioral disturbance   Protein-calorie malnutrition, severe   Cerebral embolism with cerebral infarction   Altered mental status   Acute Encephalopathy Extensive workup including the following Ammonia 21 Folate 29.9 Vitamin B12 569 Hemoglobin A1c 7.9 TSH 2.036 Urinalysis notable for glucose greater than 500, ketones 15, hemoglobin trace Urine drug screen negative Unclear Etiology.  Possibly explained by MRI findings- no acute infarct, but new focus of gyral signal abnormality in the right parietal lobe which may reflect a small subacute cortical infarct.  Stroke team  disagrees Eventually will need SNF on discharge.   Subacute CVA MRI showed subacute infarct, neurologically evaluate. Has history of multiple TIAs, she was on Plavix which was recently discontinued because of recent esophageal necrosis. CVA workup, I will not repeat carotid Doppler done on 12/17 and echo done on 1/18. Await stroke team recommendation. PT/OT recommended SNF. CSW consulted.  Type 2 diabetes, uncontrolled with hyperglycemia.  Accu-Cheks remain uncontrolled, no evidence of DKA. A1C 7.9 (09/14/16). Increased Lantus to 35 units and an extra 15 units this morning  Hypokalemia. Potassium 4.1 today.    Recent history of GI bleed and esophageal necrosis. Appears to be stable, no complaints of any epigastric abdominal pain, hemoglobin of 12.9. Continue PPIs and Carafate, recommend follow-up EGD as outpatient.Neurology recommends to change ASA to plavix .Plavix monotherapy if safe from a GI perspective  Essential Hypertension. Stable, but systolic is still high. Continue lisinopril and metoprolol.   Dementia without behavioral disturbances. On admission, sister at bedside reported she had memory problems before this acute worsening of cognitive function. She has risk factor for vascular dementia, diabetes, hypertension and history of recurrent TIAs. LDL 12/67/17  is 48. MRI shows mild/moderate small vessel ischemia disease.   History of TIAs.  Continues to have nonsensical speech. MRI as above. Patient followed by Dr. Erlinda Hong  , last visit  on 09/02/16.    DVT prophylaxis: SQ Heparin Code Status: Full Family Communication: No family at bedside. Difficult to obtain any history from patient due to confusion.  Disposition Plan: Uncontrolled CBG, not stable for discharge today   Consultants:   Neurology  Procedures:   None  Antimicrobials:   None   Objective: Vitals:   09/16/16 1000 09/16/16  2223 09/17/16 0634 09/17/16 0900  BP: (!) 144/52 124/78 131/65 (!) 154/49  Pulse: 77  77 74   Resp: 18 19 20 18   Temp: 98.4 F (36.9 C) 98.4 F (36.9 C) 98.4 F (36.9 C) 98.2 F (36.8 C)  TempSrc: Oral Oral Oral Oral  SpO2: 93% 98% 99%   Weight:  61.4 kg (135 lb 5.8 oz)    Height:        Intake/Output Summary (Last 24 hours) at 09/17/16 1109 Last data filed at 09/17/16 1025  Gross per 24 hour  Intake                0 ml  Output                0 ml  Net                0 ml   Filed Weights   09/14/16 1003 09/15/16 0416 09/16/16 2223  Weight: 65.3 kg (144 lb) 61.7 kg (136 lb) 61.4 kg (135 lb 5.8 oz)    Examination:  General exam: Appears calm and comfortable  Respiratory system: Clear to auscultation. Respiratory effort normal. Cardiovascular system: S1 & S2 heard, RRR. No JVD, murmurs, rubs, gallops or clicks. 1+ pedal edema. Gastrointestinal system: Abdomen is nondistended, soft and nontender. No organomegaly or masses felt. Central nervous system: Alert and oriented. No focal neurological deficits. Extremities: Symmetric 5 x 5 power. Skin: No rashes, lesions or ulcers Psychiatry: Patient is very confused. Scored 10 on MMS exam.    Data Reviewed: I have personally reviewed following labs and imaging studies  CBC:  Recent Labs Lab 09/14/16 1029 09/15/16 0558 09/16/16 0511 09/17/16 0701  WBC 5.9 8.5 7.3 7.8  NEUTROABS 2.8  --   --   --   HGB 12.9 13.9 12.5 13.3  HCT 38.4 41.4 37.3 39.2  MCV 93.2 91.0 91.6 91.8  PLT 215 230 188 852   Basic Metabolic Panel:  Recent Labs Lab 09/14/16 1029 09/15/16 0558 09/16/16 0511 09/17/16 0701 09/17/16 0838  NA 136 140 137 136 137  K 3.8 3.4* 3.3* 3.8 4.1  CL 104 106 105 106 104  CO2 23 21* 21* 20* 21*  GLUCOSE 414* 180* 301* 374* 405*  BUN 10 5* 9 9 10   CREATININE 0.76 0.58 0.70 0.72 0.76  CALCIUM 9.1 9.1 8.8* 9.0 9.3   GFR: Estimated Creatinine Clearance: 47.7 mL/min (by C-G formula based on SCr of 0.76 mg/dL). Liver Function Tests:  Recent Labs Lab 09/14/16 1029 09/17/16 0838  AST 28 24    ALT 19 16  ALKPHOS 66 65  BILITOT 0.9 0.9  PROT 7.0 6.8  ALBUMIN 3.6 3.4*   No results for input(s): LIPASE, AMYLASE in the last 168 hours.  Recent Labs Lab 09/14/16 1851  AMMONIA 21   Coagulation Profile:  Recent Labs Lab 09/14/16 1851  INR 1.06   Cardiac Enzymes: No results for input(s): CKTOTAL, CKMB, CKMBINDEX, TROPONINI in the last 168 hours. BNP (last 3 results) No results for input(s): PROBNP in the last 8760 hours. HbA1C:  Recent Labs  09/14/16 1851  HGBA1C 7.9*   CBG:  Recent Labs Lab 09/16/16 0745 09/16/16 1216 09/16/16 1713 09/16/16 2154 09/17/16 0836  GLUCAP 286* 243* 206* 349* 380*   Lipid Profile:  Recent Labs  09/16/16 0511  CHOL 107  HDL 26*  LDLCALC 41  TRIG 200*  CHOLHDL 4.1   Thyroid Function Tests:  Recent Labs  09/14/16 1851  TSH 2.036  Anemia Panel:  Recent Labs  09/14/16 1851  VITAMINB12 569  FOLATE 29.9   Urine analysis:    Component Value Date/Time   COLORURINE YELLOW 09/14/2016 1150   APPEARANCEUR CLEAR 09/14/2016 1150   LABSPEC 1.027 09/14/2016 1150   PHURINE 6.0 09/14/2016 1150   GLUCOSEU >=500 (A) 09/14/2016 1150   HGBUR TRACE (A) 09/14/2016 1150   BILIRUBINUR NEGATIVE 09/14/2016 1150   KETONESUR 15 (A) 09/14/2016 1150   PROTEINUR NEGATIVE 09/14/2016 1150   NITRITE NEGATIVE 09/14/2016 1150   LEUKOCYTESUR NEGATIVE 09/14/2016 1150   Sepsis Labs: @LABRCNTIP (procalcitonin:4,lacticidven:4)  ) Recent Results (from the past 240 hour(s))  MRSA PCR Screening     Status: None   Collection Time: 09/14/16  4:10 PM  Result Value Ref Range Status   MRSA by PCR NEGATIVE NEGATIVE Final    Comment:        The GeneXpert MRSA Assay (FDA approved for NASAL specimens only), is one component of a comprehensive MRSA colonization surveillance program. It is not intended to diagnose MRSA infection nor to guide or monitor treatment for MRSA infections.          Radiology Studies: Ct Angio Head W Or Wo  Contrast  Result Date: 09/16/2016 CLINICAL DATA:  Confusion, altered mental status. EXAM: CT ANGIOGRAPHY HEAD TECHNIQUE: Multidetector CT imaging of the head was performed using the standard protocol during bolus administration of intravenous contrast. Multiplanar CT image reconstructions and MIPs were obtained to evaluate the vascular anatomy. CONTRAST:  50 mL Isovue 370 IV COMPARISON:  Brain MRI 09/14/2016 Head CT 09/14/2016 FINDINGS: CT HEAD Brain: No mass lesion, intraparenchymal hemorrhage or extra-axial collection. No evidence of acute cortical infarct. There is periventricular hypoattenuation compatible with chronic microvascular disease. Vascular: No hyperdense vessel or unexpected calcification. Skull: Normal visualized skull base, calvarium and extracranial soft tissues. Sinuses/Orbits: No sinus fluid levels or advanced mucosal thickening. No mastoid effusion. Normal orbits. CTA HEAD Anterior circulation: --Intracranial internal carotid arteries: There is atherosclerotic calcification within the cavernous segments of both internal carotid arteries without hemodynamically significant stenosis. --Anterior cerebral arteries: Normal. --Middle cerebral arteries: Normal. --Posterior communicating arteries: Present on the right. Posterior circulation: --Posterior cerebral arteries: Normal. --Superior cerebellar arteries: Normal. --Basilar artery: Normal. --Anterior inferior cerebellar arteries: Normal on the right. Not clearly seen on the left. --Posterior inferior cerebellar arteries: Normal. Venous sinuses: As permitted by contrast timing, patent. Anatomic variants: None. Delayed phase: No abnormal intracranial enhancement. IMPRESSION: 1. Normal CTA of the brain. 2. Chronic microvascular ischemia without acute intracranial abnormality. Electronically Signed   By: Ulyses Jarred M.D.   On: 09/16/2016 22:43        Scheduled Meds: . aspirin  81 mg Oral Daily  . cholecalciferol  2,000 Units Oral Daily  .  ezetimibe  5 mg Oral Daily  . feeding supplement (ENSURE ENLIVE)  237 mL Oral BID BM  . heparin  5,000 Units Subcutaneous Q8H  . insulin aspart  0-9 Units Subcutaneous TID WC  . insulin glargine  35 Units Subcutaneous QHS  . insulin glargine  5 Units Subcutaneous Once  . lisinopril  10 mg Oral Daily  . metoprolol succinate  25 mg Oral Daily  . multivitamin with minerals  1 tablet Oral Daily  . pantoprazole  40 mg Oral BID  . simvastatin  20 mg Oral Daily  . sodium chloride flush  3 mL Intravenous Q12H  . sucralfate  1 g Oral TID WC & HS   Continuous Infusions:    LOS: 3 days  Time spent: 25 minutes   If 7PM-7AM, please contact night-coverage [email or web address] Password [email or web address] 09/17/2016, 11:09 AM     09/17/2016, 11:09 AM

## 2016-09-17 NOTE — Evaluation (Signed)
Speech Language Pathology Evaluation Patient Details Name: MONICKA CYRAN MRN: [id number] DOB: Oct 20, 1940 Today's Date: 09/17/2016 Time: 9833-8250 SLP Time Calculation (min) (ACUTE ONLY): 23 min  Problem List:  Patient Active Problem List   Diagnosis Date Noted  . Altered mental status   . Cerebral embolism with cerebral infarction 09/16/2016  . Protein-calorie malnutrition, severe 09/15/2016  . Acute encephalopathy 09/14/2016  . Dementia without behavioral disturbance 09/14/2016  . 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 Encephalopathy 09/02/2016  . Diabetes mellitus ([place]) 09/02/2016  . Anemia 09/02/2016  . 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 NSTEMI (non-ST elevated myocardial infarction) ([place]) 07/22/2016  . Pressure injury of skin 07/17/2016  . Encounter for central line placement   . Metabolic acidosis   . AKI (acute kidney injury) ([place])   . UGIB (upper gastrointestinal bleed)   . Melena   . Acute blood loss anemia   . Diabetic ketoacidosis without coma associated with type 2 diabetes mellitus ([place]) 07/11/2016  . Hypovolemic shock ([place])   . Essential hypertension 06/06/2016  . Hypokalemia 06/06/2016  . CKD (chronic kidney disease), stage III 06/06/2016  . Carotid artery syndrome 06/05/2016  . Type 2 diabetes mellitus with diabetic nephropathy, with long-term current use of insulin ([place]) 03/30/2015  . History of colonic polyps 03/30/2015  . Hyperlipidemia 03/30/2015   Past Medical History:  Past Medical History:  Diagnosis Date  . Allergy   . Anemia associated with acute blood loss 06/2016  . Colon polyp   . Diabetes mellitus without complication ([place])   . Diverticulitis   . DKA (diabetic ketoacidoses) ([place]) 07/11/2016  . GERD (gastroesophageal reflux disease)   . Hematemesis 07/11/2016  . Hyperlipidemia   . Melena 06/2016  . Stroke ([place])   . UGIB (upper gastrointestinal bleed) 06/2016   Past Surgical History:  Past Surgical History:  Procedure  Laterality Date  . APPENDECTOMY  1956  . CHOLECYSTECTOMY  1997  . ESOPHAGOGASTRODUODENOSCOPY N/A 07/14/2016   Procedure: ESOPHAGOGASTRODUODENOSCOPY (EGD);  Surgeon: Manus Gunning, MD;  Location: [facility];  Service: Gastroenterology;  Laterality: N/A;  at bedside  . TONSILLECTOMY     age 85   HPI:  Pt is a 76 y.o.femalewith PMH significant of IDDM, HTN, history of TIA, recent hospitalization of January 2018 secondary to respiratory failure, DKA and esophageal necrosis. Per sister at bedside she started to have confusion about 2 weeks ago, likely not taking her medication, oneday she was found sleeping on the floor, this is got progressively worse to the point she couldn't carry any conversation. MRI of brain showed no acute infarct. New focus of gyral signal abnormality in the right parietal lobe which may reflect a small subacute cortical infarct. Mild to moderate chronic small vessel ischemic disease. CXR showed diffuse peribronchial cuffing, concerning for an acute bronchitis. Aortic atherosclerosis.   Assessment / Plan / Recommendation Clinical Impression  Ms. Sheppard was alert and oriented to self, however was confused and was disoriented to place, time, and situation. Family was not present to provide insight into pt's cognitive status and functioning at baseline. Per chart, pt has a history of dementia and currently lives in memory care unit of an assisted living facility. Pt had to be reminded throughout evaluation that she was at [facility] SLP administered subtests of the Cognistat to assess Ms. Leech's language and congitive abilities. Testing revaled  impairments in the following domains: orientation, comprehension (following basic multistep commands), memory, and judgment. Speech intelligibility was clear, however content/semantics was nonsensical. Educated pt  re: performance on assessment and provided safety precautions (instructed pt to use call bell if needed for  assistance). Further speech treatment not necessary as pt has history of dementia and wil have the necessary supervision/care needed in the memory care unit. ST signing off.    SLP Assessment  SLP Recommendation/Assessment: Patient does not need any further Speech Lanaguage Pathology Services SLP Visit Diagnosis: Cognitive communication deficit (R41.841)    Follow Up Recommendations  Skilled Nursing facility;24 hour supervision/assistance    Frequency and Duration           SLP Evaluation Cognition  Overall Cognitive Status: No family/caregiver present to determine baseline cognitive functioning (chart states hx of dementia) Arousal/Alertness: Awake/alert Orientation Level: Oriented to person;Disoriented to place;Disoriented to time;Disoriented to situation Attention: Sustained Sustained Attention: Impaired Sustained Attention Impairment: Verbal basic;Functional basic Memory: Impaired Memory Impairment: Storage deficit;Decreased short term memory Decreased Short Term Memory: Verbal basic;Functional basic (had to be reminded that she was at [facility]) Awareness: Impaired Awareness Impairment: Intellectual impairment;Emergent impairment;Anticipatory impairment Problem Solving: Impaired Problem Solving Impairment: Verbal basic;Functional basic Safety/Judgment: Impaired       Comprehension  Auditory Comprehension Overall Auditory Comprehension: Impaired Yes/No Questions: Not tested Commands: Impaired Multistep Basic Commands: 0-24% accurate (unable to perform multi-step commands) Conversation: Complex Interfering Components: Working Curator: Not tested Reading Comprehension Reading Status: Not tested    Expression Expression Primary Mode of Expression: Verbal Verbal Expression Overall Verbal Expression: Impaired Initiation: No impairment Level of Generative/Spontaneous Verbalization: Conversation Repetition:  (not  tested) Naming: No impairment Pragmatics: Impairment Impairments: Topic appropriateness;Topic maintenance Interfering Components: Premorbid deficit Other Verbal Expression Comments:  (decreased semantics, tangential) Written Expression Dominant Hand: Right Written Expression: Not tested   Oral / Motor  Oral Motor/Sensory Function Overall Oral Motor/Sensory Function:  (no focal weakness) Motor Speech Overall Motor Speech: Appears within functional limits for tasks assessed Respiration: Within functional limits Phonation: Normal Resonance: Within functional limits Articulation: Within functional limitis Intelligibility: Intelligible Motor Planning: Witnin functional limits Motor Speech Errors: Not applicable   GO                    [employer] , Student-SLP 09/17/2016, 11:59 AM

## 2016-09-17 NOTE — Clinical Social Work Note (Signed)
Clinical Social Work Assessment  Patient Details  Name: Katie Lozano MRN: [id number] Date of Birth: 27-Jan-1941  Date of referral:  09/14/16               Reason for consult:  Facility Placement                Permission sought to share information with:  Family Supports Permission granted to share information::  No (Patient disoriented X4)  Name::     Katie Lozano  Agency::     Relationship::  Sister and POA  Contact Information:  [phone]  Housing/Transportation Living arrangements for the past 2 months:  [place] ([place]) Source of Information:  Other (Comment Required) (Sister Katie) Patient Interpreter Needed:  None Criminal Activity/Legal Involvement Pertinent to Current Situation/Hospitalization:  No - Comment as needed Significant Relationships:  Siblings, Adult Children Lives with:  Facility Resident (Came to hospital from ALF) Do you feel safe going back to the place where you live?  No Need for family participation in patient care:  Yes (Comment)  Care giving concerns: Sister and other siblings agree that patient needs ST rehab and probably a long-term care stay in a skilled facility.  Social Worker assessment / plan:  CSW talked with [name], sister and POA for patient regarding discharge planning. Per sister, she and her brothers have been looking after patient. She has 2 daughters, but per sister, one daughter-Katie Lozano lives in [place] but has no contact with patient, and the other daughter Katie Lozano (has her own health issues per sister) lives about 3 hours away and is kept updated with how patient is doing.   CSW advised that patient has an apartment at [facility] in [place] and came to hospital and was discharged to [facility]. After her stay there she went to [place] where she could be looked after, her sugar levels and insulin administration monitored and be given her other medications. Sister  reported that while at the ALF, patient memory got worst and she had trouble with word-finding when talking. [name] was happy after visiting with patient on 3/20 as she could feed herself and was laughing, although she still had problems with word-finding at times. Sister is requesting [facility] skilled nursing facility for short-term rehab, as this is near to where she lives. [name] also indicated that she has to make a decision on what to do with patient's apartment.   Employment status:  Retired Forensic scientist:  [employer]) PT Recommendations:  [place] / Referral to community resources:  [place] (Sister provided CSW with facility preference)  Patient/Family's Response to care: No concerns expressed regarding patient's care during hospitalization.  Patient/Family's Understanding of and Emotional Response to Diagnosis, Current Treatment, and Prognosis:  Sister understands that patient can no longer live independently and has applied for Medicaid for her.  Emotional Assessment Appearance:  Other (Comment Required (Did not visit with patient.) Attitude/Demeanor/Rapport:  Unable to Assess Affect (typically observed):  Unable to Assess Orientation:   (Disoriented X4) Alcohol / Substance use:  Tobacco Use, Alcohol Use, Illicit Drugs (Patient reported that she quit smoking and does not drink or use illicit drugs) Psych involvement (Current and /or in the community):  No (Comment)  Discharge Needs  Concerns to be addressed:  Discharge Planning Concerns Readmission within the last 30 days:  No Current discharge risk:  None Barriers to Discharge:  No Barriers Identified   Katie Lozano  Katie Lozano, [place] 09/17/2016, 5:29 PM

## 2016-09-17 NOTE — Clinical Social Work Note (Signed)
CSW advised in morning progression that patient was medically stable for discharge, however per MD, due to uncontrolled CBG, patient not stable for discharge today (3/21). Admissions director Robin with [facility] informed. She was also provided with insurance authorization - [id number], which is effective 3/21.  CSW will continue to monitor patient's progress, advise insurance contact that patient did not discharge on 3/21 and facilitate discharge to skilled facility when medically stable.  Katie Lozano, MSW, LCSW Licensed Clinical Social Worker [place] [phone]

## 2016-09-17 NOTE — Clinical Social Work Placement (Signed)
   CLINICAL SOCIAL WORK PLACEMENT  NOTE  Date:  09/17/2016  Patient Details  Name: Katie Lozano MRN: [id number] Date of Birth: 02/19/1941  Clinical Social Work is seeking post-discharge placement for this patient at the [place] level of care (*CSW will initial, date and re-position this form in  chart as items are completed):  No (Sister had facility preference)   Patient/family provided with [place] Work Department's list of facilities offering this level of care within the geographic area requested by the patient (or if unable, by the patient's family).  Yes   Patient/family informed of their freedom to choose among providers that offer the needed level of care, that participate in Medicare, Medicaid or managed care program needed by the patient, have an available bed and are willing to accept the patient.  No   Patient/family informed of [facility]'s ownership interest in [facility] and [facility], as well as of the fact that they are under no obligation to receive care at these facilities.  PASRR submitted to EDS on       PASRR number received on       Existing PASRR number confirmed on 09/16/16     FL2 transmitted to all facilities in geographic area requested by pt/family on 09/16/16     FL2 transmitted to all facilities within larger geographic area on       Patient informed that his/her managed care company has contracts with or will negotiate with certain facilities, including the following:        No (Sister had facility preference and CSW was contacted regarding patient and indicated that they can take her.)   Patient/family informed of bed offers received.  Patient/Family chooses bed at  [facility] in [place]     Physician recommends and patient chooses bed at      Patient to be transferred to  [facility] on  .  Patient to be transferred to facility by       Patient family notified on   of transfer.  Name  of family member notified:        PHYSICIAN      Additional Comment: 09/17/16 - CSW received insurance authorization for patient from [facility] - [id number]. Facility admissions director advised that authorization rec'd.    _______________________________________________ Sable Feil, LCSW 09/17/2016, 5:41 PM

## 2016-09-18 DIAGNOSIS — I10 Essential (primary) hypertension: Secondary | ICD-10-CM | POA: Diagnosis not present

## 2016-09-18 DIAGNOSIS — E785 Hyperlipidemia, unspecified: Secondary | ICD-10-CM | POA: Diagnosis not present

## 2016-09-18 DIAGNOSIS — G9340 Encephalopathy, unspecified: Secondary | ICD-10-CM | POA: Diagnosis not present

## 2016-09-18 DIAGNOSIS — R300 Dysuria: Secondary | ICD-10-CM | POA: Diagnosis not present

## 2016-09-18 DIAGNOSIS — E119 Type 2 diabetes mellitus without complications: Secondary | ICD-10-CM | POA: Diagnosis not present

## 2016-09-18 DIAGNOSIS — E872 Acidosis: Secondary | ICD-10-CM | POA: Diagnosis not present

## 2016-09-18 DIAGNOSIS — R1314 Dysphagia, pharyngoesophageal phase: Secondary | ICD-10-CM | POA: Diagnosis not present

## 2016-09-18 DIAGNOSIS — I69314 Frontal lobe and executive function deficit following cerebral infarction: Secondary | ICD-10-CM | POA: Diagnosis not present

## 2016-09-18 DIAGNOSIS — K219 Gastro-esophageal reflux disease without esophagitis: Secondary | ICD-10-CM | POA: Diagnosis not present

## 2016-09-18 DIAGNOSIS — F0390 Unspecified dementia without behavioral disturbance: Secondary | ICD-10-CM | POA: Diagnosis not present

## 2016-09-18 DIAGNOSIS — F0150 Vascular dementia without behavioral disturbance: Secondary | ICD-10-CM | POA: Diagnosis not present

## 2016-09-18 DIAGNOSIS — D649 Anemia, unspecified: Secondary | ICD-10-CM | POA: Diagnosis not present

## 2016-09-18 DIAGNOSIS — I129 Hypertensive chronic kidney disease with stage 1 through stage 4 chronic kidney disease, or unspecified chronic kidney disease: Secondary | ICD-10-CM | POA: Diagnosis not present

## 2016-09-18 DIAGNOSIS — K228 Other specified diseases of esophagus: Secondary | ICD-10-CM | POA: Diagnosis not present

## 2016-09-18 DIAGNOSIS — E46 Unspecified protein-calorie malnutrition: Secondary | ICD-10-CM | POA: Diagnosis not present

## 2016-09-18 DIAGNOSIS — I6310 Cerebral infarction due to embolism of unspecified precerebral artery: Secondary | ICD-10-CM

## 2016-09-18 DIAGNOSIS — R4182 Altered mental status, unspecified: Secondary | ICD-10-CM | POA: Diagnosis not present

## 2016-09-18 DIAGNOSIS — I69391 Dysphagia following cerebral infarction: Secondary | ICD-10-CM | POA: Diagnosis not present

## 2016-09-18 DIAGNOSIS — R410 Disorientation, unspecified: Secondary | ICD-10-CM | POA: Diagnosis not present

## 2016-09-18 DIAGNOSIS — E1140 Type 2 diabetes mellitus with diabetic neuropathy, unspecified: Secondary | ICD-10-CM | POA: Diagnosis not present

## 2016-09-18 DIAGNOSIS — E1110 Type 2 diabetes mellitus with ketoacidosis without coma: Secondary | ICD-10-CM | POA: Diagnosis not present

## 2016-09-18 DIAGNOSIS — E43 Unspecified severe protein-calorie malnutrition: Secondary | ICD-10-CM | POA: Diagnosis not present

## 2016-09-18 DIAGNOSIS — I6340 Cerebral infarction due to embolism of unspecified cerebral artery: Secondary | ICD-10-CM | POA: Diagnosis not present

## 2016-09-18 DIAGNOSIS — E876 Hypokalemia: Secondary | ICD-10-CM | POA: Diagnosis not present

## 2016-09-18 DIAGNOSIS — I69315 Cognitive social or emotional deficit following cerebral infarction: Secondary | ICD-10-CM | POA: Diagnosis not present

## 2016-09-18 DIAGNOSIS — I69390 Apraxia following cerebral infarction: Secondary | ICD-10-CM | POA: Diagnosis not present

## 2016-09-18 DIAGNOSIS — N183 Chronic kidney disease, stage 3 (moderate): Secondary | ICD-10-CM | POA: Diagnosis not present

## 2016-09-18 DIAGNOSIS — M6281 Muscle weakness (generalized): Secondary | ICD-10-CM | POA: Diagnosis not present

## 2016-09-18 DIAGNOSIS — I69398 Other sequelae of cerebral infarction: Secondary | ICD-10-CM | POA: Diagnosis not present

## 2016-09-18 DIAGNOSIS — Z794 Long term (current) use of insulin: Secondary | ICD-10-CM | POA: Diagnosis not present

## 2016-09-18 LAB — GLUCOSE, CAPILLARY
Glucose-Capillary: 147 mg/dL — ABNORMAL HIGH (ref 65–99)
Glucose-Capillary: 260 mg/dL — ABNORMAL HIGH (ref 65–99)

## 2016-09-18 LAB — BASIC METABOLIC PANEL
Anion gap: 11 (ref 5–15)
BUN: 12 mg/dL (ref 6–20)
CHLORIDE: 107 mmol/L (ref 101–111)
CO2: 24 mmol/L (ref 22–32)
Calcium: 9.3 mg/dL (ref 8.9–10.3)
Creatinine, Ser: 0.62 mg/dL (ref 0.44–1.00)
GFR calc non Af Amer: >60 mL/min (ref >60)
Glucose, Bld: 136 mg/dL — ABNORMAL HIGH (ref 65–99)
POTASSIUM: 3.2 mmol/L — AB (ref 3.5–5.1)
Sodium: 142 mmol/L (ref 135–145)

## 2016-09-18 LAB — CBC
HCT: 40.2 % (ref 36.0–46.0)
Hemoglobin: 13.5 g/dL (ref 12.0–15.0)
MCH: 30.6 pg (ref 26.0–34.0)
MCHC: 33.6 g/dL (ref 30.0–36.0)
MCV: 91.2 fL (ref 78.0–100.0)
Platelets: 232 10*3/uL (ref 150–400)
RBC: 4.41 MIL/uL (ref 3.87–5.11)
RDW: 13.9 % (ref 11.5–15.5)
WBC: 6.5 10*3/uL (ref 4.0–10.5)

## 2016-09-18 MED ORDER — METOPROLOL SUCCINATE ER 25 MG PO TB24: 25.0000 mg | ORAL_TABLET | Freq: Every day | ORAL | 1 refills | Status: AC

## 2016-09-18 MED ORDER — CLOPIDOGREL BISULFATE 75 MG PO TABS
75.0000 mg | ORAL_TABLET | Freq: Every day | ORAL | 1 refills | Status: DC
Start: 2016-09-19 — End: 2019-12-09

## 2016-09-18 MED ORDER — POTASSIUM CHLORIDE CRYS ER 20 MEQ PO TBCR
20.0000 meq | EXTENDED_RELEASE_TABLET | Freq: Every day | ORAL | 0 refills | Status: AC
Start: 2016-09-18 — End: ?

## 2016-09-18 MED ORDER — POTASSIUM CHLORIDE CRYS ER 20 MEQ PO TBCR
40.0000 meq | EXTENDED_RELEASE_TABLET | Freq: Once | ORAL | Status: AC
  Administered 2016-09-18: 40 meq via ORAL
  Filled 2016-09-18: qty 2

## 2016-09-18 MED ORDER — INSULIN GLARGINE 100 UNIT/ML ~~LOC~~ SOLN: 35.0000 [IU] | Freq: Every day | SUBCUTANEOUS | 11 refills | Status: DC

## 2016-09-18 NOTE — Care Management Important Message (Signed)
Important Message  Patient Details  Name: Katie Lozano MRN: [id number] Date of Birth: 03-13-1941   Medicare Important Message Given:  Yes    [name] [address]ine [address] 09/18/2016, 11:44 AM

## 2016-09-18 NOTE — Progress Notes (Signed)
Neurology Note  Stroke service (Dr. Leonie Man) apparently no longer following patient. Contacted by Dr. Allyson Sabal asking if she could be discharged. Chart reviewed.   It appears that Dr. Leonie Man feels that the findings of increased signal in the R parietal lobe on her MRI were artifactual in nature. As such, he is no longer seeing the patient. Her mental status has improved over the course of her admission and rehab services have recommended that she go to SNF for further rehab.   EEG showed mild diffuse generalized slowing consistent with mild encephalopathy.    Impression/plan: Her presentation is most consistent with toxic-metabolic encephalopathy in the setting of reduced cerebral reserve.  Treatment is supportive. Minimize CNS active meds, particularly benzos, opiates, and anything with strong anticholinergic properties.   She has improved and is ready for d/c to SNF. This is fine from the neurology perspective. No further recommendations.   D/w. Dr. Allyson Sabal.

## 2016-09-18 NOTE — Discharge Summary (Addendum)
Physician Discharge Summary  ALEJAH ARISTIZABAL MRN: [id number] DOB/AGE: 78-06-1940 78 y.o.  PCP: Phineas Inches, MD   Admit date: 09/14/2016 Discharge date: 09/18/2016  Discharge Diagnoses:    Principal Problem:   Acute encephalopathy Active Problems:   Type 2 diabetes mellitus with diabetic nephropathy, with long-term current use of insulin (HCC)   Essential hypertension   Esophageal necrosis   Dementia without behavioral disturbance   Protein-calorie malnutrition, severe   Cerebral embolism with cerebral infarction   Altered mental status    Follow-up recommendations Follow-up with PCP in 3-5 days , including all  additional recommended appointments as below Follow-up CBC, CMP in 3-5 days Patient would benefit from follow-up with outpatient neurology,Dr. Erlinda Hong MD tried to contact patient's daughter prior to discharge, patient's cell phone, voice mailbox was full. Therefore unable to leave a message to call back     Current Discharge Medication List    START taking these medications   Details  clopidogrel (PLAVIX) 75 MG tablet Take 1 tablet (75 mg total) by mouth daily. Qty: 30 tablet, Refills: 1    potassium chloride SA (K-DUR,KLOR-CON) 20 MEQ tablet Take 1 tablet (20 mEq total) by mouth daily. Qty: 1 tablet, Refills: 0      CONTINUE these medications which have CHANGED   Details  insulin glargine (LANTUS) 100 UNIT/ML injection Inject 0.35 mLs (35 Units total) into the skin at bedtime. Qty: 10 mL, Refills: 11    metoprolol succinate (TOPROL-XL) 25 MG 24 hr tablet Take 1 tablet (25 mg total) by mouth daily. Qty: 30 tablet, Refills: 1      CONTINUE these medications which have NOT CHANGED   Details  !! Cholecalciferol (D3 SUPER STRENGTH) 2000 UNITS CAPS Take 2,000 Units by mouth daily.     !! Cholecalciferol (VITAMIN D3) 2000 units capsule Take by mouth.    ezetimibe (ZETIA) 10 MG tablet Take 5 mg by mouth daily. Take 1/2 tablet to = 5 mg    insulin aspart  (NOVOLOG) 100 UNIT/ML injection Inject into the skin. Inject as per sliding scale: If 140 - 199 = 4 units If 200 - 250 = 6 units If 251 - 299 = 8 units  If 300 - 349 = 12 units If 350 - 400 = 16 units If above 400 call MD or NP.   Inject subcutaneously before meals for treatment of diabetes    lisinopril (PRINIVIL,ZESTRIL) 10 MG tablet Take 10 mg by mouth daily.     Multiple Vitamin (MULTI VITAMIN DAILY) TABS Take 1 tablet by mouth daily.     pantoprazole (PROTONIX) 40 MG tablet Take 1 tablet (40 mg total) by mouth 2 (two) times daily. Switch for any other PPI at similar dose and frequency Qty: 60 tablet, Refills: 0    simvastatin (ZOCOR) 20 MG tablet Take 20 mg by mouth daily.     sucralfate (CARAFATE) 1 GM/10ML suspension Take 10 mLs (1 g total) by mouth every 6 (six) hours. Qty: 420 mL, Refills: 0    acetaminophen (TYLENOL) 325 MG tablet Take 650 mg by mouth every 4 (four) hours as needed for fever.     !! - Potential duplicate medications found. Please discuss with provider.    STOP taking these medications     sucralfate (CARAFATE) 1 GM/10ML suspension          Discharge Condition: Stable   Discharge Instructions Get Medicines reviewed and adjusted: Please take all your medications with you for your next visit with your Primary  MD  Please request your Primary MD to go over all hospital tests and procedure/radiological results at the follow up, please ask your Primary MD to get all Hospital records sent to his/her office.  If you experience worsening of your admission symptoms, develop shortness of breath, life threatening emergency, suicidal or homicidal thoughts you must seek medical attention immediately by calling 911 or calling your MD immediately if symptoms less severe.  You must read complete instructions/literature along with all the possible adverse reactions/side effects for all the Medicines you take and that have been prescribed to you. Take any new  Medicines after you have completely understood and accpet all the possible adverse reactions/side effects.   Do not drive when taking Pain medications.   Do not take more than prescribed Pain, Sleep and Anxiety Medications  Special Instructions: If you have smoked or chewed Tobacco in the last 2 yrs please stop smoking, stop any regular Alcohol and or any Recreational drug use.  Wear Seat belts while driving.  Please note  You were cared for by a hospitalist during your hospital stay. Once you are discharged, your primary care physician will handle any further medical issues. Please note that NO REFILLS for any discharge medications will be authorized once you are discharged, as it is imperative that you return to your primary care physician (or establish a relationship with a primary care physician if you do not have one) for your aftercare needs so that they can reassess your need for medications and monitor your lab values.  Discharge Instructions    Diet - low sodium heart healthy    Complete by:  As directed    Increase activity slowly    Complete by:  As directed        No Known Allergies    Disposition: 03-Skilled Nursing Facility   Consults: * Neurology    Significant Diagnostic Studies:  Ct Angio Head W Or Wo Contrast  Result Date: 09/16/2016 CLINICAL DATA:  Confusion, altered mental status. EXAM: CT ANGIOGRAPHY HEAD TECHNIQUE: Multidetector CT imaging of the head was performed using the standard protocol during bolus administration of intravenous contrast. Multiplanar CT image reconstructions and MIPs were obtained to evaluate the vascular anatomy. CONTRAST:  50 mL Isovue 370 IV COMPARISON:  Brain MRI 09/14/2016 Head CT 09/14/2016 FINDINGS: CT HEAD Brain: No mass lesion, intraparenchymal hemorrhage or extra-axial collection. No evidence of acute cortical infarct. There is periventricular hypoattenuation compatible with chronic microvascular disease. Vascular: No  hyperdense vessel or unexpected calcification. Skull: Normal visualized skull base, calvarium and extracranial soft tissues. Sinuses/Orbits: No sinus fluid levels or advanced mucosal thickening. No mastoid effusion. Normal orbits. CTA HEAD Anterior circulation: --Intracranial internal carotid arteries: There is atherosclerotic calcification within the cavernous segments of both internal carotid arteries without hemodynamically significant stenosis. --Anterior cerebral arteries: Normal. --Middle cerebral arteries: Normal. --Posterior communicating arteries: Present on the right. Posterior circulation: --Posterior cerebral arteries: Normal. --Superior cerebellar arteries: Normal. --Basilar artery: Normal. --Anterior inferior cerebellar arteries: Normal on the right. Not clearly seen on the left. --Posterior inferior cerebellar arteries: Normal. Venous sinuses: As permitted by contrast timing, patent. Anatomic variants: None. Delayed phase: No abnormal intracranial enhancement. IMPRESSION: 1. Normal CTA of the brain. 2. Chronic microvascular ischemia without acute intracranial abnormality. Electronically Signed   By: Ulyses Jarred M.D.   On: 09/16/2016 22:43   Dg Chest 2 View  Result Date: 09/14/2016 CLINICAL DATA:  76 year old female with confusion. Difficulty walking. EXAM: CHEST  2 VIEW COMPARISON:  Chest x-ray  07/11/2016. FINDINGS: Diffuse peribronchial cuffing. Lung volumes are normal. No consolidative airspace disease. No pleural effusions. No pneumothorax. No pulmonary nodule or mass noted. Pulmonary vasculature and the cardiomediastinal silhouette are within normal limits. Aortic atherosclerosis. IMPRESSION: 1. Diffuse peribronchial cuffing, concerning for an acute bronchitis. 2. Aortic atherosclerosis. Electronically Signed   By: Vinnie Langton M.D.   On: 09/14/2016 11:51   Ct Head Wo Contrast  Result Date: 09/14/2016 CLINICAL DATA:  Altered mental status this morning. Difficulty walking. EXAM: CT  HEAD WITHOUT CONTRAST TECHNIQUE: Contiguous axial images were obtained from the base of the skull through the vertex without intravenous contrast. COMPARISON:  06/05/2016 FINDINGS: Brain: Ventricles, cisterns and other CSF spaces are within normal. Mild chronic ischemic microvascular disease is present. There is no mass, mass effect, shift of midline structures or acute hemorrhage. No evidence to suggest acute infarction. Vascular: Calcified plaque over the cavernous segment of the internal carotid arteries. Skull: Within normal. Sinuses/Orbits: Within normal. Other: None. IMPRESSION: No acute intracranial findings. Chronic ischemic microvascular disease. Electronically Signed   By: Marin Olp M.D.   On: 09/14/2016 11:53   Mr Brain Wo Contrast  Result Date: 09/14/2016 CLINICAL DATA:  Dysphasia.  Confusion. EXAM: MRI HEAD WITHOUT CONTRAST TECHNIQUE: Multiplanar, multiecho pulse sequences of the brain and surrounding structures were obtained without intravenous contrast. COMPARISON:  Head CT 09/14/2016 and MRI 06/06/2016 FINDINGS: The study is motion degraded throughout, moderately so on the axial T1 and coronal T2 sequences. Brain: There is no evidence of acute infarct, intracranial hemorrhage, mass, midline shift, or extra-axial fluid collection. Mild to moderate cerebral atrophy is unchanged. A prominent cisterna magna is noted. Scattered foci of T2 hyperintensity in the subcortical and deep cerebral white matter bilaterally are similar to the prior MRI and nonspecific but compatible with mild-to-moderate chronic small vessel ischemic disease. There is a new 1.2 cm focus of gyral T2 hyperintensity in the right parietal lobe (series 7, image 21) which demonstrates facilitated diffusion. Vascular: Major intracranial vascular flow voids are preserved. Skull and upper cervical spine: Unremarkable bone marrow signal. Sinuses/Orbits: Unremarkable orbits. Clear paranasal sinuses. Small right mastoid effusion.  Other: Similar appearance of suspected enlarged lymph nodes in the right upper neck compared to the prior MRI. IMPRESSION: 1. Motion degraded examination.  No acute infarct. 2. New focus of gyral signal abnormality in the right parietal lobe which may reflect a small subacute cortical infarct. 3. Mild to moderate chronic small vessel ischemic disease. Electronically Signed   By: Logan Bores M.D.   On: 09/14/2016 18:57             Filed Weights   09/15/16 0416 09/16/16 2223 09/17/16 2135  Weight: 61.7 kg (136 lb) 61.4 kg (135 lb 5.8 oz) 61 kg (134 lb 7.7 oz)     Microbiology: Recent Results (from the past 240 hour(s))  MRSA PCR Screening     Status: None   Collection Time: 09/14/16  4:10 PM  Result Value Ref Range Status   MRSA by PCR NEGATIVE NEGATIVE Final    Comment:        The GeneXpert MRSA Assay (FDA approved for NASAL specimens only), is one component of a comprehensive MRSA colonization surveillance program. It is not intended to diagnose MRSA infection nor to guide or monitor treatment for MRSA infections.        Blood Culture    Component Value Date/Time   SDES URINE, CATHETERIZED 07/11/2016 2023   SPECREQUEST NONE 07/11/2016 2023   CULT NO GROWTH  07/11/2016 2023   REPTSTATUS 07/13/2016 FINAL 07/11/2016 2023      Labs: Results for orders placed or performed during the hospital encounter of 09/14/16 (from the past 48 hour(s))  Glucose, capillary     Status: Abnormal   Collection Time: 09/16/16 12:16 PM  Result Value Ref Range   Glucose-Capillary 243 (H) 65 - 99 mg/dL  Glucose, capillary     Status: Abnormal   Collection Time: 09/16/16  5:13 PM  Result Value Ref Range   Glucose-Capillary 206 (H) 65 - 99 mg/dL  Glucose, capillary     Status: Abnormal   Collection Time: 09/16/16  9:54 PM  Result Value Ref Range   Glucose-Capillary 349 (H) 65 - 99 mg/dL  Basic metabolic panel     Status: Abnormal   Collection Time: 09/17/16  7:01 AM  Result Value  Ref Range   Sodium 136 135 - 145 mmol/L   Potassium 3.8 3.5 - 5.1 mmol/L   Chloride 106 101 - 111 mmol/L   CO2 20 (L) 22 - 32 mmol/L   Glucose, Bld 374 (H) 65 - 99 mg/dL   BUN 9 6 - 20 mg/dL   Creatinine, Ser 0.72 0.44 - 1.00 mg/dL   Calcium 9.0 8.9 - 10.3 mg/dL   GFR calc non Af Amer >60 >60 mL/min   GFR calc Af Amer >60 >60 mL/min    Comment: (NOTE) The eGFR has been calculated using the CKD EPI equation. This calculation has not been validated in all clinical situations. eGFR's persistently <60 mL/min signify possible Chronic Kidney Disease.    Anion gap 10 5 - 15  CBC     Status: None   Collection Time: 09/17/16  7:01 AM  Result Value Ref Range   WBC 7.8 4.0 - 10.5 K/uL   RBC 4.27 3.87 - 5.11 MIL/uL   Hemoglobin 13.3 12.0 - 15.0 g/dL   HCT 39.2 36.0 - 46.0 %   MCV 91.8 78.0 - 100.0 fL   MCH 31.1 26.0 - 34.0 pg   MCHC 33.9 30.0 - 36.0 g/dL   RDW 13.9 11.5 - 15.5 %   Platelets 198 150 - 400 K/uL  Glucose, capillary     Status: Abnormal   Collection Time: 09/17/16  8:36 AM  Result Value Ref Range   Glucose-Capillary 380 (H) 65 - 99 mg/dL  Comprehensive metabolic panel     Status: Abnormal   Collection Time: 09/17/16  8:38 AM  Result Value Ref Range   Sodium 137 135 - 145 mmol/L   Potassium 4.1 3.5 - 5.1 mmol/L   Chloride 104 101 - 111 mmol/L   CO2 21 (L) 22 - 32 mmol/L   Glucose, Bld 405 (H) 65 - 99 mg/dL   BUN 10 6 - 20 mg/dL   Creatinine, Ser 0.76 0.44 - 1.00 mg/dL   Calcium 9.3 8.9 - 10.3 mg/dL   Total Protein 6.8 6.5 - 8.1 g/dL   Albumin 3.4 (L) 3.5 - 5.0 g/dL   AST 24 15 - 41 U/L   ALT 16 14 - 54 U/L   Alkaline Phosphatase 65 38 - 126 U/L   Total Bilirubin 0.9 0.3 - 1.2 mg/dL   GFR calc non Af Amer >60 >60 mL/min   GFR calc Af Amer >60 >60 mL/min    Comment: (NOTE) The eGFR has been calculated using the CKD EPI equation. This calculation has not been validated in all clinical situations. eGFR's persistently <60 mL/min signify possible Chronic  Kidney Disease.  Anion gap 12 5 - 15  Glucose, capillary     Status: Abnormal   Collection Time: 09/17/16 11:36 AM  Result Value Ref Range   Glucose-Capillary 343 (H) 65 - 99 mg/dL  Glucose, capillary     Status: Abnormal   Collection Time: 09/17/16  5:26 PM  Result Value Ref Range   Glucose-Capillary 228 (H) 65 - 99 mg/dL  Glucose, capillary     Status: Abnormal   Collection Time: 09/17/16  9:33 PM  Result Value Ref Range   Glucose-Capillary 196 (H) 65 - 99 mg/dL  Basic metabolic panel     Status: Abnormal   Collection Time: 09/18/16  6:15 AM  Result Value Ref Range   Sodium 142 135 - 145 mmol/L   Potassium 3.2 (L) 3.5 - 5.1 mmol/L    Comment: NO VISIBLE HEMOLYSIS   Chloride 107 101 - 111 mmol/L   CO2 24 22 - 32 mmol/L   Glucose, Bld 136 (H) 65 - 99 mg/dL   BUN 12 6 - 20 mg/dL   Creatinine, Ser 0.62 0.44 - 1.00 mg/dL   Calcium 9.3 8.9 - 10.3 mg/dL   GFR calc non Af Amer >60 >60 mL/min   GFR calc Af Amer >60 >60 mL/min    Comment: (NOTE) The eGFR has been calculated using the CKD EPI equation. This calculation has not been validated in all clinical situations. eGFR's persistently <60 mL/min signify possible Chronic Kidney Disease.    Anion gap 11 5 - 15  CBC     Status: None   Collection Time: 09/18/16  6:15 AM  Result Value Ref Range   WBC 6.5 4.0 - 10.5 K/uL   RBC 4.41 3.87 - 5.11 MIL/uL   Hemoglobin 13.5 12.0 - 15.0 g/dL   HCT 40.2 36.0 - 46.0 %   MCV 91.2 78.0 - 100.0 fL   MCH 30.6 26.0 - 34.0 pg   MCHC 33.6 30.0 - 36.0 g/dL   RDW 13.9 11.5 - 15.5 %   Platelets 232 150 - 400 K/uL  Glucose, capillary     Status: Abnormal   Collection Time: 09/18/16  8:03 AM  Result Value Ref Range   Glucose-Capillary 147 (H) 65 - 99 mg/dL   Comment 1 Notify RN    Comment 2 Document in Chart      Lipid Panel     Component Value Date/Time   CHOL 107 09/16/2016 0511   TRIG 200 (H) 09/16/2016 0511   HDL 26 (L) 09/16/2016 0511   CHOLHDL 4.1 09/16/2016 0511   VLDL 40  09/16/2016 0511   LDLCALC 41 09/16/2016 0511     Lab Results  Component Value Date   HGBA1C 7.9 (H) 09/14/2016   HGBA1C 8.8 (H) 07/11/2016   HGBA1C 8.2 (H) 06/05/2016        HPI :  Katie Lozano a 77 y.o.femalewith medical history significant of IDDM, HTN, history of TIA, recent hospitalization of January 2018 secondary to respiratory failure, DKA and esophageal necrosis. Patient was in a nursing home, then discharge to ALF. All patient history obtained from sister at bedside and EDP notes, patient is confused, disoriented and could not provide any meaningful history. Per sister at bedside she started to have confusion about 2 weeks ago, likely not taking her medication, oneday she was found sleeping on the floor, this is got progressively worse to the point she couldn't carry any conversation so she brought her to [facility] for further evaluation, her labs  looks okay referred for hospital evaluation for encephalopathy/CVA workup.    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  Stroke team disagrees that the patient has had an acute or subacute stroke Patient will be discharged to SNF today The likely benefit from outpatient neurology follow-up EEG showed mild diffuse generalized slowing consistent with mild encephalopathy.   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 Treatment is supportive.  Minimize CNS active meds, particularly benzos, opiates, and anything with strong anticholinergic properties  ready for d/c to SNF. This is fine from the neurology perspective. No further recommendations.   Subacute CVA MRI showed subacute infarct, neurologically evaluate. Has history of multiple TIAs, she was on Plavix which was recently discontinued because of recent esophageal necrosis. CVA workup, we did not repeat carotid Doppler , as it was done on 12/17 and echo done on 1/18. PT/OT recommended SNF. CSW consulted.  Type 2 diabetes, uncontrolled with hyperglycemia.  Accu-Cheks remain uncontrolled, no evidence of DKA. A1C 7.9 (09/14/16). Increased Lantus to 35 units   Hypokalemia. Replete, patient started on potassium supplementation    Recent history of GI bleed and esophageal necrosis. Appears to be stable, no complaints of any epigastric abdominal pain, hemoglobin of 12.9. Continue PPIs  , recommend follow-up EGD as outpatient.Neurology recommends to change ASA to plavix .Plavix monotherapy is safe from a GI perspective  Essential Hypertension.  . Continue lisinopril and metoprolol.   Dementia without behavioral disturbances. On admission, sister at bedside reported she had memory problems before this acute worsening of cognitive function. She has risk factor for vascular dementia, diabetes, hypertension and history of recurrent TIAs. LDL 12/67/17  is 48. MRI shows mild/moderate small vessel ischemia disease. 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  Continues to have nonsensical speech. MRI as above. Patient followed by Dr. Erlinda Hong  , last visit  on 09/02/16.       Discharge Exam:   Blood pressure (!) 135/57, pulse 65, temperature 98.9 F (37.2 C), temperature source Oral, resp. rate 18, height 5' 2.5" (1.588 m), weight 61 kg (134 lb 7.7 oz), SpO2 95 %.  General exam: Appears calm and comfortable  Respiratory system: Clear to auscultation. Respiratory  effort normal. Cardiovascular system: S1 & S2 heard, RRR. No JVD, murmurs, rubs, gallops or clicks. 1+ pedal edema. Gastrointestinal system: Abdomen is nondistended, soft and nontender. No organomegaly or masses felt. Central nervous system: Alert and oriented. No focal neurological deficits. Extremities: Symmetric 5 x 5 power. Skin: No rashes, lesions or ulcers Psychiatry: Patient is very confused. Scored 10 on MMS exam.    Follow-up Information    BOUSKA,DAVID E, MD. Call.   Specialty:  Family Medicine Why:  HOSPITAL FOLLOW UP Contact information: [place] [id number] [phone]           Signed: Reyne Dumas 09/18/2016, 10:41 AM        Time spent >45 mins

## 2016-09-18 NOTE — Progress Notes (Signed)
Inpatient Diabetes Program Recommendations  AACE/ADA: New Consensus Statement on Inpatient Glycemic Control (2015)  Target Ranges:  Prepandial:   less than 140 mg/dL      Peak postprandial:   less than 180 mg/dL (1-2 hours)      Critically ill patients:  140 - 180 mg/dL   Lab Results  Component Value Date   GLUCAP 260 (H) 09/18/2016   HGBA1C 7.9 (H) 09/14/2016    Review of Glycemic Control  Post-prandial blood sugars elevated.  Inpatient Diabetes Program Recommendations:    Add Novolog 3 units tidwc for meal coverage insulin.  Will continue to follow.  Thank you. Lorenda Peck, RD, LDN, CDE Inpatient Diabetes Coordinator [phone]

## 2016-09-18 NOTE — Progress Notes (Signed)
Informed MD Abrol of K= 3.2 this AM.   Will continue to monitor  Paulla Fore, RN

## 2016-09-18 NOTE — Progress Notes (Signed)
Katie Lozano to be D/C'd Skilled nursing facility per MD order.  Discussed prescriptions and follow up appointments with the patient. Prescriptions given to patient, medication list explained in detail. Pt verbalized understanding. Called report to Roxy Horseman, spoke to [employer] .   Allergies as of 09/18/2016   No Known Allergies     Medication List    TAKE these medications   acetaminophen 325 MG tablet Commonly known as:  TYLENOL Take 650 mg by mouth every 4 (four) hours as needed for fever.   clopidogrel 75 MG tablet Commonly known as:  PLAVIX Take 1 tablet (75 mg total) by mouth daily. Start taking on:  09/19/2016   D3 SUPER STRENGTH 2000 units Caps Generic drug:  Cholecalciferol Take 2,000 Units by mouth daily.   Vitamin D3 2000 units capsule Take by mouth.   ezetimibe 10 MG tablet Commonly known as:  ZETIA Take 5 mg by mouth daily. Take 1/2 tablet to = 5 mg   insulin aspart 100 UNIT/ML injection Commonly known as:  novoLOG Inject into the skin. Inject as per sliding scale: If 140 - 199 = 4 units If 200 - 250 = 6 units If 251 - 299 = 8 units  If 300 - 349 = 12 units If 350 - 400 = 16 units If above 400 call MD or NP.   Inject subcutaneously before meals for treatment of diabetes   insulin glargine 100 UNIT/ML injection Commonly known as:  LANTUS Inject 0.35 mLs (35 Units total) into the skin at bedtime. What changed:  how much to take   lisinopril 10 MG tablet Commonly known as:  PRINIVIL,ZESTRIL Take 10 mg by mouth daily.   metoprolol succinate 25 MG 24 hr tablet Commonly known as:  TOPROL-XL Take 1 tablet (25 mg total) by mouth daily. Start taking on:  09/19/2016 What changed:  how much to take  when to take this   MULTI VITAMIN DAILY Tabs Take 1 tablet by mouth daily.   pantoprazole 40 MG tablet Commonly known as:  PROTONIX Take 1 tablet (40 mg total) by mouth 2 (two) times daily. Switch for any other PPI at similar dose and frequency   potassium  chloride SA 20 MEQ tablet Commonly known as:  K-DUR,KLOR-CON Take 1 tablet (20 mEq total) by mouth daily.   simvastatin 20 MG tablet Commonly known as:  ZOCOR Take 20 mg by mouth daily.   sucralfate 1 GM/10ML suspension Commonly known as:  CARAFATE Take 10 mLs (1 g total) by mouth every 6 (six) hours.       Vitals:   09/18/16 0700 09/18/16 1007  BP: (!) 156/81 (!) 135/57  Pulse: 63 65  Resp: 18   Temp: 98.9 F (37.2 C)     Skin clean, dry and intact without evidence of skin break down, no evidence of skin tears noted. IV catheter discontinued intact. Site without signs and symptoms of complications. Dressing and pressure applied. Pt denies pain at this time. No complaints noted.  An After Visit Summary was printed and given to the patient. Patient escorted via [place], and D/C to [facility] via private auto.  Emilio Math, RN [facility] Phone [phone]

## 2016-09-18 NOTE — Care Management Important Message (Signed)
Important Message  Patient Details  Name: Katie Lozano MRN: [id number] Date of Birth: December 01, 1940   Medicare Important Message Given:  Yes    Bryndle Corredor Montine Circle 09/18/2016, 11:44 AM

## 2016-09-18 NOTE — Clinical Social Work Placement (Signed)
   CLINICAL SOCIAL WORK PLACEMENT  NOTE 09/18/16 - DISCHARGED TO [facility]  Date:  09/18/2016  Patient Details  Name: Katie Lozano MRN: [id number] Date of Birth: April 28, 1941  Clinical Social Work is seeking post-discharge placement for this patient at the [place] level of care (*CSW will initial, date and re-position this form in  chart as items are completed):  No (Sister had facility preference)   Patient/family provided with [facility] Work Department's list of facilities offering this level of care within the geographic area requested by the patient (or if unable, by the patient's family).  Yes   Patient/family informed of their freedom to choose among providers that offer the needed level of care, that participate in Medicare, Medicaid or managed care program needed by the patient, have an available bed and are willing to accept the patient.  No   Patient/family informed of [facility]'s ownership interest in [facility] and [facility], as well as of the fact that they are under no obligation to receive care at these facilities.  PASRR submitted to EDS on       PASRR number received on       Existing PASRR number confirmed on 09/16/16     FL2 transmitted to all facilities in geographic area requested by pt/family on 09/16/16     FL2 transmitted to all facilities within larger geographic area on       Patient informed that his/her managed care company has contracts with or will negotiate with certain facilities, including the following:        No (Sister had facility preference and CSW was contacted regarding patient and indicated that they can take her.)   Patient/family informed of bed offers received.  Patient chooses bed at  [facility]     Physician recommends and patient chooses bed at      Patient to be transferred to  [facility] on  09/18/16.  Patient to be transferred to facility by  family      Patient family notified on  09/18/16 of transfer.  Name of family member notified:   Nelle Parlier at the bedside.     PHYSICIAN       Additional Comment:    _______________________________________________ Sable Feil, LCSW 09/18/2016, 2:04 PM

## 2016-09-22 DIAGNOSIS — R300 Dysuria: Secondary | ICD-10-CM | POA: Diagnosis not present

## 2016-09-22 DIAGNOSIS — E119 Type 2 diabetes mellitus without complications: Secondary | ICD-10-CM | POA: Diagnosis not present

## 2016-09-22 DIAGNOSIS — K219 Gastro-esophageal reflux disease without esophagitis: Secondary | ICD-10-CM | POA: Diagnosis not present

## 2016-09-22 DIAGNOSIS — I10 Essential (primary) hypertension: Secondary | ICD-10-CM | POA: Diagnosis not present

## 2016-09-22 DIAGNOSIS — E785 Hyperlipidemia, unspecified: Secondary | ICD-10-CM | POA: Diagnosis not present

## 2016-09-22 DIAGNOSIS — E876 Hypokalemia: Secondary | ICD-10-CM | POA: Diagnosis not present

## 2016-09-24 DIAGNOSIS — F0390 Unspecified dementia without behavioral disturbance: Secondary | ICD-10-CM | POA: Diagnosis not present

## 2016-09-24 DIAGNOSIS — E46 Unspecified protein-calorie malnutrition: Secondary | ICD-10-CM | POA: Diagnosis not present

## 2016-09-24 DIAGNOSIS — R4182 Altered mental status, unspecified: Secondary | ICD-10-CM | POA: Diagnosis not present

## 2016-09-24 DIAGNOSIS — E119 Type 2 diabetes mellitus without complications: Secondary | ICD-10-CM | POA: Diagnosis not present

## 2016-09-24 DIAGNOSIS — I6340 Cerebral infarction due to embolism of unspecified cerebral artery: Secondary | ICD-10-CM | POA: Diagnosis not present

## 2016-09-24 DIAGNOSIS — I10 Essential (primary) hypertension: Secondary | ICD-10-CM | POA: Diagnosis not present

## 2016-09-24 DIAGNOSIS — K228 Other specified diseases of esophagus: Secondary | ICD-10-CM | POA: Diagnosis not present

## 2016-09-26 DIAGNOSIS — R410 Disorientation, unspecified: Secondary | ICD-10-CM | POA: Diagnosis not present

## 2016-09-26 DIAGNOSIS — E119 Type 2 diabetes mellitus without complications: Secondary | ICD-10-CM | POA: Diagnosis not present

## 2016-09-28 DIAGNOSIS — Z794 Long term (current) use of insulin: Secondary | ICD-10-CM | POA: Diagnosis not present

## 2016-09-28 DIAGNOSIS — E1165 Type 2 diabetes mellitus with hyperglycemia: Secondary | ICD-10-CM | POA: Diagnosis not present

## 2016-09-28 DIAGNOSIS — I69391 Dysphagia following cerebral infarction: Secondary | ICD-10-CM | POA: Diagnosis not present

## 2016-09-28 DIAGNOSIS — I129 Hypertensive chronic kidney disease with stage 1 through stage 4 chronic kidney disease, or unspecified chronic kidney disease: Secondary | ICD-10-CM | POA: Diagnosis not present

## 2016-09-28 DIAGNOSIS — R410 Disorientation, unspecified: Secondary | ICD-10-CM | POA: Diagnosis not present

## 2016-09-28 DIAGNOSIS — E872 Acidosis: Secondary | ICD-10-CM | POA: Diagnosis not present

## 2016-09-28 DIAGNOSIS — N183 Chronic kidney disease, stage 3 (moderate): Secondary | ICD-10-CM | POA: Diagnosis not present

## 2016-09-28 DIAGNOSIS — E785 Hyperlipidemia, unspecified: Secondary | ICD-10-CM | POA: Diagnosis not present

## 2016-09-28 DIAGNOSIS — I69390 Apraxia following cerebral infarction: Secondary | ICD-10-CM | POA: Diagnosis not present

## 2016-09-28 DIAGNOSIS — I69398 Other sequelae of cerebral infarction: Secondary | ICD-10-CM | POA: Diagnosis not present

## 2016-09-28 DIAGNOSIS — E876 Hypokalemia: Secondary | ICD-10-CM | POA: Diagnosis not present

## 2016-09-28 DIAGNOSIS — K219 Gastro-esophageal reflux disease without esophagitis: Secondary | ICD-10-CM | POA: Diagnosis not present

## 2016-09-28 DIAGNOSIS — I639 Cerebral infarction, unspecified: Secondary | ICD-10-CM | POA: Diagnosis not present

## 2016-09-28 DIAGNOSIS — I69314 Frontal lobe and executive function deficit following cerebral infarction: Secondary | ICD-10-CM | POA: Diagnosis not present

## 2016-09-28 DIAGNOSIS — E1140 Type 2 diabetes mellitus with diabetic neuropathy, unspecified: Secondary | ICD-10-CM | POA: Diagnosis not present

## 2016-09-28 DIAGNOSIS — M6281 Muscle weakness (generalized): Secondary | ICD-10-CM | POA: Diagnosis not present

## 2016-09-28 DIAGNOSIS — R739 Hyperglycemia, unspecified: Secondary | ICD-10-CM | POA: Diagnosis not present

## 2016-09-28 DIAGNOSIS — E1110 Type 2 diabetes mellitus with ketoacidosis without coma: Secondary | ICD-10-CM | POA: Diagnosis not present

## 2016-09-28 DIAGNOSIS — K228 Other specified diseases of esophagus: Secondary | ICD-10-CM | POA: Diagnosis not present

## 2016-09-28 DIAGNOSIS — R1314 Dysphagia, pharyngoesophageal phase: Secondary | ICD-10-CM | POA: Diagnosis not present

## 2016-09-28 DIAGNOSIS — I69315 Cognitive social or emotional deficit following cerebral infarction: Secondary | ICD-10-CM | POA: Diagnosis not present

## 2016-09-28 DIAGNOSIS — N390 Urinary tract infection, site not specified: Secondary | ICD-10-CM | POA: Diagnosis not present

## 2016-09-28 DIAGNOSIS — I10 Essential (primary) hypertension: Secondary | ICD-10-CM | POA: Diagnosis not present

## 2016-09-28 DIAGNOSIS — E43 Unspecified severe protein-calorie malnutrition: Secondary | ICD-10-CM | POA: Diagnosis not present

## 2016-09-28 DIAGNOSIS — D649 Anemia, unspecified: Secondary | ICD-10-CM | POA: Diagnosis not present

## 2016-09-29 DIAGNOSIS — N390 Urinary tract infection, site not specified: Secondary | ICD-10-CM | POA: Diagnosis not present

## 2016-09-29 DIAGNOSIS — I10 Essential (primary) hypertension: Secondary | ICD-10-CM | POA: Diagnosis not present

## 2016-09-29 DIAGNOSIS — I639 Cerebral infarction, unspecified: Secondary | ICD-10-CM | POA: Diagnosis not present

## 2016-09-29 DIAGNOSIS — E785 Hyperlipidemia, unspecified: Secondary | ICD-10-CM | POA: Diagnosis not present

## 2016-09-29 DIAGNOSIS — K219 Gastro-esophageal reflux disease without esophagitis: Secondary | ICD-10-CM | POA: Diagnosis not present

## 2016-09-29 DIAGNOSIS — E1165 Type 2 diabetes mellitus with hyperglycemia: Secondary | ICD-10-CM | POA: Diagnosis not present

## 2016-10-01 DIAGNOSIS — R739 Hyperglycemia, unspecified: Secondary | ICD-10-CM | POA: Diagnosis not present

## 2016-10-01 DIAGNOSIS — R410 Disorientation, unspecified: Secondary | ICD-10-CM | POA: Diagnosis not present

## 2016-10-10 DIAGNOSIS — R21 Rash and other nonspecific skin eruption: Secondary | ICD-10-CM | POA: Diagnosis not present

## 2016-10-14 DIAGNOSIS — L853 Xerosis cutis: Secondary | ICD-10-CM | POA: Diagnosis not present

## 2016-10-14 DIAGNOSIS — I639 Cerebral infarction, unspecified: Secondary | ICD-10-CM | POA: Diagnosis not present

## 2016-10-14 DIAGNOSIS — F0390 Unspecified dementia without behavioral disturbance: Secondary | ICD-10-CM | POA: Diagnosis not present

## 2016-10-14 DIAGNOSIS — I10 Essential (primary) hypertension: Secondary | ICD-10-CM | POA: Diagnosis not present

## 2016-11-05 ENCOUNTER — Telehealth: Payer: Self-pay | Admitting: Gastroenterology

## 2016-11-05 NOTE — Telephone Encounter (Signed)
Patient sister calling back regarding this. Best [phone]

## 2016-11-05 NOTE — Telephone Encounter (Signed)
Spoke to patient's sister, giving an update on patient. She went back into the hospital, diagnosed having had a stroke, dementia. She is now living permanently at a SNF[facility] in [place]. She was supposed to have a follow up EGD on 5/15 at the hospital. Since she just had this stroke and looks like she is on Plavix, diabetes is just now under controlled, do I need to reschedule her follow up EGD? Nelle, sister who's the HCPOA, would like to put this off if possible.

## 2016-11-05 NOTE — Telephone Encounter (Signed)
Agree, this should be cancelled given recent CVA. If she is in poor health and high risk to stop plavix and put her through another procedure, likely will not recommend follow up EGD (done to re-evaluated acute esophageal necrosis noted on EGD during prior admission in January). If she recovers from recent CVA and wants to see me in clinic at some point for a follow up that's fine, I can reassess her and discuss with the patient's family. Thanks

## 2016-11-06 NOTE — Telephone Encounter (Signed)
Spoke to patient's sister, Cala Bradford, let her know that Dr. Havery Moros is in agreement to cancel her repeat EGD. She understands to call later to schedule a follow up visit. She understands to call with any questions or concerns.

## 2016-11-11 ENCOUNTER — Ambulatory Visit (HOSPITAL_COMMUNITY): Admit: 2016-11-11 | Payer: PPO | Admitting: Gastroenterology

## 2016-11-11 ENCOUNTER — Encounter (HOSPITAL_COMMUNITY): Payer: Self-pay

## 2016-11-11 SURGERY — EGD (ESOPHAGOGASTRODUODENOSCOPY)
Anesthesia: Moderate Sedation

## 2016-11-15 DIAGNOSIS — Z79899 Other long term (current) drug therapy: Secondary | ICD-10-CM | POA: Diagnosis not present

## 2016-11-15 DIAGNOSIS — R6889 Other general symptoms and signs: Secondary | ICD-10-CM | POA: Diagnosis not present

## 2016-11-15 DIAGNOSIS — N390 Urinary tract infection, site not specified: Secondary | ICD-10-CM | POA: Diagnosis not present

## 2016-11-15 DIAGNOSIS — E119 Type 2 diabetes mellitus without complications: Secondary | ICD-10-CM | POA: Diagnosis not present

## 2016-11-26 DIAGNOSIS — E1101 Type 2 diabetes mellitus with hyperosmolarity with coma: Secondary | ICD-10-CM | POA: Diagnosis not present

## 2016-11-26 DIAGNOSIS — R6889 Other general symptoms and signs: Secondary | ICD-10-CM | POA: Diagnosis not present

## 2016-11-26 DIAGNOSIS — E119 Type 2 diabetes mellitus without complications: Secondary | ICD-10-CM | POA: Diagnosis not present

## 2016-11-29 NOTE — Addendum Note (Signed)
Addendum  created 11/29/16 0814 by Ziomara Birenbaum, MD   Sign clinical note    

## 2017-01-05 ENCOUNTER — Ambulatory Visit: Payer: PPO | Admitting: Neurology

## 2017-03-17 ENCOUNTER — Ambulatory Visit: Payer: PPO | Admitting: Gastroenterology

## 2017-05-05 ENCOUNTER — Ambulatory Visit (INDEPENDENT_AMBULATORY_CARE_PROVIDER_SITE_OTHER): Payer: Medicare (Managed Care) | Admitting: Gastroenterology

## 2017-05-05 ENCOUNTER — Encounter: Payer: Self-pay | Admitting: Gastroenterology

## 2017-05-05 ENCOUNTER — Encounter (INDEPENDENT_AMBULATORY_CARE_PROVIDER_SITE_OTHER): Payer: Self-pay

## 2017-05-05 VITALS — BP 134/60 | HR 60 | Ht 62.0 in | Wt 125.0 lb

## 2017-05-05 DIAGNOSIS — R194 Change in bowel habit: Secondary | ICD-10-CM | POA: Diagnosis not present

## 2017-05-05 DIAGNOSIS — K2289 Other specified disease of esophagus: Secondary | ICD-10-CM

## 2017-05-05 DIAGNOSIS — K228 Other specified diseases of esophagus: Secondary | ICD-10-CM

## 2017-05-05 MED ORDER — PANTOPRAZOLE SODIUM 40 MG PO TBEC: 40.0000 mg | DELAYED_RELEASE_TABLET | Freq: Every day | ORAL | 0 refills | Status: AC

## 2017-05-05 NOTE — Patient Instructions (Signed)
If you are age 78 or older, your body mass index should be between 23-30. Your Body mass index is 22.86 kg/m. If this is out of the aforementioned range listed, please consider follow up with your Primary Care Provider.  If you are age 70 or younger, your body mass index should be between 19-25. Your Body mass index is 22.86 kg/m. If this is out of the aformentioned range listed, please consider follow up with your Primary Care Provider.   Please discontinue using Carafate.  Please decrease your Protonix to once a day.  Continue using a daily fiber supplement.  Use imodium as needed.  Thank you.

## 2017-05-05 NOTE — Progress Notes (Signed)
HPI :  76 y/o female with a history of DM, GERD, CVA, here for a follow up visit.   I met Katie Lozano initially when she presented to the hospital in January 2018 with DKA, shock, AKI, NSTEMI, and developed an upper GI bleed with melena during her course. 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 She also had erosive gastritis. I suspected she had AEN in the setting of her DKA/shock, due to ischemic injury. She was kept NPO and then diet advanced slowly while PPI was continued.  We discussed performing an EGD upon discharge to assess for mucosal healing and assess for stricture formation.   Unfortunately she was readmitted in March with altered mental status. It was thought she had underlying dementia and concern as well for subacute cortical infarct, although Neurology was following and not convinced she had a stroke. EEG showed mild encephalopathy.  She recovered from his acute events and is living in a nursing home at this time. She denies any dysphagia or odynophagia, no trouble with eating. She denies any nausea or vomiting. She denies any heartburn or reflux changes. She is taking Protonix 40 mg once daily and had been taking Carafate tablets every 6 hours routinely since her hospitalization. She states at baseline she normally has 1-2 bowel movements per day of normal form. About every 2 weeks she may have loose stools on a day in which she eats greasy foods which usually resolves with Kaopectate. She is on a daily fiber supplement. If she does not eat greasy food she has no problems with her bowels.  Patient is on Plavix for history of CVA. She generally feels well without any significant complaints today.  Colonoscopy 08/2015 - 1 small adenoma, severe diverticulosis  Past Medical History:  Diagnosis Date  . Allergy   . Anemia associated with acute blood loss 06/2016  . Colon polyp     . Diabetes mellitus without complication ([place])   . Diverticulitis   . DKA (diabetic ketoacidoses) ([place]) 07/11/2016  . Esophageal necrosis   . GERD (gastroesophageal reflux disease)   . Hematemesis 07/11/2016  . Hiatal hernia   . Hyperlipidemia   . Melena 06/2016  . Stroke ([place])   . UGIB (upper gastrointestinal bleed) 06/2016  . Ulcerative esophagitis      Past Surgical History:  Procedure Laterality Date  . APPENDECTOMY  1956  . CHOLECYSTECTOMY  1997  . TONSILLECTOMY     age 64   Family History  Problem Relation Age of Onset  . Heart disease Mother   . Melanoma Mother   . Stroke Mother   . Heart disease Father        MI  . Diabetes Father   . Colon cancer Maternal Uncle   . Colon polyps Maternal Uncle    Social History   Tobacco Use  . Smoking status: Former Smoker    Types: Cigarettes    Last attempt to quit: 06/29/2012    Years since quitting: 4.8  . Smokeless tobacco: Never Used  Substance Use Topics  . Alcohol use: No    Alcohol/week: 0.0 oz  . Drug use: No   Current Outpatient Medications  Medication Sig Dispense Refill  . Cholecalciferol (D3 SUPER STRENGTH) 2000 UNITS CAPS Take 2,000 Units by mouth daily.     . clopidogrel (PLAVIX) 75 MG tablet Take 1 tablet (75 mg total) by mouth daily. [place]  tablet 1  . insulin aspart (NOVOLOG) 100 UNIT/ML injection Inject into the skin. Inject as per sliding scale: If 140 - 199 = 4 units If 200 - 250 = 6 units If 251 - 299 = 8 units  If 300 - 349 = 12 units If 350 - 400 = 16 units If above 400 call MD or NP.   Inject subcutaneously before meals for treatment of diabetes    . insulin glargine (LANTUS) 100 UNIT/ML injection Inject 0.35 mLs (35 Units total) into the skin at bedtime. 10 mL 11  . lisinopril (PRINIVIL,ZESTRIL) 10 MG tablet Take 10 mg by mouth daily.     . metFORMIN (GLUCOPHAGE-XR) 500 MG 24 hr tablet Take 500 mg daily with breakfast by mouth.    . Methylcellulose, Laxative, 500 MG TABS Take 1 tablet  daily by mouth.    . metoprolol succinate (TOPROL-XL) 25 MG 24 hr tablet Take 1 tablet (25 mg total) by mouth daily. 30 tablet 1  . Multiple Vitamin (MULTI VITAMIN DAILY) TABS Take 1 tablet by mouth daily.     . pantoprazole (PROTONIX) 40 MG tablet Take 1 tablet (40 mg total) daily by mouth. Switch for any other PPI at similar dose and frequency 60 tablet 0  . potassium chloride SA (K-DUR,KLOR-CON) 20 MEQ tablet Take 1 tablet (20 mEq total) by mouth daily. 1 tablet 0  . simvastatin (ZOCOR) 20 MG tablet Take 20 mg by mouth daily.     . sitaGLIPtin (JANUVIA) 100 MG tablet Take 100 mg daily by mouth.    Marland Kitchen acetaminophen (TYLENOL) 325 MG tablet Take 650 mg by mouth every 4 (four) hours as needed for fever.    . Cholecalciferol (VITAMIN D3) 2000 units capsule Take by mouth.    . ezetimibe (ZETIA) 10 MG tablet Take 5 mg by mouth daily. Take 1/2 tablet to = 5 mg     No current facility-administered medications for this visit.    No Known Allergies   Review of Systems: All systems reviewed and negative except where noted in HPI.   Lab Results  Component Value Date   WBC 6.5 09/18/2016   HGB 13.5 09/18/2016   HCT 40.2 09/18/2016   MCV 91.2 09/18/2016   PLT 232 09/18/2016    Lab Results  Component Value Date   CREATININE 0.62 09/18/2016   BUN 12 09/18/2016   NA 142 09/18/2016   K 3.2 (L) 09/18/2016   CL 107 09/18/2016   CO2 24 09/18/2016    Lab Results  Component Value Date   ALT 16 09/17/2016   AST 24 09/17/2016   ALKPHOS 65 09/17/2016   BILITOT 0.9 09/17/2016     Physical Exam: BP 134/60   Pulse 60   Ht _0  (1.575 m)   Wt 125 lb (56.7 kg)   BMI 22.86 kg/m  Constitutional: Pleasant, female in no acute distress. HEENT: Normocephalic and atraumatic. Conjunctivae are normal. No scleral icterus. Neck supple.  Cardiovascular: Normal rate, regular rhythm.  Pulmonary/chest: Effort normal and breath sounds normal. No wheezing, rales or rhonchi. Abdominal: Soft,  nondistended, nontender. There are no masses palpable. No hepatomegaly. Extremities: no edema Lymphadenopathy: No cervical adenopathy noted. Neurological: Alert and oriented to person place and time. Skin: Skin is warm and dry. No rashes noted. Psychiatric: Normal mood and affect. Behavior is normal.   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 Fortunately she recovered well from this, as this diagnosis can carry high risk for short-term mortality. There is also risk of stricture development following this occurrence, fortunately she has not had any dysphagia at all. We would normally consider an upper endoscopy to ensure interval healing, ensure no stricturing, and screen for W[place]ill's esophagus, however given her comorbidities and recent stroke, she did not want to proceed with any invasive testing at this time which I think is reasonable. I will stop her Carafate, and I think okay to reduce her pantoprazole to 20 mg once per day. If she notes worsening heartburn on this regimen she can increase back to 40 mg once per day. If she develops any swallowing difficulty moving forward asked her to contact us for reassessment. She agreed with the plan.  Change in bowel habits - as above, diarrhea is rare and likely dietary related. She will try to avoid greasy foods and can use Imodium as needed, she'll continue daily fiber supplementation.  Stoddard Cellar, MD [facility] Pager [phone]

## 2018-05-15 ENCOUNTER — Observation Stay (HOSPITAL_COMMUNITY)
Admission: EM | Admit: 2018-05-15 | Discharge: 2018-05-16 | Disposition: A | Discharge status: Home / Self Care | Payer: Medicare (Managed Care) | Attending: Emergency Medicine | Admitting: Emergency Medicine

## 2018-05-15 ENCOUNTER — Emergency Department (HOSPITAL_COMMUNITY): Payer: Medicare (Managed Care)

## 2018-05-15 ENCOUNTER — Encounter (HOSPITAL_COMMUNITY): Payer: Self-pay

## 2018-05-15 ENCOUNTER — Other Ambulatory Visit: Payer: Self-pay

## 2018-05-15 DIAGNOSIS — E785 Hyperlipidemia, unspecified: Secondary | ICD-10-CM | POA: Insufficient documentation

## 2018-05-15 DIAGNOSIS — Z87891 Personal history of nicotine dependence: Secondary | ICD-10-CM | POA: Diagnosis not present

## 2018-05-15 DIAGNOSIS — N183 Chronic kidney disease, stage 3 (moderate): Secondary | ICD-10-CM | POA: Diagnosis not present

## 2018-05-15 DIAGNOSIS — Z79899 Other long term (current) drug therapy: Secondary | ICD-10-CM | POA: Insufficient documentation

## 2018-05-15 DIAGNOSIS — Z794 Long term (current) use of insulin: Secondary | ICD-10-CM | POA: Diagnosis not present

## 2018-05-15 DIAGNOSIS — Z7902 Long term (current) use of antithrombotics/antiplatelets: Secondary | ICD-10-CM | POA: Diagnosis not present

## 2018-05-15 DIAGNOSIS — R55 Syncope and collapse: Secondary | ICD-10-CM

## 2018-05-15 DIAGNOSIS — R569 Unspecified convulsions: Secondary | ICD-10-CM

## 2018-05-15 DIAGNOSIS — R410 Disorientation, unspecified: Secondary | ICD-10-CM

## 2018-05-15 DIAGNOSIS — G9340 Encephalopathy, unspecified: Secondary | ICD-10-CM | POA: Diagnosis not present

## 2018-05-15 DIAGNOSIS — E1122 Type 2 diabetes mellitus with diabetic chronic kidney disease: Secondary | ICD-10-CM | POA: Diagnosis not present

## 2018-05-15 DIAGNOSIS — I129 Hypertensive chronic kidney disease with stage 1 through stage 4 chronic kidney disease, or unspecified chronic kidney disease: Secondary | ICD-10-CM | POA: Insufficient documentation

## 2018-05-15 DIAGNOSIS — F0390 Unspecified dementia without behavioral disturbance: Secondary | ICD-10-CM | POA: Diagnosis not present

## 2018-05-15 DIAGNOSIS — I252 Old myocardial infarction: Secondary | ICD-10-CM | POA: Insufficient documentation

## 2018-05-15 DIAGNOSIS — E119 Type 2 diabetes mellitus without complications: Secondary | ICD-10-CM

## 2018-05-15 DIAGNOSIS — Z8673 Personal history of transient ischemic attack (TIA), and cerebral infarction without residual deficits: Secondary | ICD-10-CM | POA: Insufficient documentation

## 2018-05-15 DIAGNOSIS — E1159 Type 2 diabetes mellitus with other circulatory complications: Secondary | ICD-10-CM | POA: Diagnosis not present

## 2018-05-15 LAB — I-STAT CHEM 8, ED
BUN: 18 mg/dL (ref 8–23)
Calcium, Ion: 1.17 mmol/L (ref 1.15–1.40)
Chloride: 107 mmol/L (ref 98–111)
Creatinine, Ser: 1.1 mg/dL — ABNORMAL HIGH (ref 0.44–1.00)
Glucose, Bld: 198 mg/dL — ABNORMAL HIGH (ref 70–99)
HCT: 37 % (ref 36.0–46.0)
HEMOGLOBIN: 12.6 g/dL (ref 12.0–15.0)
POTASSIUM: 3.7 mmol/L (ref 3.5–5.1)
SODIUM: 140 mmol/L (ref 135–145)
TCO2: 23 mmol/L (ref 22–32)

## 2018-05-15 LAB — COMPREHENSIVE METABOLIC PANEL
ALBUMIN: 3.6 g/dL (ref 3.5–5.0)
ALK PHOS: 76 U/L (ref 38–126)
ALT: 12 U/L (ref 0–44)
ANION GAP: 8 (ref 5–15)
AST: 20 U/L (ref 15–41)
BUN: 15 mg/dL (ref 8–23)
CO2: 22 mmol/L (ref 22–32)
Calcium: 9.2 mg/dL (ref 8.9–10.3)
Chloride: 109 mmol/L (ref 98–111)
Creatinine, Ser: 1.21 mg/dL — ABNORMAL HIGH (ref 0.44–1.00)
GFR calc Af Amer: 48 mL/min — ABNORMAL LOW (ref >60)
GFR calc non Af Amer: 42 mL/min — ABNORMAL LOW (ref >60)
Glucose, Bld: 207 mg/dL — ABNORMAL HIGH (ref 70–99)
POTASSIUM: 3.7 mmol/L (ref 3.5–5.1)
SODIUM: 139 mmol/L (ref 135–145)
Total Bilirubin: 0.9 mg/dL (ref 0.3–1.2)
Total Protein: 7 g/dL (ref 6.5–8.1)

## 2018-05-15 LAB — URINALYSIS, ROUTINE W REFLEX MICROSCOPIC
Bilirubin Urine: NEGATIVE
Glucose, UA: 50 mg/dL — AB
Hgb urine dipstick: NEGATIVE
KETONES UR: NEGATIVE mg/dL
LEUKOCYTES UA: NEGATIVE
NITRITE: NEGATIVE
PROTEIN: NEGATIVE mg/dL
Specific Gravity, Urine: 1.021 (ref 1.005–1.030)
pH: 5 (ref 5.0–8.0)

## 2018-05-15 LAB — RAPID URINE DRUG SCREEN, HOSP PERFORMED
Amphetamines: NOT DETECTED
BARBITURATES: NOT DETECTED
Benzodiazepines: NOT DETECTED
COCAINE: NOT DETECTED
OPIATES: NOT DETECTED
TETRAHYDROCANNABINOL: NOT DETECTED

## 2018-05-15 LAB — CBC
HCT: 39.1 % (ref 36.0–46.0)
HEMOGLOBIN: 13.2 g/dL (ref 12.0–15.0)
MCH: 32 pg (ref 26.0–34.0)
MCHC: 33.8 g/dL (ref 30.0–36.0)
MCV: 94.9 fL (ref 80.0–100.0)
NRBC: 0 % (ref 0.0–0.2)
Platelets: 205 10*3/uL (ref 150–400)
RBC: 4.12 MIL/uL (ref 3.87–5.11)
RDW: 12.6 % (ref 11.5–15.5)
WBC: 8.7 10*3/uL (ref 4.0–10.5)

## 2018-05-15 LAB — CBG MONITORING, ED: Glucose-Capillary: 201 mg/dL — ABNORMAL HIGH (ref 70–99)

## 2018-05-15 LAB — DIFFERENTIAL
ABS IMMATURE GRANULOCYTES: 0.04 10*3/uL (ref 0.00–0.07)
BASOS ABS: 0 10*3/uL (ref 0.0–0.1)
Basophils Relative: 1 %
EOS PCT: 2 %
Eosinophils Absolute: 0.2 10*3/uL (ref 0.0–0.5)
IMMATURE GRANULOCYTES: 1 %
Lymphocytes Relative: 30 %
Lymphs Abs: 2.6 10*3/uL (ref 0.7–4.0)
Monocytes Absolute: 1 10*3/uL (ref 0.1–1.0)
Monocytes Relative: 12 %
Neutro Abs: 4.8 10*3/uL (ref 1.7–7.7)
Neutrophils Relative %: 54 %

## 2018-05-15 LAB — AMMONIA: AMMONIA: 18 umol/L (ref 9–35)

## 2018-05-15 LAB — GLUCOSE, CAPILLARY: GLUCOSE-CAPILLARY: 86 mg/dL (ref 70–99)

## 2018-05-15 LAB — PROTIME-INR
INR: 1.06
Prothrombin Time: 13.7 seconds (ref 11.4–15.2)

## 2018-05-15 LAB — APTT: aPTT: 25 seconds (ref 24–36)

## 2018-05-15 LAB — I-STAT TROPONIN, ED: Troponin i, poc: 0 ng/mL (ref 0.00–0.08)

## 2018-05-15 LAB — MRSA PCR SCREENING: MRSA by PCR: NEGATIVE

## 2018-05-15 MED ORDER — ACETAMINOPHEN 650 MG RE SUPP: 650.0000 mg | Freq: Four times a day (QID) | RECTAL | Status: DC | PRN

## 2018-05-15 MED ORDER — PANTOPRAZOLE SODIUM 20 MG PO TBEC
20.0000 mg | DELAYED_RELEASE_TABLET | Freq: Every day | ORAL | Status: DC
  Administered 2018-05-16: 20 mg via ORAL
  Filled 2018-05-15: qty 1

## 2018-05-15 MED ORDER — LEVETIRACETAM 250 MG PO TABS
250.0000 mg | ORAL_TABLET | Freq: Two times a day (BID) | ORAL | Status: DC
  Administered 2018-05-15 – 2018-05-16 (×2): 250 mg via ORAL
  Filled 2018-05-15 (×2): qty 1

## 2018-05-15 MED ORDER — SIMVASTATIN 20 MG PO TABS
20.0000 mg | ORAL_TABLET | Freq: Every day | ORAL | Status: DC
  Administered 2018-05-15: 20 mg via ORAL
  Filled 2018-05-15: qty 4

## 2018-05-15 MED ORDER — METOPROLOL SUCCINATE ER 25 MG PO TB24
25.0000 mg | ORAL_TABLET | Freq: Every day | ORAL | Status: DC
  Administered 2018-05-16: 25 mg via ORAL
  Filled 2018-05-15: qty 1

## 2018-05-15 MED ORDER — CLOPIDOGREL BISULFATE 75 MG PO TABS
75.0000 mg | ORAL_TABLET | Freq: Every day | ORAL | Status: DC
  Administered 2018-05-16: 75 mg via ORAL
  Filled 2018-05-15: qty 1

## 2018-05-15 MED ORDER — ACETAMINOPHEN 325 MG PO TABS: 650.0000 mg | ORAL_TABLET | Freq: Four times a day (QID) | ORAL | Status: DC | PRN

## 2018-05-15 MED ORDER — SODIUM CHLORIDE 0.9% FLUSH
3.0000 mL | Freq: Two times a day (BID) | INTRAVENOUS | Status: DC
  Administered 2018-05-15: 3 mL via INTRAVENOUS

## 2018-05-15 MED ORDER — SODIUM CHLORIDE 0.9 % IV SOLN: 250.0000 mL | INTRAVENOUS | Status: DC | PRN

## 2018-05-15 MED ORDER — SODIUM CHLORIDE 0.9% FLUSH: 3.0000 mL | INTRAVENOUS | Status: DC | PRN

## 2018-05-15 MED ORDER — SODIUM CHLORIDE 0.9% FLUSH
3.0000 mL | Freq: Two times a day (BID) | INTRAVENOUS | Status: DC
  Administered 2018-05-15 – 2018-05-16 (×3): 3 mL via INTRAVENOUS

## 2018-05-15 MED ORDER — INSULIN GLARGINE 100 UNIT/ML ~~LOC~~ SOLN
35.0000 [IU] | Freq: Every day | SUBCUTANEOUS | Status: DC
  Administered 2018-05-16: 35 [IU] via SUBCUTANEOUS
  Filled 2018-05-15: qty 0.35

## 2018-05-15 NOTE — ED Notes (Signed)
Cy Blamer - sister - [phone] - [place] - [phone].

## 2018-05-15 NOTE — Progress Notes (Signed)
Received the patient from ED, alert but some confusion, placed her on Tele and verified, call light in reach, oriented to room, seizure precaution in place, monitoring continues

## 2018-05-15 NOTE — ED Notes (Addendum)
Pt mildly confused from baseline per sister at this time, speaking with sister while waiting for MRI.

## 2018-05-15 NOTE — ED Provider Notes (Signed)
MSE was initiated and I personally evaluated the patient and placed orders (if any) at  4:02 PM on May 15, 2018.  The patient appears stable so that the remainder of the MSE may be completed by another provider.  Patient seen at the bridge as a code stroke.  Her airway is patent and clear.  Patient will go to CT scan emergently and is safe for further work-up with the neurology team.   Margarita Mail, PA-C 05/15/18 Helper, Julie, MD 05/16/18 [phone]

## 2018-05-15 NOTE — ED Notes (Signed)
Pt in MRI at this time 

## 2018-05-15 NOTE — Code Documentation (Signed)
Patient lives in a skilled nursing facility, history of dementia.  She was out "shopping " with her sister when she had a blank stare and wasn't talking.  EMS called Code Stroke enroute.  Upon arrival patient very difficult to assess, not cooperating with staff.  Stat labs and head CT done.  Dr Aroor at bedside to assess patient. Stat MRI done.  When sister at bedside patient much more talkative and able to follow some commands.

## 2018-05-15 NOTE — ED Notes (Signed)
Sister has arrived to bedside.

## 2018-05-15 NOTE — ED Provider Notes (Signed)
[facility] EMERGENCY DEPARTMENT Provider Note   CSN: [id number] Arrival date & time: 05/15/18  1343   An emergency department physician performed an initial assessment on this suspected stroke patient at 1340.  History   Chief Complaint Chief Complaint  Patient presents with  . Code Stroke    HPI Katie Lozano is a 77 y.o. female who presents today for evaluation as a code stroke.  She resides at a nursing facility.  She was out shopping with her sister today standing at the register at about 1245 when she suddenly had a blank stare, difficulty standing, and weakness.  According to her sister who was with her she had full loss of tone for a few seconds.  She was lowered to the floor, did not fall. Patient sister says that normally she is able to carry on a conversation, and that just the other day the had coffee and a normal conversation.  According to handoff/chart review upon arrival patient was having difficulties with word finding, unable to name what a pen and glasses were, and incorrectly answering questions.    Patient goes on elaborate tangents about how 4 days ago she slipped on 3 cushions, was laying on the floor next to her bed that her sister was in and her sister was breast-feeding a baby doll however this was not actually her sister and at one point the doll flew onto another person.  Patient's sister confirms that this did not happen.   She didn't have any facial droop or weakness according to notes.    HPI  Past Medical History:  Diagnosis Date  . Allergy   . Anemia associated with acute blood loss 06/2016  . Colon polyp   . Diabetes mellitus without complication ([place])   . Diverticulitis   . DKA (diabetic ketoacidoses) ([place]) 07/11/2016  . Esophageal necrosis   . GERD (gastroesophageal reflux disease)   . Hematemesis 07/11/2016  . Hiatal hernia   . Hyperlipidemia   . Melena 06/2016  . Stroke ([place])   . UGIB (upper gastrointestinal bleed)  06/2016  . Ulcerative esophagitis     Patient Active Problem List   Diagnosis Date Noted  . Altered mental status   . Cerebral embolism with cerebral infarction 09/16/2016  . Protein-calorie malnutrition, severe 09/15/2016  . Acute encephalopathy 09/14/2016  . Dementia without behavioral disturbance ([place]) 09/14/2016  . 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 Encephalopathy 09/02/2016  . Diabetes mellitus ([place]) 09/02/2016  . Anemia 09/02/2016  . 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 NSTEMI (non-ST elevated myocardial infarction) ([place]) 07/22/2016  . Pressure injury of skin 07/17/2016  . Encounter for central line placement   . Metabolic acidosis   . AKI (acute kidney injury) ([place])   . UGIB (upper gastrointestinal bleed)   . Melena   . Acute blood loss anemia   . Diabetic ketoacidosis without coma associated with type 2 diabetes mellitus ([place]) 07/11/2016  . Hypovolemic shock ([place])   . Essential hypertension 06/06/2016  . Hypokalemia 06/06/2016  . CKD (chronic kidney disease), stage III ([place]) 06/06/2016  . Carotid artery syndrome 06/05/2016  . Type 2 diabetes mellitus with diabetic nephropathy, with long-term current use of insulin ([place]) 03/30/2015  . History of colonic polyps 03/30/2015  . Hyperlipidemia 03/30/2015    Past Surgical History:  Procedure Laterality Date  . APPENDECTOMY  1956  . CHOLECYSTECTOMY  1997  . ESOPHAGOGASTRODUODENOSCOPY N/A 07/14/2016   Procedure: ESOPHAGOGASTRODUODENOSCOPY (EGD);  Surgeon: Manus Gunning, MD;  Location:  [facility];  Service: Gastroenterology;  Laterality: N/A;  at bedside  . TONSILLECTOMY     age 28     OB History   None      Home Medications    Prior to Admission medications   Medication Sig Start Date End Date Taking? Authorizing Provider  acetaminophen (TYLENOL) 325 MG tablet Take 650 mg by mouth every 4 (four) hours as needed for fever.   Yes [provider]  chlorhexidine (PERIDEX) 0.12 %  solution Use as directed 15 mLs in the mouth or throat every 12 (twelve) hours as needed (gingivitis).   Yes [provider]  cholecalciferol (VITAMIN D3) 25 MCG (1000 UT) tablet Take 2,000 Units by mouth at bedtime.   Yes [provider]  clopidogrel (PLAVIX) 75 MG tablet Take 1 tablet (75 mg total) by mouth daily. 09/19/16  Yes Reyne Dumas, MD  Emollient (CERAVE) CREA Apply 1 application topically See admin instructions. Apply topically to face/feet twice daily for dry skin   Yes [provider]  insulin aspart (NOVOLOG) 100 UNIT/ML injection Inject 0-10 Units into the skin See admin instructions. Inject 0-10 units subcutaneously before meals and at bedtime per sliding scale: CBG 0-250 0 units, 251-350 4 units, 351-450 6 units, 451-550 8 units, 551-600 10 units and call provider 07/22/16  Yes [provider]  insulin glargine (LANTUS) 100 UNIT/ML injection Inject 0.35 mLs (35 Units total) into the skin at bedtime. Patient taking differently: Inject 43 Units into the skin daily.  09/18/16  Yes Reyne Dumas, MD  loperamide (IMODIUM A-D) 2 MG tablet Take 2-4 mg by mouth See admin instructions. Take 2 tablets (4 mg) by mouth for initial dose, then take 1 tablet (2 mg) every 6 hours - as needed for diarrhea.   Yes [provider]  Methylcellulose, Laxative, 500 MG TABS Take 500 mg by mouth daily.    Yes [provider]  metoprolol succinate (TOPROL-XL) 25 MG 24 hr tablet Take 1 tablet (25 mg total) by mouth daily. 09/19/16  Yes Reyne Dumas, MD  Multiple Vitamin (MULTIVITAMIN WITH MINERALS) TABS tablet Take 1 tablet by mouth at bedtime.   Yes [provider]  pantoprazole (PROTONIX) 20 MG tablet Take 20 mg by mouth daily.   Yes [provider]  simvastatin (ZOCOR) 20 MG tablet Take 20 mg by mouth at bedtime.  02/24/15  Yes [provider]  sitaGLIPtin (JANUVIA) 100 MG tablet Take 100 mg daily by mouth.   Yes [provider]  pantoprazole (PROTONIX) 40 MG tablet Take 1 tablet (40 mg total) daily by mouth. Switch for any other PPI at similar dose and frequency Patient not taking: Reported on 05/15/2018 05/05/17   Yetta Flock, MD  potassium chloride SA (K-DUR,KLOR-CON) 20 MEQ tablet Take 1 tablet (20 mEq total) by mouth daily. Patient not taking: Reported on 05/15/2018 09/18/16   Reyne Dumas, MD    Family History Family History  Problem Relation Age of Onset  . Heart disease Mother   . Melanoma Mother   . Stroke Mother   . Heart disease Father        MI  . Diabetes Father   . Colon cancer Maternal Uncle   . Colon polyps Maternal Uncle     Social History Social History   Tobacco Use  . Smoking status: Former Smoker    Types: Cigarettes    Last attempt to quit: 06/29/2012    Years since quitting: 5.8  . Smokeless  tobacco: Never Used  Substance Use Topics  . Alcohol use: No    Alcohol/week: 0.0 standard drinks  . Drug use: No     Allergies   Trulicity [dulaglutide]   Review of Systems Review of Systems  Unable to perform ROS: Mental status change     Physical Exam Updated Vital Signs BP (!) 128/59   Pulse 66   Temp 97.9 F (36.6 C) (Rectal)   Resp 13   Ht 5\' 2"  (1.575 m)   Wt 62.7 kg   SpO2 98%   BMI 25.28 kg/m   Physical Exam  Constitutional: She appears well-developed and well-nourished. No distress.  HENT:  Head: Normocephalic and atraumatic.  Mouth/Throat: Oropharynx is clear and moist.  Eyes: Pupils are equal, round, and reactive to light. Conjunctivae and EOM are normal. No scleral icterus.  Neck: Normal range of motion. Neck supple.  Cardiovascular: Normal rate, regular rhythm, normal heart sounds and intact distal pulses.  No murmur heard. Pulmonary/Chest: Effort normal and breath sounds normal. No respiratory distress. She has no wheezes. She exhibits no tenderness.  Abdominal: Soft. Bowel sounds are normal. She exhibits no distension. There  is no tenderness. There is no guarding.  Musculoskeletal: She exhibits no edema.  Neurological: She is alert.  Oriented to person, able to tell me who her sister is.  Thinks its October, is unsure of year.  She does not know where she is.  She is able to name simple objects (glasses, pen) on my exam.  She expresses elaborate delusions, going on long stories about events that didn't happen according to her sister.  Able to elevate eyebrows.  5/5 strength in bilateral upper and lower extremities.    Skin: Skin is warm and dry. She is not diaphoretic.  Psychiatric: She has a normal mood and affect.  Nursing note and vitals reviewed.    ED Treatments / Results  Labs (all labs ordered are listed, but only abnormal results are displayed) Labs Reviewed  COMPREHENSIVE METABOLIC PANEL - Abnormal; Notable for the following components:      Result Value   Glucose, Bld 207 (*)    Creatinine, Ser 1.21 (*)    GFR calc non Af Amer 42 (*)    GFR calc Af Amer 48 (*)    All other components within normal limits  URINALYSIS, ROUTINE W REFLEX MICROSCOPIC - Abnormal; Notable for the following components:   Glucose, UA 50 (*)    All other components within normal limits  CBG MONITORING, ED - Abnormal; Notable for the following components:   Glucose-Capillary 201 (*)    All other components within normal limits  I-STAT CHEM 8, ED - Abnormal; Notable for the following components:   Creatinine, Ser 1.10 (*)    Glucose, Bld 198 (*)    All other components within normal limits  URINE CULTURE  PROTIME-INR  APTT  CBC  DIFFERENTIAL  AMMONIA  RAPID URINE DRUG SCREEN, HOSP PERFORMED  I-STAT TROPONIN, ED    EKG EKG Interpretation  Date/Time:  Saturday May 15 2018 15:11:28 EST Ventricular Rate:  68 PR Interval:    QRS Duration: 129 QT Interval:  452 QTC Calculation: 481 R Axis:   16 Text Interpretation:  Sinus rhythm Left bundle branch block No significant change since last tracing Confirmed by  Isla Pence [phone]) on 05/15/2018 3:16:52 PM   Radiology Mr Brain Wo Contrast  Result Date: 05/15/2018 CLINICAL DATA:  77 year old female code stroke presentation, weakness and aphasia. Limited study by neurology  request. EXAM: LIMITED MRI HEAD WITHOUT CONTRAST TECHNIQUE: Multiplanar, multiecho pulse sequences of the brain and surrounding structures were obtained without intravenous contrast. COMPARISON:  Head CT without contrast 1355 hours today and earlier including brain MRI 09/14/2016. FINDINGS: Diffusion-weighted, T2, FLAIR, and susceptibility weighted images only. Brain: No restricted diffusion or evidence of acute infarction. Right superior parietal lobe cortical and subcortical white matter encephalomalacia as seen on CT today (series 8, image 20). Small new T2 hyperintense focus in the right caudate is chronic also. Scattered and patchy cerebral white matter T2 and FLAIR hyperintensity elsewhere has not significantly changed. No midline shift, mass effect, ventriculomegaly, extra-axial collection. Vascular: Major intracranial vascular flow voids are stable since 2018. Sinuses/Orbits: Stable and negative. IMPRESSION: 1. Negative for acute infarct. 2. Chronic ischemic disease in the right parietal lobe and caudate, increased since 2018 and corresponding to the CT findings today. Electronically Signed   By: Genevie Ann M.D.   On: 05/15/2018 15:29   Ct Head Code Stroke Wo Contrast  Result Date: 05/15/2018 CLINICAL DATA:  Code stroke. 77 year old female with weakness and aphasia. EXAM: CT HEAD WITHOUT CONTRAST TECHNIQUE: Contiguous axial images were obtained from the base of the skull through the vertex without intravenous contrast. COMPARISON:  CT and CTA head 09/16/2016. Brain MRI 09/14/2016. FINDINGS: Brain: A small area of cortical and subcortical white matter encephalomalacia in the right superior parietal lobe has progressed since 2018 but appears chronic. New but chronic appearing small  lacunar infarct of the right caudate nucleus on series 3, image 14. Elsewhere stable gray-white matter differentiation throughout the brain and stable cerebral volume. No midline shift, ventriculomegaly, mass effect, evidence of mass lesion, intracranial hemorrhage or evidence of cortically based acute infarction. Vascular: Calcified atherosclerosis at the skull base. No suspicious intracranial vascular hyperdensity. Skull: Advanced left TMJ degeneration. No acute osseous abnormality identified. Sinuses/Orbits: Visualized paranasal sinuses and mastoids are stable and well pneumatized. Other: Visualized orbits and scalp soft tissues are within normal limits. ASPECTS [facility] Stroke Program Early CT Score) - Ganglionic level infarction (caudate, lentiform nuclei, internal capsule, insula, M1-M3 cortex): 7 - Supraganglionic infarction (M4-M6 cortex): 3 Total score (0-10 with 10 being normal): 10 IMPRESSION: 1. Increased since 2018 but chronic appearing ischemic disease in the right caudate and superior right parietal lobe. 2. No acute cortically based infarct or acute intracranial hemorrhage identified. ASPECTS is 10. 3. These results were communicated to Dr. Lorraine Lax at 2:06 pmon 11/16/2019by text page via the [facility] messaging system. Electronically Signed   By: Genevie Ann M.D.   On: 05/15/2018 14:07    Procedures Procedures (including critical care time)  Medications Ordered in ED Medications - No data to display   Initial Impression / Assessment and Plan / ED Course  I have reviewed the triage vital signs and the nursing notes.  Pertinent labs & imaging results that were available during my care of the patient were reviewed by me and considered in my medical decision making (see chart for details).  Clinical Course as of May 15 1702  Sat May 15, 2018  1700 Spoke with hospitalist who will admit patient.    [EH]    Clinical Course User Index [EH] Lorin Glass, PA-C   Patient presents today for  evaluation as a code stroke.  She had an episode where she was out shopping with her sister where she had a full loss of tone and became unresponsive for a few seconds, after which she was confused.  Neuro had ordered CT  head code stroke which did not show evidence of a stroke, and MRI which did not show evidence of a new infarct.  She was not given TPA.  Upon my evaluation she has improved from what her reported exam was on arrival, however remains strongly fixed on bizarre delusions, which were not there before this event according to sister.     Labs appear consistent with her baseline.  Ammonia is not elevated, urine does not have evidence of urinary tract infection, no obvious cause for her delusional state found.  This patient was seen as a shared visit with Dr. Laverta Baltimore.  I spoke with the hospitalist who agreed to admit patient.   Final Clinical Impressions(s) / ED Diagnoses   Final diagnoses:  Confusion  Syncope, unspecified syncope type    ED Discharge Orders    None       Lorin Glass, Hershal Coria 05/15/18 1709    Margette Fast, MD 05/16/18 [phone]

## 2018-05-15 NOTE — ED Triage Notes (Signed)
Pt arrived via [facility] after going out shopping with family today. Pt was with sister who reported a sudden onset of "staring off and confusion" at 1245 today. Per sister, pt has history of CVA and dementia. EMS reports worsening aphasia at 1325.

## 2018-05-15 NOTE — Consult Note (Addendum)
NEURO HOSPITALIST  CONSULT   Requesting Physician: Dr. Gilford Raid    Chief Complaint: slurred speech/ weakness  History obtained from:  EMS/chart HPI:                                                                                                                                         Katie Lozano is an 77 y.o. female PMH stroke, HLD, DM , dementia w/o behavioral disturbance who presented to [facility] ED as a code stroke with c/o slurred speech, weakness.   Patient was out shopping with her sister. Standing at the register about 1245 when she had a sudden onset of a blank stare, difficulty speaking and weakness. When EMS arrived she was awake, alert and oriented with no deficits. While on the way to the ED she had an acute onset of garbled speech that started at 1325. Family unaware what medications she is on . Per chart not on any anticoagulation. Has had a prior stroke 01/2017. Patient is uncooperative with exam.   ED course:  BP: 120/54 BG:295 CTH: no hemorrhage Creatinine: 1.10   Chart review of past stroke:  08/2016: per MRI right subcortical infarct/ Neurology disagreed ( no residual deficits 12/17: TIA  Date last known well: Date: 05/15/2018 Time last known well: Time: 12:45 tPA Given: No: symptoms inconsistent Modified Rankin: Rankin Score=2 NIHSS:3   Past Medical History:  Diagnosis Date  . Allergy   . Anemia associated with acute blood loss 06/2016  . Colon polyp   . Diabetes mellitus without complication ([place])   . Diverticulitis   . DKA (diabetic ketoacidoses) ([place]) 07/11/2016  . Esophageal necrosis   . GERD (gastroesophageal reflux disease)   . Hematemesis 07/11/2016  . Hiatal hernia   . Hyperlipidemia   . Melena 06/2016  . Stroke ([place])   . UGIB (upper gastrointestinal bleed) 06/2016  . Ulcerative esophagitis     Past Surgical History:  Procedure Laterality Date  . APPENDECTOMY  1956  . CHOLECYSTECTOMY  1997  .  ESOPHAGOGASTRODUODENOSCOPY N/A 07/14/2016   Procedure: ESOPHAGOGASTRODUODENOSCOPY (EGD);  Surgeon: Manus Gunning, MD;  Location: [facility];  Service: Gastroenterology;  Laterality: N/A;  at bedside  . TONSILLECTOMY     age 45    Family History  Problem Relation Age of Onset  . Heart disease Mother   . Melanoma Mother   . Stroke Mother   . Heart disease Father        MI  . Diabetes Father   . Colon cancer Maternal Uncle   . Colon polyps Maternal Uncle        Social History:  reports  that she quit smoking about 5 years ago. Her smoking use included cigarettes. She has never used smokeless tobacco. She reports that she does not drink alcohol or use drugs.  Allergies: No Known Allergies  Medications:                                                                                                                           No current facility-administered medications for this encounter.    Current Outpatient Medications  Medication Sig Dispense Refill  . acetaminophen (TYLENOL) 325 MG tablet Take 650 mg by mouth every 4 (four) hours as needed for fever.    . Cholecalciferol (D3 SUPER STRENGTH) 2000 UNITS CAPS Take 2,000 Units by mouth daily.     . Cholecalciferol (VITAMIN D3) 2000 units capsule Take by mouth.    . clopidogrel (PLAVIX) 75 MG tablet Take 1 tablet (75 mg total) by mouth daily. 30 tablet 1  . ezetimibe (ZETIA) 10 MG tablet Take 5 mg by mouth daily. Take 1/2 tablet to = 5 mg    . insulin aspart (NOVOLOG) 100 UNIT/ML injection Inject into the skin. Inject as per sliding scale: If 140 - 199 = 4 units If 200 - 250 = 6 units If 251 - 299 = 8 units  If 300 - 349 = 12 units If 350 - 400 = 16 units If above 400 call MD or NP.   Inject subcutaneously before meals for treatment of diabetes    . insulin glargine (LANTUS) 100 UNIT/ML injection Inject 0.35 mLs (35 Units total) into the skin at bedtime. 10 mL 11  . lisinopril (PRINIVIL,ZESTRIL) 10 MG tablet Take 10 mg by  mouth daily.     . metFORMIN (GLUCOPHAGE-XR) 500 MG 24 hr tablet Take 500 mg daily with breakfast by mouth.    . Methylcellulose, Laxative, 500 MG TABS Take 1 tablet daily by mouth.    . metoprolol succinate (TOPROL-XL) 25 MG 24 hr tablet Take 1 tablet (25 mg total) by mouth daily. 30 tablet 1  . Multiple Vitamin (MULTI VITAMIN DAILY) TABS Take 1 tablet by mouth daily.     . pantoprazole (PROTONIX) 40 MG tablet Take 1 tablet (40 mg total) daily by mouth. Switch for any other PPI at similar dose and frequency 60 tablet 0  . potassium chloride SA (K-DUR,KLOR-CON) 20 MEQ tablet Take 1 tablet (20 mEq total) by mouth daily. 1 tablet 0  . simvastatin (ZOCOR) 20 MG tablet Take 20 mg by mouth daily.     . sitaGLIPtin (JANUVIA) 100 MG tablet Take 100 mg daily by mouth.       ROS:  unobtainable from patient due to mental status  General Examination:                                                                                                      There were no vitals taken for this visit.  HEENT-  Normocephalic, no lesions, without obvious abnormality.  Normal external eye and conjunctiva.  Cardiovascular- S1-S2 audible, pulses palpable throughout   Lungs-no rhonchi or wheezing noted, no excessive working breathing.  Saturations within normal limits Abdomen- All 4 quadrants palpated and nontender Extremities- Warm, dry and intact Musculoskeletal-no joint tenderness, deformity or swelling Skin-warm and dry, no hyperpigmentation, vitiligo, or suspicious lesions  Neurological Examination Mental Status: Alert, confusion present. Uncooperative with exam.  Speech: Speaks in small phrases not answering question appropriately.  No dysarthria noted.   Able to follow some commands without difficulty. Cranial Nerves: II:  Visual fields grossly normal,  III,IV, VI: ptosis  not present, extra-ocular motions intact bilaterally, pupils equal, round, reactive to light and accommodation V,VII: smile symmetric, facial light touch sensation normal bilaterally VIII: hearing normal bilaterally IX,X: uvula rises symmetrically XI: bilateral shoulder shrug XII: midline tongue extension Motor/Sensory: able to localize and withdraw all 4 extremities to painful stimuli with good strength.  Cerebellar: No gross ataxia noted. Plantars: downgoing  Gait: deferred   Lab Results: Basic Metabolic Panel: Recent Labs  Lab 05/15/18 1355  NA 140  K 3.7  CL 107  GLUCOSE 198*  BUN 18  CREATININE 1.10*    CBC: Recent Labs  Lab 05/15/18 1352 05/15/18 1355  WBC 8.7  --   NEUTROABS 4.8  --   HGB 13.2 12.6  HCT 39.1 37.0  MCV 94.9  --   PLT 205  --    CBG: Recent Labs  Lab 05/15/18 1345  GLUCAP 201*    Imaging: Ct Head Code Stroke Wo Contrast  Result Date: 05/15/2018 CLINICAL DATA:  Code stroke. 77 year old female with weakness and aphasia. EXAM: CT HEAD WITHOUT CONTRAST TECHNIQUE: Contiguous axial images were obtained from the base of the skull through the vertex without intravenous contrast. COMPARISON:  CT and CTA head 09/16/2016. Brain MRI 09/14/2016. FINDINGS: Brain: A small area of cortical and subcortical white matter encephalomalacia in the right superior parietal lobe has progressed since 2018 but appears chronic. New but chronic appearing small lacunar infarct of the right caudate nucleus on series 3, image 14. Elsewhere stable gray-white matter differentiation throughout the brain and stable cerebral volume. No midline shift, ventriculomegaly, mass effect, evidence of mass lesion, intracranial hemorrhage or evidence of cortically based acute infarction. Vascular: Calcified atherosclerosis at the skull base. No suspicious intracranial vascular hyperdensity. Skull: Advanced left TMJ degeneration. No acute osseous abnormality identified. Sinuses/Orbits:  Visualized paranasal sinuses and mastoids are stable and well pneumatized. Other: Visualized orbits and scalp soft tissues are within normal limits. ASPECTS [facility] Stroke Program Early CT Score) - Ganglionic level infarction (caudate, lentiform nuclei, internal capsule, insula, M1-M3 cortex): 7 - Supraganglionic infarction (M4-M6 cortex): 3 Total score (0-10 with 10 being normal): 10 IMPRESSION: 1. Increased since 2018 but chronic  appearing ischemic disease in the right caudate and superior right parietal lobe. 2. No acute cortically based infarct or acute intracranial hemorrhage identified. ASPECTS is 10. 3. These results were communicated to Dr. Lorraine Lax at 2:06 pmon 11/16/2019by text page via the [facility] messaging system. Electronically Signed   By: Genevie Ann M.D.   On: 05/15/2018 14:07    Laurey Morale, MSN, NP-C [facility] [phone]  05/15/2018, 1:55 PM   Attending physician note to follow with Assessment and plan .   Assessment: 77 y.o. female PMH stroke, HLD, DM , dementia w/o behavioral disturbance who presented to [facility] ED as a code stroke with c/o slurred speech, weakness. CTH: negative for hemorrhage. Ordered STAT MRI ordered. No TPA  At this time given very inconsistent, effort dependent exam. Frequent neuro monitoring. MRI: no acute infarct. Given staring spell this could have been a seizure. Could also be progression of dementia. Will recommend starting on a low dose of Keppra if family is okay with this. If patient is back to baseline may discharge home from ED.    Acute encephalopathy  Moderate to severe Dementia  -possible complex partial seizure vs delirium     Recommendations: --consider trial of  Low dose Depakote 250 mg BID/Keppra 250mg  BID  and f/u with outpatient Neurology --Telemetry monitoring --Frequent neuro checks -if back to baseline may discharge from the ED.     --please page stroke NP  Or  PA  Or MD from 8am -4 pm  as this patient from this time  will be  followed by the stroke.   You can look them up on [email or web address]  Password [email or web address]    NEUROHOSPITALIST ADDENDUM Performed a face to face diagnostic evaluation.   I have reviewed the contents of history and physical exam as documented by PA/ARNP/Resident and agree with above documentation.  I have discussed and formulated the above plan as documented. Edits to the note have been made as needed.  77 year old female dementia presented as a stroke alert.  Apparently was with her sister shopping and suddenly started staring off and was not answering any questions.  EMS was called, initially felt patient may be having a stroke as she appeared aphasic.  Patient subsequently improved only to worsen again.  On arrival patient appeared confused, was not answering questions appropriately.  No other focal deficits was noted.  Stat CT head was negative.  Stat MRI was obtained as suspicion was stroke was low, however patient did appear to have a subacute infarct in 2018.  Sister arrived and clarified that patient at baseline has advanced dementia and does not talk much normally.  MRI brain was also negative for acute stroke.  I wonder if patient is having seizures.  She is also had episodes in the past with acute confusion.  EEG performed in 2018 was unremarkable, however given her dementia it may be worth considering a trial of low-dose Depakote or Keppra for possible seizures .  Also would check UA, CBC BMP for metabolic/infectious causes for delirium. Patient has to be admitted would consider repeat EEG, however given that it is the weekend may not happen until Monday.    Karena Addison Donata Reddick MD [facility] [phone]   If 7pm to 7am, please call on call as listed on AMION.

## 2018-05-15 NOTE — ED Notes (Signed)
Attempted report x 2 

## 2018-05-15 NOTE — ED Notes (Signed)
Pt arrived to Rm 56 via stretcher. Monitor intact to pt. Admitting MD in w/pt.

## 2018-05-15 NOTE — ED Notes (Signed)
Report given to [place], RN - 3W - requested for pt to arrive at [place].

## 2018-05-15 NOTE — H&P (Signed)
History and Physical  JOLIANA CLAFLIN ZDG:[id number] DOB: Apr 26, 1941 DOA: 05/15/2018  PCP: Bernerd Limbo, MD   Chief Complaint: Fall  HPI:  77 year old woman PMH diabetes mellitus, hyperlipidemia, presented after a fall in the community.  Given reported confusion, a aphasia, there was concern for stroke.  However the exam was nonfocal and MRI was pursued which was negative.  Neurology evaluated the patient, suspected seizure and started Keppra.  Plan for observation overnight.  Patient reports that she went shopping with her sister today, while she was walking her legs became weak and then she fell to the floor.  She does not think she passed out.  She denies any weakness of her particular arm or leg or neurologic symptoms afterwards.  Sister at bedside reports they were shopping when her sister became weak and fell to the ground.  Not clear that she passed out.  She was confused afterwards.  She did not observe difficulty speaking or focal neurologic signs.  Symptoms were transient in nature.    Patient does report that she has had a homemade of some kind staying with her and that this person is "famous".  Other than the strange statement, her history appears to be correct.  ED Course: Monitored in the emergency department and evaluated with MRI and basic screening work-up.  Neurology recommendations below.  Review of Systems:  Negative for fever, visual changes,rash, new muscle aches, chest pain, SOB, dysuria, bleeding, n/v/abdominal pain, numbness.  Positive for sore throat.  Past Medical History:  Diagnosis Date  . Allergy   . Anemia associated with acute blood loss 06/2016  . Colon polyp   . Diabetes mellitus without complication ([place])   . Diverticulitis   . DKA (diabetic ketoacidoses) ([place]) 07/11/2016  . Esophageal necrosis   . GERD (gastroesophageal reflux disease)   . Hematemesis 07/11/2016  . Hiatal hernia   . Hyperlipidemia   . Melena 06/2016  . Stroke ([place])   . UGIB  (upper gastrointestinal bleed) 06/2016  . Ulcerative esophagitis     Past Surgical History:  Procedure Laterality Date  . APPENDECTOMY  1956  . CHOLECYSTECTOMY  1997  . ESOPHAGOGASTRODUODENOSCOPY N/A 07/14/2016   Procedure: ESOPHAGOGASTRODUODENOSCOPY (EGD);  Surgeon: Manus Gunning, MD;  Location: [place];  Service: Gastroenterology;  Laterality: N/A;  at bedside  . TONSILLECTOMY     age 59     reports that she quit smoking about 5 years ago. Her smoking use included cigarettes. She has never used smokeless tobacco. She reports that she does not drink alcohol or use drugs. Mobility: Ambulatory  Allergies  Allergen Reactions  . Trulicity [Dulaglutide] Diarrhea    Family History  Problem Relation Age of Onset  . Heart disease Mother   . Melanoma Mother   . Stroke Mother   . Heart disease Father        MI  . Diabetes Father   . Colon cancer Maternal Uncle   . Colon polyps Maternal Uncle      Prior to Admission medications   Medication Sig Start Date End Date Taking? Authorizing Provider  acetaminophen (TYLENOL) 325 MG tablet Take 650 mg by mouth every 4 (four) hours as needed for fever.   Yes [provider]  chlorhexidine (PERIDEX) 0.12 % solution Use as directed 15 mLs in the mouth or throat every 12 (twelve) hours as needed (gingivitis).   Yes [provider]  cholecalciferol (VITAMIN D3) 25 MCG (1000 UT) tablet Take 2,000 Units by mouth at bedtime.  Yes [provider]  clopidogrel (PLAVIX) 75 MG tablet Take 1 tablet (75 mg total) by mouth daily. 09/19/16  Yes Reyne Dumas, MD  Emollient (CERAVE) CREA Apply 1 application topically See admin instructions. Apply topically to face/feet twice daily for dry skin   Yes [provider]  insulin aspart (NOVOLOG) 100 UNIT/ML injection Inject 0-10 Units into the skin See admin instructions. Inject 0-10 units subcutaneously before meals and at bedtime per sliding scale: CBG 0-250 0  units, 251-350 4 units, 351-450 6 units, 451-550 8 units, 551-600 10 units and call provider 07/22/16  Yes [provider]  insulin glargine (LANTUS) 100 UNIT/ML injection Inject 0.35 mLs (35 Units total) into the skin at bedtime. Patient taking differently: Inject 43 Units into the skin daily.  09/18/16  Yes Reyne Dumas, MD  loperamide (IMODIUM A-D) 2 MG tablet Take 2-4 mg by mouth See admin instructions. Take 2 tablets (4 mg) by mouth for initial dose, then take 1 tablet (2 mg) every 6 hours - as needed for diarrhea.   Yes [provider]  Methylcellulose, Laxative, 500 MG TABS Take 500 mg by mouth daily.    Yes [provider]  metoprolol succinate (TOPROL-XL) 25 MG 24 hr tablet Take 1 tablet (25 mg total) by mouth daily. 09/19/16  Yes Reyne Dumas, MD  Multiple Vitamin (MULTIVITAMIN WITH MINERALS) TABS tablet Take 1 tablet by mouth at bedtime.   Yes [provider]  pantoprazole (PROTONIX) 20 MG tablet Take 20 mg by mouth daily.   Yes [provider]  simvastatin (ZOCOR) 20 MG tablet Take 20 mg by mouth at bedtime.  02/24/15  Yes [provider]  sitaGLIPtin (JANUVIA) 100 MG tablet Take 100 mg daily by mouth.   Yes [provider]  pantoprazole (PROTONIX) 40 MG tablet Take 1 tablet (40 mg total) daily by mouth. Switch for any other PPI at similar dose and frequency Patient not taking: Reported on 05/15/2018 05/05/17   Yetta Flock, MD  potassium chloride SA (K-DUR,KLOR-CON) 20 MEQ tablet Take 1 tablet (20 mEq total) by mouth daily. Patient not taking: Reported on 05/15/2018 09/18/16   Reyne Dumas, MD    Physical Exam: Vitals:   05/15/18 1745 05/15/18 1800  BP: (!) 156/55 (!) 151/67  Pulse: 69 67  Resp: 20 19  Temp:    SpO2: 97% 97%    Constitutional:   . Appears calm and comfortable.  Significant hair loss noted. Eyes:  . pupils and irises appear normal . Normal lids  ENMT:  . grossly normal hearing  . Lips  appear normal . Tongue appears unremarkable. Neck:  . neck appears normal, no masses . no thyromegaly Respiratory:  . CTA bilaterally, no w/r/r.  . Respiratory effort normal.  Cardiovascular:  . RRR, no m/r/g . No LE extremity edema   Abdomen:  . Soft, nontender, nondistended Musculoskeletal:  . Digits/nails BUE: no clubbing, cyanosis, petechiae, infection . RUE, LUE, RLE, LLE   o strength and tone normal, no atrophy, no abnormal movements o No tenderness, masses Skin:  . No rashes, lesions, ulcers . palpation of skin: no induration or nodules Neurologic:  . CN 2-12 intact . No upper extremity dysdiadochokinesis. Psychiatric:  . Mental status o Mood, affect appropriate o Oriented to self, "Elvina Sidle", "October, 2020" . judgment and insight difficult to assess   I have personally reviewed following labs and imaging studies  Labs:   Basic metabolic panel unremarkable.  LFTs unremarkable.  Troponin negative.  CBC unremarkable.  Urinalysis negative.  Urine drug screen negative.  Imaging studies:   MRI brain negative for acute infarct.  Medical tests:   EKG independently reviewed: Sinus rhythm, left bundle branch block, no acute changes.  Compared to previous study 09/14/2016, left bundle branch block is old.   Active Problems:   Diabetes mellitus ([place])   Acute encephalopathy   Seizure ([place])   Assessment/Plan Acute encephalopathy with fall prior to admission.  No evidence of infection, no metabolic derangement.  No evidence of neurogenic or cardiogenic etiology.  MRI brain negative.  EKG unremarkable.  Reportedly the patient may have dementia which may account for confusion after fall.  Neurology concern for seizure. --We will start Keppra, monitor on telemetry.  Neurochecks.  Fall precautions. --Check orthostatics --EEG if possible, otherwise this could be deferred to the outpatient setting.  Diabetes mellitus type 2 --Appears well controlled.  Continue  long-acting insulin.  Hyperlipidemia --Continue statin.  PMH stroke --Continue clopidogrel  Severity of Illness: The appropriate patient status for this patient is OBSERVATION. Observation status is judged to be reasonable and necessary in order to provide the required intensity of service to ensure the patient's safety. The patient's presenting symptoms, physical exam findings, and initial radiographic and laboratory data in the context of their medical condition is felt to place them at decreased risk for further clinical deterioration. Furthermore, it is anticipated that the patient will be medically stable for discharge from the hospital within 2 midnights of admission. The following factors support the patient status of observation.   " The patient's presenting symptoms include fall, confusion. " The physical exam findings include nonfocal exam. " The initial radiographic and laboratory data are negative for acute abnormalities.  There is concern for possible seizure as above.  DVT prophylaxis:SCDs Code Status: DNR Family Communication: sister at bedside Consults called: neurology    Time spent: 43 minutes  Murray Hodgkins, MD  [facility] Direct contact: [phone] --[place]  --[email or web address]; password [email or web address]  7PM-7AM contact night coverage as above  05/15/2018, 6:27 PM

## 2018-05-15 NOTE — ED Notes (Signed)
Pt and daughter aware pt has bed assignment [phone].

## 2018-05-15 NOTE — ED Notes (Signed)
Attempted report - RN to call back per Network engineer.

## 2018-05-16 ENCOUNTER — Observation Stay (HOSPITAL_COMMUNITY): Payer: Medicare (Managed Care)

## 2018-05-16 DIAGNOSIS — Z794 Long term (current) use of insulin: Secondary | ICD-10-CM | POA: Diagnosis not present

## 2018-05-16 DIAGNOSIS — E1159 Type 2 diabetes mellitus with other circulatory complications: Secondary | ICD-10-CM | POA: Diagnosis not present

## 2018-05-16 DIAGNOSIS — G9340 Encephalopathy, unspecified: Secondary | ICD-10-CM | POA: Diagnosis not present

## 2018-05-16 DIAGNOSIS — R569 Unspecified convulsions: Secondary | ICD-10-CM

## 2018-05-16 LAB — GLUCOSE, CAPILLARY
GLUCOSE-CAPILLARY: 242 mg/dL — AB (ref 70–99)
Glucose-Capillary: 386 mg/dL — ABNORMAL HIGH (ref 70–99)
Glucose-Capillary: 100 mg/dL — ABNORMAL HIGH (ref 70–99)

## 2018-05-16 LAB — URINE CULTURE: CULTURE: NO GROWTH

## 2018-05-16 MED ORDER — LEVETIRACETAM 250 MG PO TABS: 250.0000 mg | ORAL_TABLET | Freq: Two times a day (BID) | ORAL | 0 refills | Status: DC

## 2018-05-16 MED ORDER — DIVALPROEX SODIUM 250 MG PO DR TAB: 250.0000 mg | DELAYED_RELEASE_TABLET | Freq: Two times a day (BID) | ORAL | 0 refills | Status: AC

## 2018-05-16 MED ORDER — INSULIN ASPART 100 UNIT/ML ~~LOC~~ SOLN
10.0000 [IU] | Freq: Once | SUBCUTANEOUS | Status: AC
  Administered 2018-05-16: 10 [IU] via SUBCUTANEOUS

## 2018-05-16 NOTE — Progress Notes (Signed)
EEG completed, results pending. 

## 2018-05-16 NOTE — Progress Notes (Signed)
CSW on hold with Network engineer at [facility] for large length of time, Network engineer having difficulties finding a nurse to admit patient today. Secretary took [place] phone number and states she will call back to notify CSW if patient can be admitted back to [facility] on this day.   Will continue to follow up.  [place], [place]

## 2018-05-16 NOTE — Progress Notes (Addendum)
Attempted report at [facility] no response.  16:00 Report given to nurse at [facility] 100, awaiting patients sister to return to hospital.

## 2018-05-16 NOTE — Progress Notes (Signed)
Notified Dr. Reesa Chew of patients blood sugar 386, new orders noted.

## 2018-05-16 NOTE — Progress Notes (Signed)
Reason for consult:   Subjective: Patient lying comfortably in bed with her sister.  According to her sister she is back to her baseline.   ROS: negative except above Examination  Vital signs in last 24 hours: Temp:  [97.7 F (36.5 C)-98.2 F (36.8 C)] 98 F (36.7 C) (11/17 1200) Pulse Rate:  [59-74] 59 (11/17 1200) Resp:  [15-26] 16 (11/17 1200) BP: (139-171)/(48-70) 156/54 (11/17 1200) SpO2:  [94 %-98 %] 96 % (11/17 1200)  General: lying in bed, well-nourished CVS: pulse-normal rate and rhythm RS: breathing comfortably Extremities: normal   Neuro: MS: Alert, oriented to self and family members, recognizes me CN: pupils equal and reactive,  EOMI, face symmetric, Motor: 5/5 strength in all 4 extremities Coordination: normal Gait: not tested  Basic Metabolic Panel: Recent Labs  Lab 05/15/18 1352 05/15/18 1355  NA 139 140  K 3.7 3.7  CL 109 107  CO2 22  --   GLUCOSE 207* 198*  BUN 15 18  CREATININE 1.21* 1.10*  CALCIUM 9.2  --     CBC: Recent Labs  Lab 05/15/18 1352 05/15/18 1355  WBC 8.7  --   NEUTROABS 4.8  --   HGB 13.2 12.6  HCT 39.1 37.0  MCV 94.9  --   PLT 205  --      Coagulation Studies: Recent Labs    05/15/18 1352  LABPROT 13.7  INR 1.06    Imaging Reviewed: MRI Brain  EEG: This is a normal recording of the awake and drowsy states.    ASSESSMENT AND PLAN 77 y.o. female PMH stroke, HLD, DM , dementia w/o behavioral disturbance presents with acute confusion, staring episode while shopping with her sister.  Came in as a code stroke.  MRI brain was negative for acute infarct. Metabolic and infectious work-up negative.  Patient is back to her baseline.  Has had similar episodes in the past of acute confusion raising suspicion of seizures.  Acute Encephalopathy possibly seizure  Recommendations Trial of Depakote 250 twice daily Seizure precautions Plavix for secondary prevention of stroke   Apurva Reily [facility] Pager Number [phone] For questions after 7pm please refer to AMION to reach the Neurologist on call

## 2018-05-16 NOTE — NC FL2 (Signed)
[place] LEVEL OF CARE SCREENING TOOL     IDENTIFICATION  Patient Name: Katie Lozano Birthdate: 05-Jul-1940 Sex: female Admission Date (Current Location): 05/15/2018  [facility] and [place] Number:  [employer] and Address:  [facility] [place], [place] [address] [place], [place] [address]      Provider Number: [id number]  Attending Physician Name and Address:  Damita Lack, MD  Relative Name and Phone Number:  Cala Bradford (sister & POA) [phone]    Current Level of Care: Hospital Recommended Level of Care: [place] Prior Approval Number:    Date Approved/Denied:   PASRR Number: [id number]  Discharge Plan: SNF    Current Diagnoses: Patient Active Problem List   Diagnosis Date Noted  . Seizure ([place]) 05/15/2018  . Cerebral embolism with cerebral infarction 09/16/2016  . Protein-calorie malnutrition, severe 09/15/2016  . Acute encephalopathy 09/14/2016  . 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 Diabetes mellitus ([place]) 09/02/2016  . Anemia 09/02/2016  . 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 NSTEMI (non-ST elevated myocardial infarction) ([place]) 07/22/2016  . Pressure injury of skin 07/17/2016  . UGIB (upper gastrointestinal bleed)   . Essential hypertension 06/06/2016  . CKD (chronic kidney disease), stage III ([place]) 06/06/2016  . Carotid artery syndrome 06/05/2016  . Type 2 diabetes mellitus with diabetic nephropathy, with long-term current use of insulin ([place]) 03/30/2015  . History of colonic polyps 03/30/2015  . Hyperlipidemia 03/30/2015    Orientation RESPIRATION BLADDER Height & Weight     Self, Place  Normal Continent Weight: 138 lb 3.7 oz (62.7 kg) Height:  5\' 2"  (157.5 cm)  BEHAVIORAL SYMPTOMS/MOOD NEUROLOGICAL BOWEL NUTRITION STATUS      Continent Diet(see discharge summary)  AMBULATORY STATUS COMMUNICATION OF NEEDS Skin   Limited Assist Verbally Normal                       Personal  Care Assistance Level of Assistance  Bathing, Feeding, Dressing, Total care Bathing Assistance: Limited assistance Feeding assistance: Independent Dressing Assistance: Limited assistance Total Care Assistance: Limited assistance   Functional Limitations Info  Sight, Hearing, Speech Sight Info: Adequate Hearing Info: Adequate Speech Info: Adequate    SPECIAL CARE FACTORS FREQUENCY  PT (By licensed PT), OT (By licensed OT)     PT Frequency: not assessed this visit OT Frequency: not assessed this visit, from SNF            Contractures Contractures Info: Not present    Additional Factors Info  Code Status, Allergies Code Status Info: DNR Allergies Info: Allergies:  Trulicity Dulaglutide           Current Medications (05/16/2018):  This is the current hospital active medication list Current Facility-Administered Medications  Medication Dose Route Frequency Provider Last Rate Last Dose  . 0.9 %  sodium chloride infusion  250 mL Intravenous PRN Samuella Cota, MD      . acetaminophen (TYLENOL) tablet 650 mg  650 mg Oral Q6H PRN Samuella Cota, MD       Or  . acetaminophen (TYLENOL) suppository 650 mg  650 mg Rectal Q6H PRN Samuella Cota, MD      . clopidogrel (PLAVIX) tablet 75 mg  75 mg Oral Daily Samuella Cota, MD   75 mg at 05/16/18 5631  . insulin aspart (novoLOG) injection 10 Units  10 Units Subcutaneous Once Amin, Ankit Chirag, MD      . insulin glargine (LANTUS) injection  35 Units  35 Units Subcutaneous Daily Samuella Cota, MD   35 Units at 05/16/18 [phone]  . levETIRAcetam (KEPPRA) tablet 250 mg  250 mg Oral BID Samuella Cota, MD   250 mg at 05/16/18 0102  . metoprolol succinate (TOPROL-XL) 24 hr tablet 25 mg  25 mg Oral Daily Samuella Cota, MD   25 mg at 05/16/18 7253  . pantoprazole (PROTONIX) EC tablet 20 mg  20 mg Oral Daily Samuella Cota, MD   20 mg at 05/16/18 0926  . simvastatin (ZOCOR) tablet 20 mg  20 mg Oral QHS Samuella Cota, MD   20 mg at 05/15/18 2101  . sodium chloride flush (NS) 0.9 % injection 3 mL  3 mL Intravenous Q12H Samuella Cota, MD   3 mL at 05/15/18 2103  . sodium chloride flush (NS) 0.9 % injection 3 mL  3 mL Intravenous Q12H Samuella Cota, MD   3 mL at 05/16/18 0919  . sodium chloride flush (NS) 0.9 % injection 3 mL  3 mL Intravenous PRN Samuella Cota, MD         Discharge Medications: Please see discharge summary for a list of discharge medications.  Relevant Imaging Results:  Relevant Lab Results:   Additional Information SSN: [id number]  Alberteen Sam, LCSW

## 2018-05-16 NOTE — Discharge Summary (Addendum)
Physician Discharge Summary  Katie Lozano KTG:[id number] DOB: 15-Jun-1941 DOA: 05/15/2018  PCP: Bernerd Limbo, MD  Admit date: 05/15/2018 Discharge date: 05/16/2018  Admitted From: Long-term living facility Disposition: Long-term living facility  Recommendations for Outpatient Follow-up:  1. Follow up with PCP in 1-2 weeks 2. Please obtain BMP/CBC in one week your next doctors visit.  3. Depakote 250mg  po bid.  Needs to follow-up outpatient with neurology.  Patient does not drive. 4. Outpatient EEG ordered  Home Health: None Equipment/Devices: None Discharge Condition: Stable CODE STATUS: DNR Diet recommendation: Diabetic  Brief/Interim Summary: 77 year old with history of diabetes mellitus type 2, hyperlipidemia came to the hospital with concerns of confusion and aphasia.  MRI of the brain was negative.  Neurology had seen the patient who suspected patient may have that there was a possibility of possible seizure.  Low-dose Depakote 250 mg twice daily was added.  She did much better in the hospital and did not have any further more complaints.  Patient was seen by neurology who recommended outpatient follow-up and also possibly trying to get outpatient EEG as well if necessary.  No complaints at this time.  Stable for discharge.  I spoke with the patient's sister who is a power of attorney as well.   Discharge Diagnoses:  Active Problems:   Diabetes mellitus ([place])   Acute encephalopathy   Seizure ([place])  Acute metabolic encephalopathy concerning for seizure -Depkote 250 mg twice daily has been added per neurology recommendation.  Outpatient EEG -Patient is asymptomatic at this time, stable to be discharged.  At baseline she does not drive.  Diabetes mellitus type 2 -Resume home medications including insulin.  Hyperlipidemia -Continue statin  History of CVA -Continue Plavix  Spoke extensively with the patient's sister regarding her care.  She is in agreement. SCDs for  DVT prophylaxis while here Patient is DNR/DNI Discharge in stable condition.  Discharge Instructions   Allergies as of 05/16/2018      Reactions   Trulicity [dulaglutide] Diarrhea      Medication List    TAKE these medications   acetaminophen 325 MG tablet Commonly known as:  TYLENOL Take 650 mg by mouth every 4 (four) hours as needed for fever.   CERAVE Crea Apply 1 application topically See admin instructions. Apply topically to face/feet twice daily for dry skin   chlorhexidine 0.12 % solution Commonly known as:  PERIDEX Use as directed 15 mLs in the mouth or throat every 12 (twelve) hours as needed (gingivitis).   cholecalciferol 25 MCG (1000 UT) tablet Commonly known as:  VITAMIN D3 Take 2,000 Units by mouth at bedtime.   clopidogrel 75 MG tablet Commonly known as:  PLAVIX Take 1 tablet (75 mg total) by mouth daily.   insulin aspart 100 UNIT/ML injection Commonly known as:  novoLOG Inject 0-10 Units into the skin See admin instructions. Inject 0-10 units subcutaneously before meals and at bedtime per sliding scale: CBG 0-250 0 units, 251-350 4 units, 351-450 6 units, 451-550 8 units, 551-600 10 units and call provider   insulin glargine 100 UNIT/ML injection Commonly known as:  LANTUS Inject 0.35 mLs (35 Units total) into the skin at bedtime. What changed:    how much to take  when to take this   levETIRAcetam 250 MG tablet Commonly known as:  KEPPRA Take 1 tablet (250 mg total) by mouth 2 (two) times daily.   loperamide 2 MG tablet Commonly known as:  IMODIUM A-D Take 2-4 mg by mouth See admin  instructions. Take 2 tablets (4 mg) by mouth for initial dose, then take 1 tablet (2 mg) every 6 hours - as needed for diarrhea.   Methylcellulose (Laxative) 500 MG Tabs Take 500 mg by mouth daily.   metoprolol succinate 25 MG 24 hr tablet Commonly known as:  TOPROL-XL Take 1 tablet (25 mg total) by mouth daily.   multivitamin with minerals Tabs tablet Take 1  tablet by mouth at bedtime.   pantoprazole 20 MG tablet Commonly known as:  PROTONIX Take 20 mg by mouth daily.   pantoprazole 40 MG tablet Commonly known as:  PROTONIX Take 1 tablet (40 mg total) daily by mouth. Switch for any other PPI at similar dose and frequency   potassium chloride SA 20 MEQ tablet Commonly known as:  K-DUR,KLOR-CON Take 1 tablet (20 mEq total) by mouth daily.   simvastatin 20 MG tablet Commonly known as:  ZOCOR Take 20 mg by mouth at bedtime.   sitaGLIPtin 100 MG tablet Commonly known as:  JANUVIA Take 100 mg daily by mouth.      Follow-up Information    Bernerd Limbo, MD. Schedule an appointment as soon as possible for a visit in 1 week(s).   Specialty:  Family Medicine Contact information: [place] [address] [phone]        [facility]. Schedule an appointment as soon as possible for a visit in 4 week(s).   Contact information: [address]     [address] [place] [address] [phone]         Allergies  Allergen Reactions  . Trulicity [Dulaglutide] Diarrhea    You were cared for by a hospitalist during your hospital stay. If you have any questions about your discharge medications or the care you received while you were in the hospital after you are discharged, you can call the unit and asked to speak with the hospitalist on call if the hospitalist that took care of you is not available. Once you are discharged, your primary care physician will handle any further medical issues. Please note that no refills for any discharge medications will be authorized once you are discharged, as it is imperative that you return to your primary care physician (or establish a relationship with a primary care physician if you do not have one) for your aftercare needs so that they can reassess your need for medications and monitor your lab  values.  Consultations:  Neurology   Procedures/Studies: Mr Brain 36 Contrast  Result Date: 05/15/2018 CLINICAL DATA:  77 year old female code stroke presentation, weakness and aphasia. Limited study by neurology request. EXAM: LIMITED MRI HEAD WITHOUT CONTRAST TECHNIQUE: Multiplanar, multiecho pulse sequences of the brain and surrounding structures were obtained without intravenous contrast. COMPARISON:  Head CT without contrast 1355 hours today and earlier including brain MRI 09/14/2016. FINDINGS: Diffusion-weighted, T2, FLAIR, and susceptibility weighted images only. Brain: No restricted diffusion or evidence of acute infarction. Right superior parietal lobe cortical and subcortical white matter encephalomalacia as seen on CT today (series 8, image 20). Small new T2 hyperintense focus in the right caudate is chronic also. Scattered and patchy cerebral white matter T2 and FLAIR hyperintensity elsewhere has not significantly changed. No midline shift, mass effect, ventriculomegaly, extra-axial collection. Vascular: Major intracranial vascular flow voids are stable since 2018. Sinuses/Orbits: Stable and negative. IMPRESSION: 1. Negative for acute infarct. 2. 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 Electronically Signed   By: Genevie Ann M.D.   On: 05/15/2018 15:29   Ct Head Code Stroke Wo Contrast  Result Date: 05/15/2018 CLINICAL DATA:  Code stroke. 77 year old female with weakness and aphasia. EXAM: CT HEAD WITHOUT CONTRAST TECHNIQUE: Contiguous axial images were obtained from the base of the skull through the vertex without intravenous contrast. COMPARISON:  CT and CTA head 09/16/2016. Brain MRI 09/14/2016. FINDINGS: Brain: A small area of cortical and subcortical white matter encephalomalacia in the right superior parietal lobe has progressed since 2018 but appears chronic. New but chronic appearing small  lacunar infarct of the right caudate nucleus on series 3, image 14. Elsewhere stable gray-white matter differentiation throughout the brain and stable cerebral volume. No midline shift, ventriculomegaly, mass effect, evidence of mass lesion, intracranial hemorrhage or evidence of cortically based acute infarction. Vascular: Calcified atherosclerosis at the skull base. No suspicious intracranial vascular hyperdensity. Skull: Advanced left TMJ degeneration. No acute osseous abnormality identified. Sinuses/Orbits: Visualized paranasal sinuses and mastoids are stable and well pneumatized. Other: Visualized orbits and scalp soft tissues are within normal limits. ASPECTS [facility] Stroke Program Early CT Score) - Ganglionic level infarction (caudate, lentiform nuclei, internal capsule, insula, M1-M3 cortex): 7 - Supraganglionic infarction (M4-M6 cortex): 3 Total score (0-10 with 10 being normal): 10 IMPRESSION: 1. Increased since 2018 but chronic appearing ischemic disease in the right caudate and superior right parietal lobe. 2. No acute cortically based infarct or acute intracranial hemorrhage identified. ASPECTS is 10. 3. These results were communicated to Dr. Lorraine Lax at 2:06 pmon 11/16/2019by text page via the [facility] messaging system. Electronically Signed   By: Genevie Ann M.D.   On: 05/15/2018 14:07      Subjective: No complaints, feeling better.  Spoke with the sister over the phone as well.  General = no fevers, chills, dizziness, malaise, fatigue HEENT/EYES = negative for pain, redness, loss of vision, double vision, blurred vision, loss of hearing, sore throat, hoarseness, dysphagia Cardiovascular= negative for chest pain, palpitation, murmurs, lower extremity swelling Respiratory/lungs= negative for shortness of breath, cough, hemoptysis, wheezing, mucus production Gastrointestinal= negative for nausea, vomiting,, abdominal pain, melena, hematemesis Genitourinary= negative for Dysuria, Hematuria, Change in  Urinary Frequency MSK = Negative for arthralgia, myalgias, Back Pain, Joint swelling  Neurology= Negative for headache, seizures, numbness, tingling  Psychiatry= Negative for anxiety, depression, suicidal and homocidal ideation Allergy/Immunology= Medication/Food allergy as listed  Skin= Negative for Rash, lesions, ulcers, itching   Discharge Exam: Vitals:   05/16/18 0500 05/16/18 0736  BP: (!) 141/70 (!) 159/51  Pulse: 65 65  Resp: 16 15  Temp: 98.2 F (36.8 C) 98 F (36.7 C)  SpO2: 94% 94%   Vitals:   05/15/18 1940 05/15/18 2345 05/16/18 0500 05/16/18 0736  BP: (!) 166/66 (!) 157/62 (!) 141/70 (!) 159/51  Pulse: 67 69 65 65  Resp: 18 18 16 15   Temp: 97.8 F (36.6 C) 97.8 F (36.6 C) 98.2 F (36.8 C) 98 F (36.7 C)  TempSrc: Oral Oral Oral Oral  SpO2: 95% 96% 94% 94%  Weight:      Height:        General: Pt is alert, awake, not in acute distress; elderly frail Cardiovascular: RRR, S1/S2 +, no rubs, no gallops Respiratory: CTA bilaterally, no wheezing, no rhonchi Abdominal: Soft, NT, ND, bowel sounds + Extremities: no edema, no cyanosis    The results of significant diagnostics from this hospitalization (including imaging, microbiology, ancillary and laboratory) are listed below for reference.  Microbiology: Recent Results (from the past 240 hour(s))  Urine culture     Status: None   Collection Time: 05/15/18  3:56 PM  Result Value Ref Range Status   Specimen Description URINE, RANDOM  Final   Special Requests NONE  Final   Culture   Final    NO GROWTH Performed at [facility], [address], [place], [place] [address]    Report Status 05/16/2018 FINAL  Final  MRSA PCR Screening     Status: None   Collection Time: 05/15/18  8:06 PM  Result Value Ref Range Status   MRSA by PCR NEGATIVE NEGATIVE Final    Comment:        The GeneXpert MRSA Assay (FDA approved for NASAL specimens only), is one component of a comprehensive MRSA  colonization surveillance program. It is not intended to diagnose MRSA infection nor to guide or monitor treatment for MRSA infections. Performed at [facility], [place] [address] [place], [place] [address]      Labs: BNP (last 3 results) No results for input(s): BNP in the last 8760 hours. Basic Metabolic Panel: Recent Labs  Lab 05/15/18 1352 05/15/18 1355  NA 139 140  K 3.7 3.7  CL 109 107  CO2 22  --   GLUCOSE 207* 198*  BUN 15 18  CREATININE 1.21* 1.10*  CALCIUM 9.2  --    Liver Function Tests: Recent Labs  Lab 05/15/18 1352  AST 20  ALT 12  ALKPHOS 76  BILITOT 0.9  PROT 7.0  ALBUMIN 3.6   No results for input(s): LIPASE, AMYLASE in the last 168 hours. Recent Labs  Lab 05/15/18 1556  AMMONIA 18   CBC: Recent Labs  Lab 05/15/18 1352 05/15/18 1355  WBC 8.7  --   NEUTROABS 4.8  --   HGB 13.2 12.6  HCT 39.1 37.0  MCV 94.9  --   PLT 205  --    Cardiac Enzymes: No results for input(s): CKTOTAL, CKMB, CKMBINDEX, TROPONINI in the last 168 hours. BNP: Invalid input(s): POCBNP CBG: Recent Labs  Lab 05/15/18 1345 05/15/18 2121 05/16/18 0601  GLUCAP 201* 86 242*   D-Dimer No results for input(s): DDIMER in the last 72 hours. Hgb A1c No results for input(s): HGBA1C in the last 72 hours. Lipid Profile No results for input(s): CHOL, HDL, LDLCALC, TRIG, CHOLHDL, LDLDIRECT in the last 72 hours. Thyroid function studies No results for input(s): TSH, T4TOTAL, T3FREE, THYROIDAB in the last 72 hours.  Invalid input(s): FREET3 Anemia work up No results for input(s): VITAMINB12, FOLATE, FERRITIN, TIBC, IRON, RETICCTPCT in the last 72 hours. Urinalysis    Component Value Date/Time   COLORURINE YELLOW 05/15/2018 1556   APPEARANCEUR CLEAR 05/15/2018 1556   LABSPEC 1.021 05/15/2018 1556   PHURINE 5.0 05/15/2018 1556   GLUCOSEU 50 (A) 05/15/2018 1556   HGBUR NEGATIVE 05/15/2018 1556   BILIRUBINUR NEGATIVE 05/15/2018 1556   KETONESUR NEGATIVE  05/15/2018 1556   PROTEINUR NEGATIVE 05/15/2018 1556   NITRITE NEGATIVE 05/15/2018 1556   LEUKOCYTESUR NEGATIVE 05/15/2018 1556   Sepsis Labs Invalid input(s): PROCALCITONIN,  WBC,  LACTICIDVEN Microbiology Recent Results (from the past 240 hour(s))  Urine culture     Status: None   Collection Time: 05/15/18  3:56 PM  Result Value Ref Range Status   Specimen Description URINE, RANDOM  Final   Special Requests NONE  Final   Culture   Final    NO GROWTH Performed at [facility],  [address] [place], [place] [address]    Report Status 05/16/2018 FINAL  Final  MRSA PCR Screening     Status: None   Collection Time: 05/15/18  8:06 PM  Result Value Ref Range Status   MRSA by PCR NEGATIVE NEGATIVE Final    Comment:        The GeneXpert MRSA Assay (FDA approved for NASAL specimens only), is one component of a comprehensive MRSA colonization surveillance program. It is not intended to diagnose MRSA infection nor to guide or monitor treatment for MRSA infections. Performed at [facility], [place] [address], [place], [place] [address]      Time coordinating discharge:  I have spent 35 minutes face to face with the patient and on the ward discussing the patients care, assessment, plan and disposition with other care givers. >50% of the time was devoted counseling the patient about the risks and benefits of treatment/Discharge disposition and coordinating care.   SIGNED:   Damita Lack, MD  [facility] 05/16/2018, 10:39 AM Pager   If 7PM-7AM, please contact night-coverage [email or web address] Password [email or web address]

## 2018-05-16 NOTE — Clinical Social Work Note (Signed)
Clinical Social Work Assessment  Patient Details  Name: Katie Lozano MRN: [id number] Date of Birth: [birth year]  Date of referral:  05/16/18               Reason for consult:  Discharge Planning                Permission sought to share information with:  Case Manager, Facility Sport and exercise psychologist, Family Supports Permission granted to share information::  Yes, Verbal Permission Granted  Name::     [place]::  SNFs  Relationship::  sister  Contact Information:  [phone]  Housing/Transportation Living arrangements for the past 2 months:  [place] of Information:  Patient Patient Interpreter Needed:  None Criminal Activity/Legal Involvement Pertinent to Current Situation/Hospitalization:  No - Comment as needed Significant Relationships:  Siblings Lives with:  Self, Facility Resident Do you feel safe going back to the place where you live?  Yes Need for family participation in patient care:  Yes (Comment)  Care giving concerns:  CSW received referral for possible SNF placement at time of discharge. Spoke with patient regarding possibility of SNF placement . Patient's family and sister are currently unable to care for her at their home given patient's current needs and fall risk.  Patient and sister expressed understanding of PT recommendation and are agreeable to SNF placement at time of discharge. CSW to continue to follow and assist with discharge planning needs.    Social Worker assessment / plan:  Spoke with patient and  Sister concerning possibility of rehab at [facility] before returning home. She is from [facility] and will like to return there upon discharge.   Employment status:  Retired Forensic scientist:  Other (Comment Required)([employer]) PT Recommendations:  [place] / Referral to community resources:  [place]  Patient/Family's Response to care: Patient and sister    recognize  need for rehab before returning home and are agreeable to a SNF in [place]. They report preference for  Returning to [facility]   . CSW explained insurance authorization process. Patient's family reported that they want patient to get stronger to be able to come back home.    Patient/Family's Understanding of and Emotional Response to Diagnosis, Current Treatment, and Prognosis: Patient/family is realistic regarding therapy needs and expressed being hopeful for SNF placement to return to [facility] where patient has been living for SNF. Patient expressed understanding of CSW role and discharge process as well as medical condition. No questions/concerns about plan or treatment.    Emotional Assessment Appearance:  Appears stated age Attitude/Demeanor/Rapport:  Unable to Assess Affect (typically observed):  Unable to Assess Orientation:  Oriented to Self Alcohol / Substance use:  Not Applicable Psych involvement (Current and /or in the community):  No (Comment)  Discharge Needs  Concerns to be addressed:  Discharge Planning Concerns Readmission within the last 30 days:  No Current discharge risk:  Dependent with Mobility Barriers to Discharge:  Continued Medical Work up   [employer], LCSW 05/16/2018, 2:33 PM

## 2018-05-16 NOTE — Clinical Social Work Placement (Signed)
   CLINICAL SOCIAL WORK PLACEMENT  NOTE  Date:  05/16/2018  Patient Details  Name: Katie Lozano MRN: [id number] Date of Birth: Sep 25, 1940  Clinical Social Work is seeking post-discharge placement for this patient at the [place] level of care (*CSW will initial, date and re-position this form in  chart as items are completed):      Patient/family provided with [facility] Work Department's list of facilities offering this level of care within the geographic area requested by the patient (or if unable, by the patient's family).  Yes   Patient/family informed of their freedom to choose among providers that offer the needed level of care, that participate in Medicare, Medicaid or managed care program needed by the patient, have an available bed and are willing to accept the patient.  Yes   Patient/family informed of [facility]'s ownership interest in [facility] and [facility], as well as of the fact that they are under no obligation to receive care at these facilities.  PASRR submitted to EDS on       PASRR number received on 05/16/18     Existing PASRR number confirmed on       FL2 transmitted to all facilities in geographic area requested by pt/family on 05/16/18     FL2 transmitted to all facilities within larger geographic area on       Patient informed that his/her managed care company has contracts with or will negotiate with certain facilities, including the following:        Yes   Patient/family informed of bed offers received.  Patient chooses bed at Other - please specify in the comment section below:([facility])     Physician recommends and patient chooses bed at      Patient to be transferred to Other - please specify in the comment section below:([facility]) on 05/16/18.  Patient to be transferred to facility by family will transport     Patient family notified on 05/16/18 of transfer.  Name of family member  notified:  Alannah     PHYSICIAN       Additional Comment:    _______________________________________________ Alberteen Sam, LCSW 05/16/2018, 2:45 PM

## 2018-05-16 NOTE — Progress Notes (Signed)
Patient will DC to: [facility] Anticipated DC date: 05/16/18 Family notified: Electrical engineer by: family  Per MD patient ready for DC to [facility] . RN, patient, patient's family, and facility notified of DC. Discharge Summary sent to facility. RN given number for report [phone] patient going back to Room 116 in hall 100. DC packet on chart. Family transporting patient. CSW signing off.  [place], [place]

## 2018-05-16 NOTE — Progress Notes (Signed)
Katie Lozano with [employer] [phone] called and requested admit information and to fax insurance documents to social work. Sister Katie Lozano notified and gave authorization before information was given.

## 2018-05-16 NOTE — Procedures (Signed)
[place] A. Merlene Laughter, MD     [email or web address]           HISTORY: The patient is 77 year old female who presents with episode of confusion and altered mental status.  The studies been to evaluate for complex partial seizure as the etiology.  MEDICATIONS: Scheduled Meds: . clopidogrel  75 mg Oral Daily  . insulin glargine  35 Units Subcutaneous Daily  . levETIRAcetam  250 mg Oral BID  . metoprolol succinate  25 mg Oral Daily  . pantoprazole  20 mg Oral Daily  . simvastatin  20 mg Oral QHS  . sodium chloride flush  3 mL Intravenous Q12H  . sodium chloride flush  3 mL Intravenous Q12H   Continuous Infusions: . sodium chloride     PRN Meds:.sodium chloride, acetaminophen **OR** acetaminophen, sodium chloride flush  Prior to Admission medications   Medication Sig Start Date End Date Taking? Authorizing Provider  acetaminophen (TYLENOL) 325 MG tablet Take 650 mg by mouth every 4 (four) hours as needed for fever.   Yes [provider]  chlorhexidine (PERIDEX) 0.12 % solution Use as directed 15 mLs in the mouth or throat every 12 (twelve) hours as needed (gingivitis).   Yes [provider]  cholecalciferol (VITAMIN D3) 25 MCG (1000 UT) tablet Take 2,000 Units by mouth at bedtime.   Yes [provider]  clopidogrel (PLAVIX) 75 MG tablet Take 1 tablet (75 mg total) by mouth daily. 09/19/16  Yes Reyne Dumas, MD  Emollient (CERAVE) CREA Apply 1 application topically See admin instructions. Apply topically to face/feet twice daily for dry skin   Yes [provider]  insulin aspart (NOVOLOG) 100 UNIT/ML injection Inject 0-10 Units into the skin See admin instructions. Inject 0-10 units subcutaneously before meals and at bedtime per sliding scale: CBG 0-250 0 units, 251-350 4 units, 351-450 6 units, 451-550 8 units, 551-600 10 units and call provider 07/22/16  Yes [provider]  insulin glargine (LANTUS) 100 UNIT/ML injection  Inject 0.35 mLs (35 Units total) into the skin at bedtime. Patient taking differently: Inject 43 Units into the skin daily.  09/18/16  Yes Reyne Dumas, MD  loperamide (IMODIUM A-D) 2 MG tablet Take 2-4 mg by mouth See admin instructions. Take 2 tablets (4 mg) by mouth for initial dose, then take 1 tablet (2 mg) every 6 hours - as needed for diarrhea.   Yes [provider]  Methylcellulose, Laxative, 500 MG TABS Take 500 mg by mouth daily.    Yes [provider]  metoprolol succinate (TOPROL-XL) 25 MG 24 hr tablet Take 1 tablet (25 mg total) by mouth daily. 09/19/16  Yes Reyne Dumas, MD  Multiple Vitamin (MULTIVITAMIN WITH MINERALS) TABS tablet Take 1 tablet by mouth at bedtime.   Yes [provider]  pantoprazole (PROTONIX) 20 MG tablet Take 20 mg by mouth daily.   Yes [provider]  simvastatin (ZOCOR) 20 MG tablet Take 20 mg by mouth at bedtime.  02/24/15  Yes [provider]  sitaGLIPtin (JANUVIA) 100 MG tablet Take 100 mg daily by mouth.   Yes [provider]  divalproex (DEPAKOTE) 250 MG DR tablet Take 1 tablet (250 mg total) by mouth 2 (two) times daily. 05/16/18 05/16/19  Amin, Jeanella Flattery, MD  pantoprazole (PROTONIX) 40 MG tablet Take 1 tablet (40 mg total) daily by mouth. Switch for any other PPI at similar dose and frequency Patient not taking: Reported on 05/15/2018 05/05/17   Armbruster, Carlota Raspberry, MD  potassium chloride SA (K-DUR,KLOR-CON) 20 MEQ tablet Take 1 tablet (20 mEq total) by mouth daily. Patient not taking: Reported on 05/15/2018 09/18/16   Reyne Dumas, MD      ANALYSIS: A 16 channel recording using standard 10 20 measurements is conducted for 21 minutes.  There is a well-formed posterior dominant rhythm of 9-9.5 Hz which attenuates with eye-opening.  There is beta activity observed in frontal areas.  Awake and drowsy activities are observed.  There is occasional ill formed K complexes observed.  Photic stimulation and  hyperventilation are not conducted.  There is no focal or lateralized slowing.  There is no epileptiform activity is observed.   IMPRESSION: 1.  This is a normal recording of the awake and drowsy states.      Jasraj Lappe A. Merlene Laughter, M.D.  Diplomate, Tax adviser of Psychiatry and Neurology ( Neurology).

## 2018-05-16 NOTE — Progress Notes (Signed)
Patient discharged in stable condition with all belongings and sister at bedside. Sister verbalized understanding of all discharge instructions and will give packet to facility.

## 2018-07-28 ENCOUNTER — Ambulatory Visit: Payer: PPO | Admitting: Diagnostic Neuroimaging

## 2018-08-30 IMAGING — MR MR MRA HEAD W/O CM
9 of 11 series · 30 of 48 positions shown · non-contrast
Comparison: CT HEAD June 05, 2016

CLINICAL DATA: Fatigue, vision changes, LEFT arm numbness, slurred
speech today. Similar episode around Thanksgiving. Possible TIA.
History of diabetes, hypertension.

EXAM:
MRI HEAD WITHOUT CONTRAST
MRA HEAD WITHOUT CONTRAST
TECHNIQUE: Multiplanar, multiecho pulse sequences of the brain and surrounding
structures were obtained without intravenous contrast. Angiographic
images of the head were obtained using MRA technique without
contrast.

[Series 2: FLAIR · sagittal · 5.0mm · 0.47mm/px · 1 of 23 slices shown (1 of 2)]
[im 1/23]
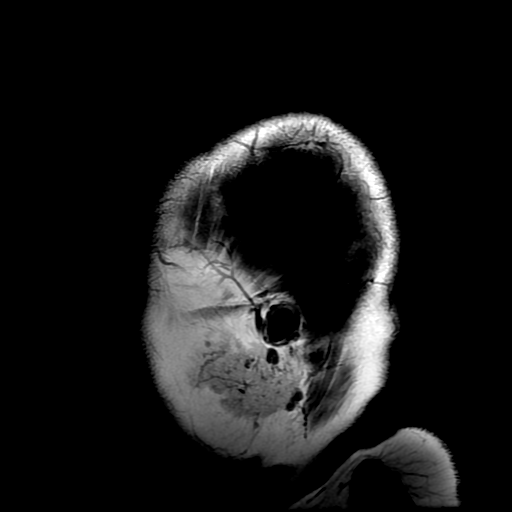

[Series 4: DWI · axial · 3.0mm · 0.94mm/px · z∈[-60,+86]mm · 7 of 100 slices shown (1 of 2)]
[im 1/100]
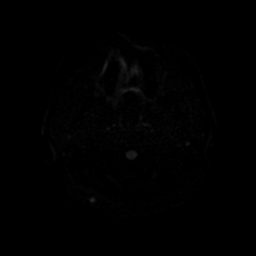
[im 17/100]
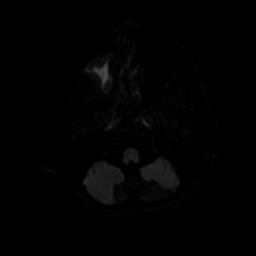
[im 34/100]
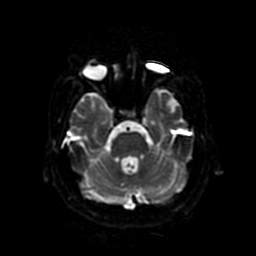
[im 50/100]
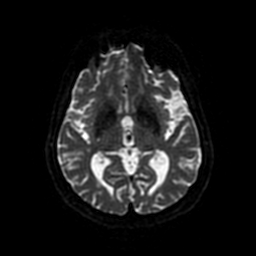
[im 67/100]
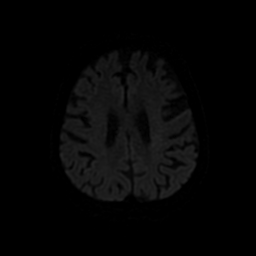
[im 83/100]
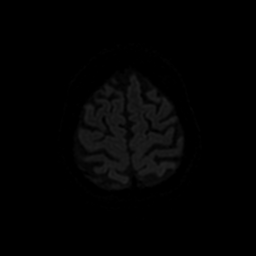
[im 100/100]
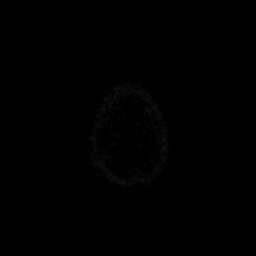

[Series 5: ax (id) 2 · axial · 1.0mm · 0.43mm/px · z∈[-37,+16]mm · 5 of 184 slices shown]
[im 1/184]
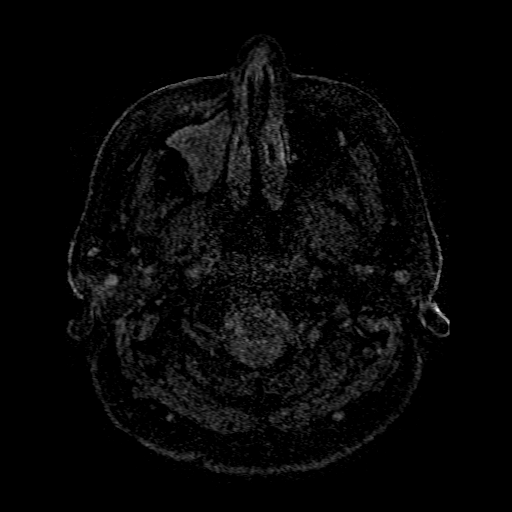
[im 31/184]
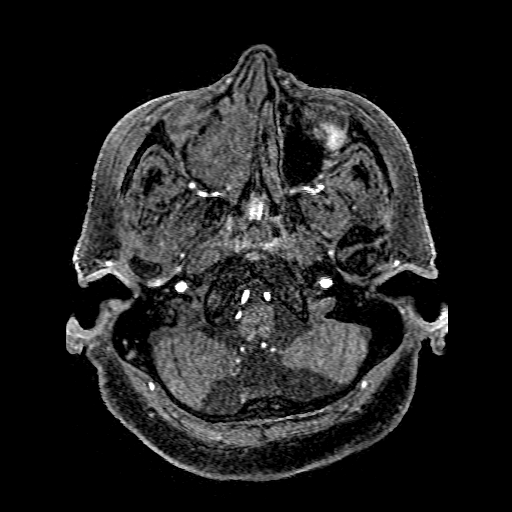
[im 62/184]
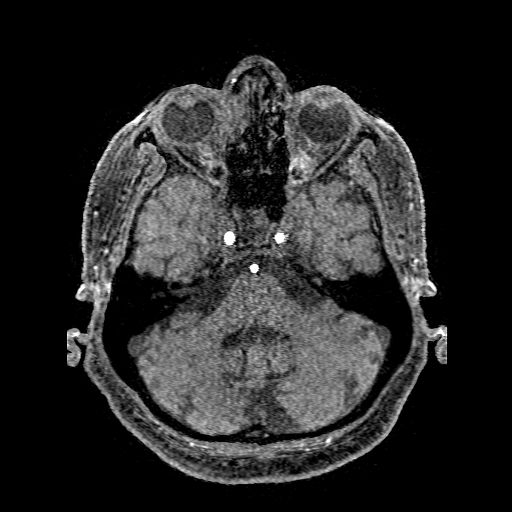
[im 77/184]
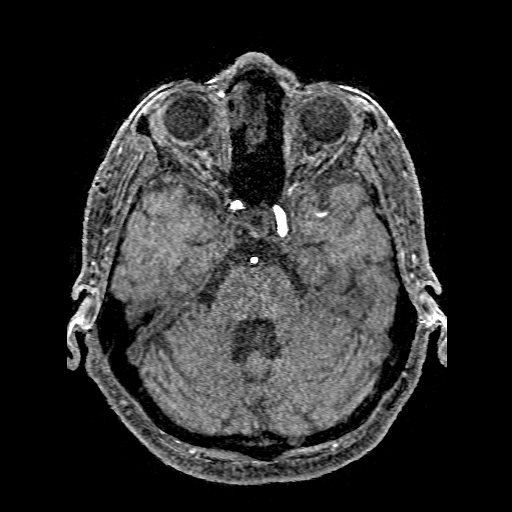
[im 107/184]
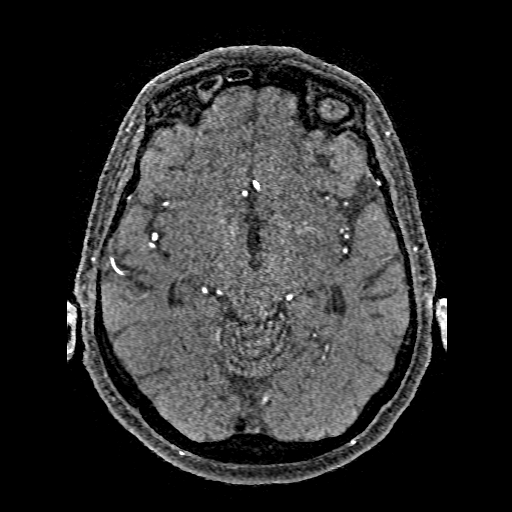

[Series 6: T2 · axial · 5.0mm · 0.43mm/px · z∈[-57,+85]mm · 2 of 25 slices shown (1 of 2)]
[im 1/25]
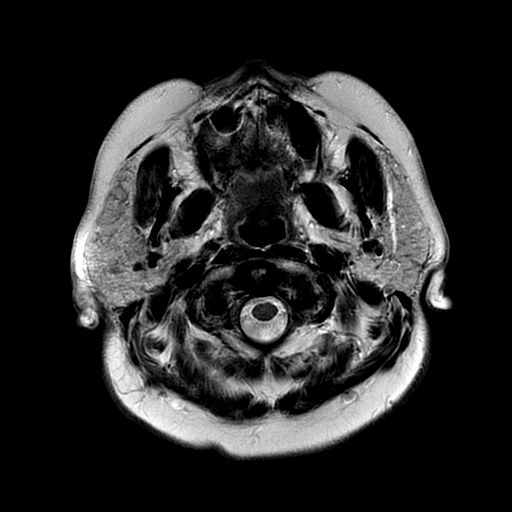
[im 25/25]
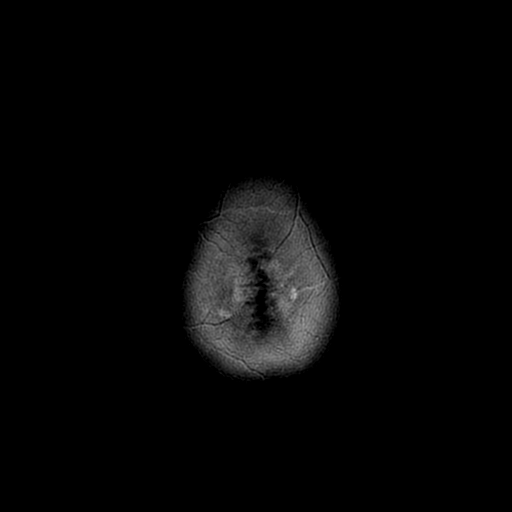

[Series 7: FLAIR · axial · 5.0mm · 0.43mm/px · z∈[-57,+85]mm · 2 of 25 slices shown (2 of 2)]
[im 1/25]
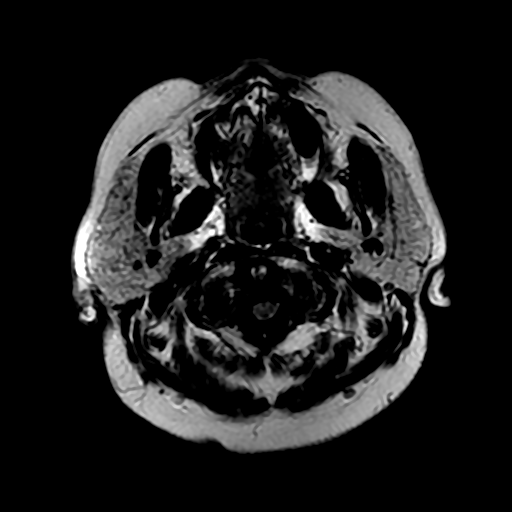
[im 25/25]
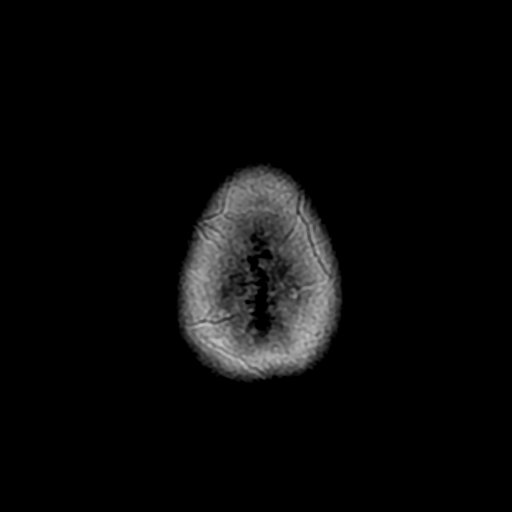

[Series 8: DWI · coronal · 4.0mm · 0.94mm/px · 5 of 66 slices shown (2 of 2)]
[im 1/66]
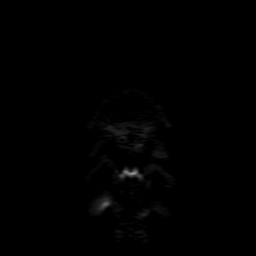
[im 17/66]
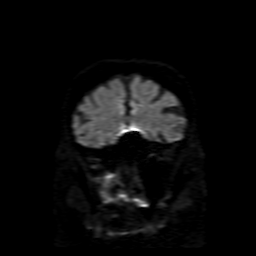
[im 33/66]
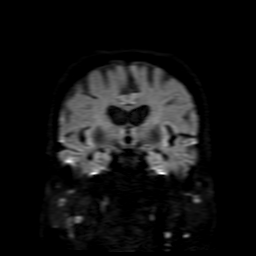
[im 49/66]
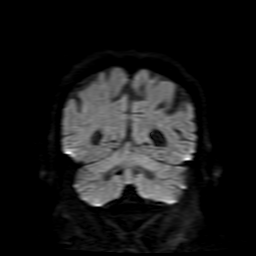
[im 66/66]
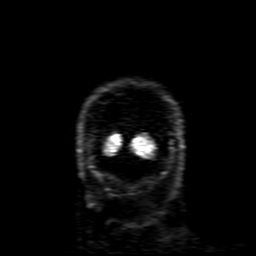

[Series 11: T2 · coronal · 5.0mm · 0.39mm/px · 2 of 28 slices shown (2 of 2)]
[im 1/28]
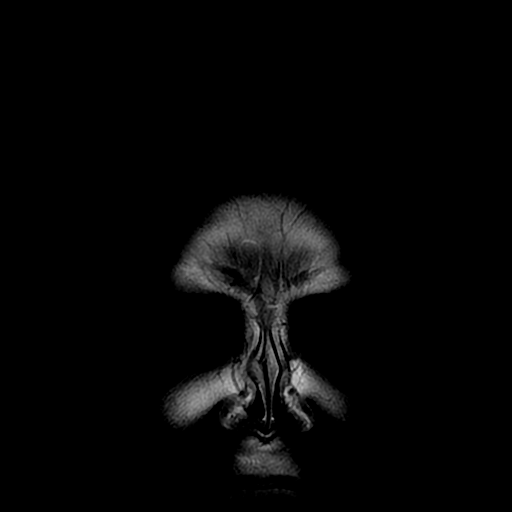
[im 28/28]
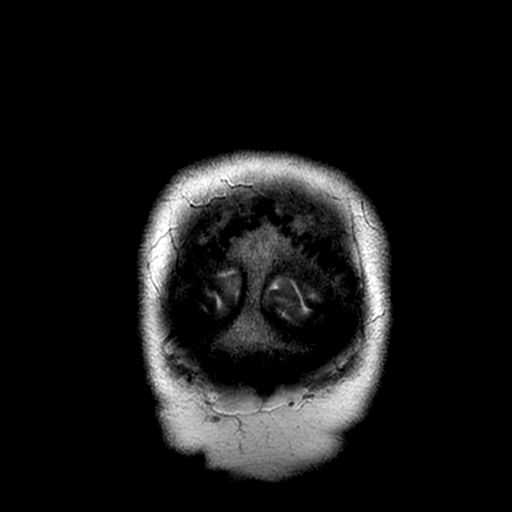

[Series 450: ADC · axial · 3.0mm · 0.94mm/px · z∈[-60,+86]mm · 4 of 50 slices shown (1 of 2)]
[im 1/50]
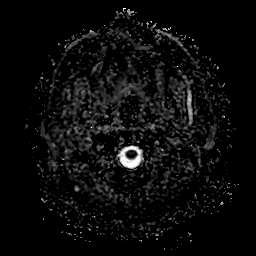
[im 17/50]
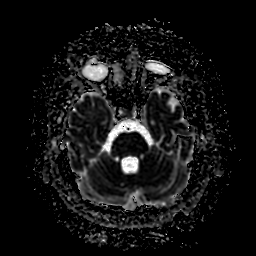
[im 33/50]
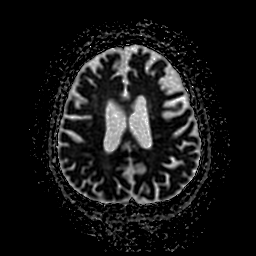
[im 50/50]
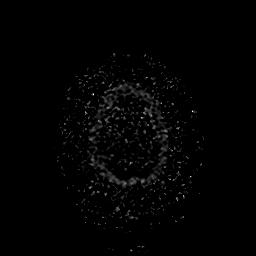

[Series 850: ADC · coronal · 4.0mm · 0.94mm/px · 2 of 33 slices shown (2 of 2)]
[im 1/33]
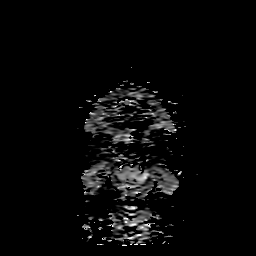
[im 33/33]
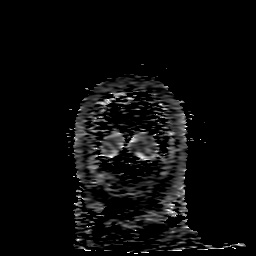

[30 of 48 positions shown; findings below may reference images not displayed]

FINDINGS: MRI HEAD FINDINGS

BRAIN: No reduced diffusion to suggest acute ischemia. No
susceptibility artifact to suggest hemorrhage. The ventricles and
sulci are normal for patient's age. Scattered subcentimeter
supratentorial white matter FLAIR T2 hyperintensities compatible
with mild to moderate chronic small vessel ischemic disease No
suspicious parenchymal signal, masses or mass effect. No abnormal
extra-axial fluid collections. No extra-axial masses though,
contrast enhanced sequences would be more sensitive.

VASCULAR: Normal major intracranial vascular flow voids present at
skull base.

SKULL AND UPPER CERVICAL SPINE: No abnormal sellar expansion. C2-3
segmentation anomaly suspected. No suspicious calvarial bone marrow
signal. Craniocervical junction maintained.

SINUSES/ORBITS: Severe chronic RIGHT maxillary sinusitis, RIGHT
ethmoid sinusitis with mild RIGHT frontal sinus mucosal thickening.
Mastoid air cells are well aerated. The included ocular globes and
orbital contents are non-suspicious.

OTHER: Mild RIGHT neck lymphadenopathy suspected.

MRA HEAD FINDINGS

ANTERIOR CIRCULATION: Normal flow related enhancement of the
included cervical, petrous, cavernous and supraclinoid internal
carotid arteries. Patent anterior communicating artery. Normal flow
related enhancement of the anterior and middle cerebral arteries,
including distal segments.

No large vessel occlusion, high-grade stenosis, abnormal luminal
irregularity, aneurysm.

POSTERIOR CIRCULATION: Codominant vertebral artery's. Basilar artery
is patent, with normal flow related enhancement of the main branch
vessels. Normal flow related enhancement of the posterior cerebral
arteries. Robust RIGHT posterior communicating artery present.

No large vessel occlusion, high-grade stenosis, abnormal luminal
irregularity, aneurysm.
IMPRESSION: MRI HEAD: No acute intracranial process, specifically no acute
ischemia.

Involutional changes and mild to moderate chronic small vessel
ischemic disease.

Suspected RIGHT neck lymphadenopathy, or recommend correlation with
physical examination.

MRA HEAD: Negative.

## 2019-10-08 ENCOUNTER — Emergency Department (HOSPITAL_COMMUNITY)
Admission: EM | Admit: 2019-10-08 | Discharge: 2019-10-08 | Disposition: A | Discharge status: Home / Self Care | Payer: Medicare (Managed Care) | Attending: Emergency Medicine | Admitting: Emergency Medicine

## 2019-10-08 ENCOUNTER — Emergency Department (HOSPITAL_COMMUNITY): Payer: Medicare (Managed Care)

## 2019-10-08 ENCOUNTER — Encounter (HOSPITAL_COMMUNITY): Payer: Self-pay | Admitting: Pharmacy Technician

## 2019-10-08 ENCOUNTER — Other Ambulatory Visit: Payer: Self-pay

## 2019-10-08 DIAGNOSIS — R631 Polydipsia: Secondary | ICD-10-CM | POA: Insufficient documentation

## 2019-10-08 DIAGNOSIS — Z7902 Long term (current) use of antithrombotics/antiplatelets: Secondary | ICD-10-CM | POA: Diagnosis not present

## 2019-10-08 DIAGNOSIS — N183 Chronic kidney disease, stage 3 unspecified: Secondary | ICD-10-CM | POA: Insufficient documentation

## 2019-10-08 DIAGNOSIS — I129 Hypertensive chronic kidney disease with stage 1 through stage 4 chronic kidney disease, or unspecified chronic kidney disease: Secondary | ICD-10-CM | POA: Insufficient documentation

## 2019-10-08 DIAGNOSIS — Z794 Long term (current) use of insulin: Secondary | ICD-10-CM | POA: Diagnosis not present

## 2019-10-08 DIAGNOSIS — R066 Hiccough: Secondary | ICD-10-CM | POA: Diagnosis not present

## 2019-10-08 DIAGNOSIS — R1012 Left upper quadrant pain: Secondary | ICD-10-CM | POA: Insufficient documentation

## 2019-10-08 DIAGNOSIS — R1110 Vomiting, unspecified: Secondary | ICD-10-CM

## 2019-10-08 DIAGNOSIS — R1084 Generalized abdominal pain: Secondary | ICD-10-CM | POA: Diagnosis present

## 2019-10-08 DIAGNOSIS — Z79899 Other long term (current) drug therapy: Secondary | ICD-10-CM | POA: Diagnosis not present

## 2019-10-08 DIAGNOSIS — R112 Nausea with vomiting, unspecified: Secondary | ICD-10-CM | POA: Diagnosis not present

## 2019-10-08 DIAGNOSIS — E1122 Type 2 diabetes mellitus with diabetic chronic kidney disease: Secondary | ICD-10-CM | POA: Insufficient documentation

## 2019-10-08 DIAGNOSIS — Z87891 Personal history of nicotine dependence: Secondary | ICD-10-CM | POA: Insufficient documentation

## 2019-10-08 DIAGNOSIS — F0390 Unspecified dementia without behavioral disturbance: Secondary | ICD-10-CM | POA: Diagnosis not present

## 2019-10-08 LAB — CBC WITH DIFFERENTIAL/PLATELET
Abs Immature Granulocytes: 0.07 10*3/uL (ref 0.00–0.07)
Basophils Absolute: 0 10*3/uL (ref 0.0–0.1)
Basophils Relative: 0 %
Eosinophils Absolute: 0 10*3/uL (ref 0.0–0.5)
Eosinophils Relative: 0 %
HCT: 46.5 % — ABNORMAL HIGH (ref 36.0–46.0)
Hemoglobin: 15.2 g/dL — ABNORMAL HIGH (ref 12.0–15.0)
Immature Granulocytes: 1 %
Lymphocytes Relative: 9 %
Lymphs Abs: 1.4 10*3/uL (ref 0.7–4.0)
MCH: 32.8 pg (ref 26.0–34.0)
MCHC: 32.7 g/dL (ref 30.0–36.0)
MCV: 100.4 fL — ABNORMAL HIGH (ref 80.0–100.0)
Monocytes Absolute: 1.5 10*3/uL — ABNORMAL HIGH (ref 0.1–1.0)
Monocytes Relative: 9 %
Neutro Abs: 12.4 10*3/uL — ABNORMAL HIGH (ref 1.7–7.7)
Neutrophils Relative %: 81 %
Platelets: 267 10*3/uL (ref 150–400)
RBC: 4.63 MIL/uL (ref 3.87–5.11)
RDW: 12.9 % (ref 11.5–15.5)
WBC: 15.4 10*3/uL — ABNORMAL HIGH (ref 4.0–10.5)
nRBC: 0 % (ref 0.0–0.2)

## 2019-10-08 LAB — COMPREHENSIVE METABOLIC PANEL
ALT: 12 U/L (ref 0–44)
AST: 18 U/L (ref 15–41)
Albumin: 3.6 g/dL (ref 3.5–5.0)
Alkaline Phosphatase: 64 U/L (ref 38–126)
Anion gap: 14 (ref 5–15)
BUN: 23 mg/dL (ref 8–23)
CO2: 22 mmol/L (ref 22–32)
Calcium: 9.4 mg/dL (ref 8.9–10.3)
Chloride: 105 mmol/L (ref 98–111)
Creatinine, Ser: 1.08 mg/dL — ABNORMAL HIGH (ref 0.44–1.00)
GFR calc Af Amer: 56 mL/min — ABNORMAL LOW (ref >60)
GFR calc non Af Amer: 48 mL/min — ABNORMAL LOW (ref >60)
Glucose, Bld: 314 mg/dL — ABNORMAL HIGH (ref 70–99)
Potassium: 4.9 mmol/L (ref 3.5–5.1)
Sodium: 141 mmol/L (ref 135–145)
Total Bilirubin: 1 mg/dL (ref 0.3–1.2)
Total Protein: 6.7 g/dL (ref 6.5–8.1)

## 2019-10-08 LAB — URINALYSIS, ROUTINE W REFLEX MICROSCOPIC
Bacteria, UA: NONE SEEN
Bilirubin Urine: NEGATIVE
Glucose, UA: >=500 mg/dL — AB
Ketones, ur: 20 mg/dL — AB
Leukocytes,Ua: NEGATIVE
Nitrite: NEGATIVE
Protein, ur: 100 mg/dL — AB
Specific Gravity, Urine: 1.023 (ref 1.005–1.030)
pH: 6 (ref 5.0–8.0)

## 2019-10-08 LAB — VALPROIC ACID LEVEL: Valproic Acid Lvl: <10 ug/mL — ABNORMAL LOW (ref 50.0–100.0)

## 2019-10-08 MED ORDER — IOHEXOL 300 MG/ML  SOLN
100.0000 mL | Freq: Once | INTRAMUSCULAR | Status: AC | PRN
  Administered 2019-10-08: 12:00:00 100 mL via INTRAVENOUS

## 2019-10-08 NOTE — Discharge Instructions (Addendum)
Return if any problems.

## 2019-10-08 NOTE — ED Triage Notes (Signed)
Pt bib ems from facility with LUQ/epigastric abd pain with tremors and generalized weakness. Hx hyponatremia, given 1L NS at facility. Currently being treated for UTI.  279 cbg 135/86 NSR  90% room air, placed on 2L [place] at 96%

## 2019-10-08 NOTE — ED Provider Notes (Signed)
[facility] EMERGENCY DEPARTMENT Provider Note   CSN: [id number] Arrival date & time: 10/08/19  [id number]     History No chief complaint on file.   Katie Lozano is a 79 y.o. female.  The history is provided by the patient. No language interpreter was used.  Abdominal Pain Pain location:  Generalized Pain quality: aching   Pain radiates to:  Does not radiate Pain severity:  Moderate Onset quality:  Gradual Duration:  2 days Timing:  Constant Progression:  Worsening Context: not diet changes   Relieved by:  Nothing Worsened by:  Nothing Ineffective treatments:  None tried Associated symptoms: nausea    Pt complains of having the hiccups for 2 days.  Pt has had a recent UTI and a history of hyponatremia.  Pt complains of feeling thirsty.  Pt complains of on going abdominal pain     Past Medical History:  Diagnosis Date  . Allergy   . Anemia associated with acute blood loss 06/2016  . Colon polyp   . Diabetes mellitus without complication ([place])   . Diverticulitis   . DKA (diabetic ketoacidoses) ([place]) 07/11/2016  . Esophageal necrosis   . GERD (gastroesophageal reflux disease)   . Hematemesis 07/11/2016  . Hiatal hernia   . Hyperlipidemia   . Melena 06/2016  . Stroke ([place])   . UGIB (upper gastrointestinal bleed) 06/2016  . Ulcerative esophagitis     Patient Active Problem List   Diagnosis Date Noted  . Seizure ([place]) 05/15/2018  . Cerebral embolism with cerebral infarction 09/16/2016  . Protein-calorie malnutrition, severe 09/15/2016  . Acute encephalopathy 09/14/2016  . 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 Diabetes mellitus ([place]) 09/02/2016  . Anemia 09/02/2016  . 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 NSTEMI (non-ST elevated myocardial infarction) ([place]) 07/22/2016  . Pressure injury of skin 07/17/2016  . UGIB (upper gastrointestinal bleed)   . Essential hypertension 06/06/2016  . CKD (chronic kidney disease), stage III 06/06/2016    . Carotid artery syndrome 06/05/2016  . Type 2 diabetes mellitus with diabetic nephropathy, with long-term current use of insulin ([place]) 03/30/2015  . History of colonic polyps 03/30/2015  . Hyperlipidemia 03/30/2015    Past Surgical History:  Procedure Laterality Date  . APPENDECTOMY  1956  . CHOLECYSTECTOMY  1997  . ESOPHAGOGASTRODUODENOSCOPY N/A 07/14/2016   Procedure: ESOPHAGOGASTRODUODENOSCOPY (EGD);  Surgeon: Manus Gunning, MD;  Location: [facility];  Service: Gastroenterology;  Laterality: N/A;  at bedside  . TONSILLECTOMY     age 35     OB History   No obstetric history on file.     Family History  Problem Relation Age of Onset  . Heart disease Mother   . Melanoma Mother   . Stroke Mother   . Heart disease Father        MI  . Diabetes Father   . Colon cancer Maternal Uncle   . Colon polyps Maternal Uncle     Social History   Tobacco Use  . Smoking status: Former Smoker    Types: Cigarettes    Quit date: 06/29/2012    Years since quitting: 7.2  . Smokeless tobacco: Never Used  Substance Use Topics  . Alcohol use: No    Alcohol/week: 0.0 standard drinks  . Drug use: No    Home Medications Prior to Admission medications   Medication Sig Start Date End Date Taking? Authorizing Provider  acetaminophen (TYLENOL) 325 MG tablet Take 650 mg by mouth every 4 (four) hours as  needed for fever.    [provider]  chlorhexidine (PERIDEX) 0.12 % solution Use as directed 15 mLs in the mouth or throat every 12 (twelve) hours as needed (gingivitis).    [provider]  cholecalciferol (VITAMIN D3) 25 MCG (1000 UT) tablet Take 2,000 Units by mouth at bedtime.    [provider]  clopidogrel (PLAVIX) 75 MG tablet Take 1 tablet (75 mg total) by mouth daily. 09/19/16   Reyne Dumas, MD  divalproex (DEPAKOTE) 250 MG DR tablet Take 1 tablet (250 mg total) by mouth 2 (two) times daily. 05/16/18 05/16/19  Amin, Jeanella Flattery, MD  Emollient  (CERAVE) CREA Apply 1 application topically See admin instructions. Apply topically to face/feet twice daily for dry skin    [provider]  insulin aspart (NOVOLOG) 100 UNIT/ML injection Inject 0-10 Units into the skin See admin instructions. Inject 0-10 units subcutaneously before meals and at bedtime per sliding scale: CBG 0-250 0 units, 251-350 4 units, 351-450 6 units, 451-550 8 units, 551-600 10 units and call provider 07/22/16   [provider]  insulin glargine (LANTUS) 100 UNIT/ML injection Inject 0.35 mLs (35 Units total) into the skin at bedtime. Patient taking differently: Inject 43 Units into the skin daily.  09/18/16   Reyne Dumas, MD  loperamide (IMODIUM A-D) 2 MG tablet Take 2-4 mg by mouth See admin instructions. Take 2 tablets (4 mg) by mouth for initial dose, then take 1 tablet (2 mg) every 6 hours - as needed for diarrhea.    [provider]  Methylcellulose, Laxative, 500 MG TABS Take 500 mg by mouth daily.     [provider]  metoprolol succinate (TOPROL-XL) 25 MG 24 hr tablet Take 1 tablet (25 mg total) by mouth daily. 09/19/16   Reyne Dumas, MD  Multiple Vitamin (MULTIVITAMIN WITH MINERALS) TABS tablet Take 1 tablet by mouth at bedtime.    [provider]  pantoprazole (PROTONIX) 20 MG tablet Take 20 mg by mouth daily.    [provider]  pantoprazole (PROTONIX) 40 MG tablet Take 1 tablet (40 mg total) daily by mouth. Switch for any other PPI at similar dose and frequency Patient not taking: Reported on 05/15/2018 05/05/17   Yetta Flock, MD  potassium chloride SA (K-DUR,KLOR-CON) 20 MEQ tablet Take 1 tablet (20 mEq total) by mouth daily. Patient not taking: Reported on 05/15/2018 09/18/16   Reyne Dumas, MD  simvastatin (ZOCOR) 20 MG tablet Take 20 mg by mouth at bedtime.  02/24/15   [provider]  sitaGLIPtin (JANUVIA) 100 MG tablet Take 100 mg daily by mouth.    [provider]    Allergies     Trulicity [dulaglutide]  Review of Systems   Review of Systems  Unable to perform ROS: Dementia  Gastrointestinal: Positive for abdominal pain and nausea.  All other systems reviewed and are negative.   Physical Exam Updated Vital Signs BP 127/62 (BP Location: Right Arm)   Pulse 88   Temp 98.3 F (36.8 C) (Oral)   Resp 13   Ht 5' (1.524 m)   Wt 56.7 kg   SpO2 96%   BMI 24.41 kg/m   Physical Exam Vitals and nursing note reviewed.  Constitutional:      Appearance: She is well-developed.  HENT:     Head: Normocephalic.     Right Ear: Tympanic membrane normal.     Left Ear: Tympanic membrane normal.  Cardiovascular:     Rate and Rhythm: Normal  rate.  Pulmonary:     Effort: Pulmonary effort is normal.  Abdominal:     General: Abdomen is flat. There is no distension.  Musculoskeletal:        General: Normal range of motion.     Cervical back: Normal range of motion.  Skin:    General: Skin is warm.  Neurological:     General: No focal deficit present.     Mental Status: She is alert and oriented to person, place, and time.  Psychiatric:        Mood and Affect: Mood normal.     ED Results / Procedures / Treatments   Labs (all labs ordered are listed, but only abnormal results are displayed) Labs Reviewed  CBC WITH DIFFERENTIAL/PLATELET - Abnormal; Notable for the following components:      Result Value   WBC 15.4 (*)    Hemoglobin 15.2 (*)    HCT 46.5 (*)    MCV 100.4 (*)    Neutro Abs 12.4 (*)    Monocytes Absolute 1.5 (*)    All other components within normal limits  COMPREHENSIVE METABOLIC PANEL - Abnormal; Notable for the following components:   Glucose, Bld 314 (*)    Creatinine, Ser 1.08 (*)    GFR calc non Af Amer 48 (*)    GFR calc Af Amer 56 (*)    All other components within normal limits  URINALYSIS, ROUTINE W REFLEX MICROSCOPIC - Abnormal; Notable for the following components:   Glucose, UA >=500 (*)    Hgb urine dipstick SMALL (*)     Ketones, ur 20 (*)    Protein, ur 100 (*)    All other components within normal limits  VALPROIC ACID LEVEL - Abnormal; Notable for the following components:   Valproic Acid Lvl <10 (*)    All other components within normal limits  URINE CULTURE    EKG EKG Interpretation  Date/Time:  Saturday October 08 2019 09:30:55 EDT Ventricular Rate:  84 PR Interval:    QRS Duration: 125 QT Interval:  473 QTC Calculation: 560 R Axis:   -45 Text Interpretation: Sinus rhythm Left bundle branch block No significant change since last tracing Confirmed by Isla Pence [phone]) on 10/08/2019 12:00:33 PM   Radiology CT ABDOMEN PELVIS W CONTRAST  Result Date: 10/08/2019 CLINICAL DATA:  Left upper quadrant and epigastric abdominal pain. Generalized weakness. Patient is currently being treated for a UTI. EXAM: CT ABDOMEN AND PELVIS WITH CONTRAST TECHNIQUE: Multidetector CT imaging of the abdomen and pelvis was performed using the standard protocol following bolus administration of intravenous contrast. CONTRAST:  138mL OMNIPAQUE IOHEXOL 300 MG/ML  SOLN COMPARISON:  None. FINDINGS: Lower chest: Limited visualization of the lower thorax demonstrates trace right-sided pleural effusion with bibasilar sub pleural atelectasis, right greater than left. No discrete focal airspace opacities. There are 3 punctate (sub 5 mm) pulmonary nodules are seen within the left costophrenic angle (images 16, 23 and 32, series 5). Normal heart size.  No pericardial effusion. Hepatobiliary: Normal hepatic contour. There is a punctate subcentimeter hypoattenuating lesion within the posterior aspect the right lobe of the liver (image 17, series 3), which is too small to accurately characterize though favored to represent a hepatic cyst. There is a minimal amount of focal fatty infiltration adjacent to the fissure for the ligamentum teres. Post cholecystectomy. No intra or extrahepatic biliary ductal dilatation. No ascites. Pancreas: The  pancreas is diffusely atrophic without ductal dilatation. Spleen: Normal appearance of the spleen. Adrenals/Urinary Tract:  There is symmetric enhancement and excretion of the bilateral kidneys. There is an approximately 1.1 x 0.7 cm nonobstructing stone within the interpolar aspect of the right kidney (axial image 30, series 3; coronal image 63, series 6). There is a punctate (approximately 2 mm) nonobstructing stone within the inferior pole the left kidney (coronal image 50, series 6). No discrete renal lesions. No urine obstruction or perinephric stranding. Normal appearance the bilateral adrenal glands. Normal appearance of the urinary bladder given degree distention. Stomach/Bowel: Rather extensive colonic diverticulosis without evidence of superimposed acute diverticulitis. Normal appearance of the terminal ileum. The appendix is not visualized compatible with provided operative history. Small hiatal hernia. No discrete areas of bowel wall thickening. No pneumoperitoneum, pneumatosis or portal venous gas. Vascular/Lymphatic: Moderate amount of eccentric calcified and noncalcified atherosclerotic plaque within normal caliber abdominal aorta, not resulting in a hemodynamically significant stenosis. Atherosclerotic plaque involves the origin of the major branch vessels of the abdominal aorta though the major branch vessels appear patent on this non CTA examination. 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 The IMA remains patent. No bulky retroperitoneal, mesenteric, pelvic or inguinal lymphadenopathy. Reproductive: Normal appearance of the uterus. Normal appearance of the pelvic organs. No free fluid the pelvic cul-de-sac. Other: There is a minimal amount of subcutaneous edema about the lateral aspects of the inferior abdominal pannus bilaterally, likely at the location of subcutaneous medication administration. Musculoskeletal: No acute or aggressive  osseous abnormalities. Mild to moderate multilevel lumbar spine DDD, worse at L3-L4 with disc space height loss, endplate irregularity and sclerosis. IMPRESSION: 1. No definite explanation for patient's left upper quadrant epigastric abdominal pain. Specifically, no evidence of enteric or urinary obstruction. Post appendectomy and cholecystectomy. 2. Rather extensive colonic diverticulosis without evidence of superimposed acute diverticulitis. 3. Nonobstructing bilateral nephrolithiasis, right greater than left. 4. Punctate (sub 5 mm) pulmonary nodules within the left costophrenic angle. No follow-up needed if patient is low-risk (and has no known or suspected primary neoplasm). Non-contrast chest CT can be considered in 12 months if patient is high-risk. This recommendation follows the consensus statement: Guidelines for Management of Incidental Pulmonary Nodules Detected on CT Images: From the Fleischner Society 2017; Radiology 2017; 284:228-243. 5.  Aortic Atherosclerosis (ICD10-I70.0). Electronically Signed   By: Sandi Mariscal M.D.   On: 10/08/2019 13:11    Procedures Procedures (including critical care time)  Medications Ordered in ED Medications  iohexol (OMNIPAQUE) 300 MG/ML solution 100 mL (100 mLs Intravenous Contrast Given 10/08/19 1229)    ED Course  I have reviewed the triage vital signs and the nursing notes.  Pertinent labs & imaging results that were available during my care of the patient were reviewed by me and considered in my medical decision making (see chart for details).    MDM Rules/Calculators/A&P                      MDM:  Ct scan no acute abnormality,  Glucose elevated with no sign of dka.  Ua no wbc's   Pt able to tolerate fluids without vomiting  Final Clinical Impression(s) / ED Diagnoses Final diagnoses:  Vomiting, intractability of vomiting not specified, presence of nausea not specified, unspecified vomiting type  Hiccup    Rx / DC Orders ED Discharge Orders     None    An After Visit Summary was printed and given to the patient.    Fransico Meadow, [place] 10/08/19 1334  Isla Pence, MD 10/08/19 [phone]

## 2019-10-09 LAB — URINE CULTURE
Culture: <10000 — AB
Special Requests: NORMAL

## 2019-10-16 ENCOUNTER — Emergency Department (HOSPITAL_COMMUNITY): Payer: Medicare (Managed Care)

## 2019-10-16 ENCOUNTER — Inpatient Hospital Stay (HOSPITAL_COMMUNITY)
Admission: EM | Admit: 2019-10-16 | Discharge: 2019-10-18 | DRG: 641 | Disposition: A | Discharge status: Skilled Nursing Facility | Payer: Medicare (Managed Care) | Attending: Internal Medicine | Admitting: Internal Medicine

## 2019-10-16 ENCOUNTER — Other Ambulatory Visit: Payer: Self-pay

## 2019-10-16 DIAGNOSIS — Z833 Family history of diabetes mellitus: Secondary | ICD-10-CM

## 2019-10-16 DIAGNOSIS — R55 Syncope and collapse: Secondary | ICD-10-CM | POA: Diagnosis not present

## 2019-10-16 DIAGNOSIS — I10 Essential (primary) hypertension: Secondary | ICD-10-CM | POA: Diagnosis present

## 2019-10-16 DIAGNOSIS — R531 Weakness: Secondary | ICD-10-CM | POA: Diagnosis not present

## 2019-10-16 DIAGNOSIS — E860 Dehydration: Principal | ICD-10-CM | POA: Diagnosis present

## 2019-10-16 DIAGNOSIS — N1831 Chronic kidney disease, stage 3a: Secondary | ICD-10-CM | POA: Diagnosis not present

## 2019-10-16 DIAGNOSIS — Z20822 Contact with and (suspected) exposure to covid-19: Secondary | ICD-10-CM | POA: Diagnosis present

## 2019-10-16 DIAGNOSIS — E785 Hyperlipidemia, unspecified: Secondary | ICD-10-CM | POA: Diagnosis present

## 2019-10-16 DIAGNOSIS — F0390 Unspecified dementia without behavioral disturbance: Secondary | ICD-10-CM | POA: Diagnosis present

## 2019-10-16 DIAGNOSIS — Z9049 Acquired absence of other specified parts of digestive tract: Secondary | ICD-10-CM

## 2019-10-16 DIAGNOSIS — Z7902 Long term (current) use of antithrombotics/antiplatelets: Secondary | ICD-10-CM

## 2019-10-16 DIAGNOSIS — A0472 Enterocolitis due to Clostridium difficile, not specified as recurrent: Secondary | ICD-10-CM | POA: Diagnosis present

## 2019-10-16 DIAGNOSIS — N183 Chronic kidney disease, stage 3 unspecified: Secondary | ICD-10-CM | POA: Diagnosis present

## 2019-10-16 DIAGNOSIS — Z800 Family history of malignant neoplasm of digestive organs: Secondary | ICD-10-CM

## 2019-10-16 DIAGNOSIS — E872 Acidosis, unspecified: Secondary | ICD-10-CM

## 2019-10-16 DIAGNOSIS — Z808 Family history of malignant neoplasm of other organs or systems: Secondary | ICD-10-CM

## 2019-10-16 DIAGNOSIS — Z8673 Personal history of transient ischemic attack (TIA), and cerebral infarction without residual deficits: Secondary | ICD-10-CM

## 2019-10-16 DIAGNOSIS — E1121 Type 2 diabetes mellitus with diabetic nephropathy: Secondary | ICD-10-CM

## 2019-10-16 DIAGNOSIS — Z8619 Personal history of other infectious and parasitic diseases: Secondary | ICD-10-CM | POA: Diagnosis present

## 2019-10-16 DIAGNOSIS — D631 Anemia in chronic kidney disease: Secondary | ICD-10-CM | POA: Diagnosis present

## 2019-10-16 DIAGNOSIS — E1122 Type 2 diabetes mellitus with diabetic chronic kidney disease: Secondary | ICD-10-CM | POA: Diagnosis present

## 2019-10-16 DIAGNOSIS — Z8249 Family history of ischemic heart disease and other diseases of the circulatory system: Secondary | ICD-10-CM

## 2019-10-16 DIAGNOSIS — Z794 Long term (current) use of insulin: Secondary | ICD-10-CM

## 2019-10-16 DIAGNOSIS — Z8371 Family history of colonic polyps: Secondary | ICD-10-CM

## 2019-10-16 DIAGNOSIS — I129 Hypertensive chronic kidney disease with stage 1 through stage 4 chronic kidney disease, or unspecified chronic kidney disease: Secondary | ICD-10-CM | POA: Diagnosis present

## 2019-10-16 DIAGNOSIS — Z66 Do not resuscitate: Secondary | ICD-10-CM | POA: Diagnosis present

## 2019-10-16 DIAGNOSIS — Z87440 Personal history of urinary (tract) infections: Secondary | ICD-10-CM

## 2019-10-16 DIAGNOSIS — Z823 Family history of stroke: Secondary | ICD-10-CM

## 2019-10-16 DIAGNOSIS — Z79899 Other long term (current) drug therapy: Secondary | ICD-10-CM

## 2019-10-16 LAB — COMPREHENSIVE METABOLIC PANEL
ALT: 13 U/L (ref 0–44)
AST: 26 U/L (ref 15–41)
Albumin: 3.5 g/dL (ref 3.5–5.0)
Alkaline Phosphatase: 81 U/L (ref 38–126)
Anion gap: 15 (ref 5–15)
BUN: 21 mg/dL (ref 8–23)
CO2: 21 mmol/L — ABNORMAL LOW (ref 22–32)
Calcium: 9.8 mg/dL (ref 8.9–10.3)
Chloride: 103 mmol/L (ref 98–111)
Creatinine, Ser: 1.12 mg/dL — ABNORMAL HIGH (ref 0.44–1.00)
GFR calc Af Amer: 54 mL/min — ABNORMAL LOW (ref >60)
GFR calc non Af Amer: 46 mL/min — ABNORMAL LOW (ref >60)
Glucose, Bld: 139 mg/dL — ABNORMAL HIGH (ref 70–99)
Potassium: 3.7 mmol/L (ref 3.5–5.1)
Sodium: 139 mmol/L (ref 135–145)
Total Bilirubin: 1.1 mg/dL (ref 0.3–1.2)
Total Protein: 7.2 g/dL (ref 6.5–8.1)

## 2019-10-16 LAB — CBC
HCT: 47.3 % — ABNORMAL HIGH (ref 36.0–46.0)
Hemoglobin: 15.7 g/dL — ABNORMAL HIGH (ref 12.0–15.0)
MCH: 32.3 pg (ref 26.0–34.0)
MCHC: 33.2 g/dL (ref 30.0–36.0)
MCV: 97.3 fL (ref 80.0–100.0)
Platelets: 248 10*3/uL (ref 150–400)
RBC: 4.86 MIL/uL (ref 3.87–5.11)
RDW: 12.3 % (ref 11.5–15.5)
WBC: 10.1 10*3/uL (ref 4.0–10.5)
nRBC: 0 % (ref 0.0–0.2)

## 2019-10-16 LAB — URINALYSIS, ROUTINE W REFLEX MICROSCOPIC
Bilirubin Urine: NEGATIVE
Glucose, UA: NEGATIVE mg/dL
Hgb urine dipstick: NEGATIVE
Ketones, ur: 20 mg/dL — AB
Leukocytes,Ua: NEGATIVE
Nitrite: NEGATIVE
Protein, ur: NEGATIVE mg/dL
Specific Gravity, Urine: 1.021 (ref 1.005–1.030)
pH: 5 (ref 5.0–8.0)

## 2019-10-16 LAB — LACTIC ACID, PLASMA
Lactic Acid, Venous: 2.6 mmol/L (ref 0.5–1.9)
Lactic Acid, Venous: 2.2 mmol/L (ref 0.5–1.9)
Lactic Acid, Venous: 1.1 mmol/L (ref 0.5–1.9)

## 2019-10-16 LAB — GLUCOSE, CAPILLARY
Glucose-Capillary: 42 mg/dL — CL (ref 70–99)
Glucose-Capillary: 45 mg/dL — ABNORMAL LOW (ref 70–99)
Glucose-Capillary: 179 mg/dL — ABNORMAL HIGH (ref 70–99)

## 2019-10-16 LAB — SARS CORONAVIRUS 2 (TAT 6-24 HRS): SARS Coronavirus 2: NEGATIVE

## 2019-10-16 LAB — PROTIME-INR
INR: 1.1 (ref 0.8–1.2)
Prothrombin Time: 13.8 seconds (ref 11.4–15.2)

## 2019-10-16 LAB — HEMOGLOBIN A1C
Hgb A1c MFr Bld: 7.3 % — ABNORMAL HIGH (ref 4.8–5.6)
Mean Plasma Glucose: 162.81 mg/dL

## 2019-10-16 LAB — CBG MONITORING, ED: Glucose-Capillary: 71 mg/dL (ref 70–99)

## 2019-10-16 LAB — TSH: TSH: 2.071 u[IU]/mL (ref 0.350–4.500)

## 2019-10-16 LAB — CK: Total CK: 36 U/L — ABNORMAL LOW (ref 38–234)

## 2019-10-16 MED ORDER — SODIUM CHLORIDE 0.9% FLUSH
3.0000 mL | Freq: Once | INTRAVENOUS | Status: AC
  Administered 2019-10-16: 3 mL via INTRAVENOUS

## 2019-10-16 MED ORDER — LOPERAMIDE HCL 2 MG PO CAPS: 2.0000 mg | ORAL_CAPSULE | Freq: Four times a day (QID) | ORAL | Status: DC | PRN

## 2019-10-16 MED ORDER — SIMVASTATIN 20 MG PO TABS
20.0000 mg | ORAL_TABLET | Freq: Every day | ORAL | Status: DC
  Administered 2019-10-17: 20 mg via ORAL
  Filled 2019-10-16 (×2): qty 1

## 2019-10-16 MED ORDER — MELATONIN 3 MG PO TABS
6.0000 mg | ORAL_TABLET | Freq: Every day | ORAL | Status: DC
  Administered 2019-10-16 – 2019-10-17 (×2): 6 mg via ORAL
  Filled 2019-10-16 (×2): qty 2

## 2019-10-16 MED ORDER — INSULIN ASPART 100 UNIT/ML ~~LOC~~ SOLN
0.0000 [IU] | Freq: Three times a day (TID) | SUBCUTANEOUS | Status: DC
  Administered 2019-10-17: 1 [IU] via SUBCUTANEOUS
  Administered 2019-10-18: 3 [IU] via SUBCUTANEOUS
  Administered 2019-10-18: 2 [IU] via SUBCUTANEOUS

## 2019-10-16 MED ORDER — GLUCERNA SHAKE PO LIQD
237.0000 mL | Freq: Three times a day (TID) | ORAL | Status: DC
  Administered 2019-10-17: 237 mL via ORAL

## 2019-10-16 MED ORDER — SODIUM CHLORIDE 0.9 % IV BOLUS
500.0000 mL | Freq: Once | INTRAVENOUS | Status: AC
  Administered 2019-10-16: 500 mL via INTRAVENOUS

## 2019-10-16 MED ORDER — INSULIN GLARGINE 100 UNIT/ML ~~LOC~~ SOLN
35.0000 [IU] | Freq: Every day | SUBCUTANEOUS | Status: DC
  Administered 2019-10-17: 35 [IU] via SUBCUTANEOUS
  Filled 2019-10-16 (×2): qty 0.35

## 2019-10-16 MED ORDER — SODIUM CHLORIDE 0.9 % IV BOLUS
1000.0000 mL | Freq: Once | INTRAVENOUS | Status: AC
  Administered 2019-10-16: 1000 mL via INTRAVENOUS

## 2019-10-16 MED ORDER — CLOPIDOGREL BISULFATE 75 MG PO TABS
75.0000 mg | ORAL_TABLET | Freq: Every day | ORAL | Status: DC
  Administered 2019-10-17 – 2019-10-18 (×2): 75 mg via ORAL
  Filled 2019-10-16 (×3): qty 1

## 2019-10-16 MED ORDER — METOPROLOL SUCCINATE ER 25 MG PO TB24
25.0000 mg | ORAL_TABLET | Freq: Every day | ORAL | Status: DC
  Administered 2019-10-17 – 2019-10-18 (×2): 25 mg via ORAL
  Filled 2019-10-16 (×3): qty 1

## 2019-10-16 MED ORDER — POTASSIUM CHLORIDE CRYS ER 20 MEQ PO TBCR
20.0000 meq | EXTENDED_RELEASE_TABLET | Freq: Every day | ORAL | Status: DC
  Administered 2019-10-17 – 2019-10-18 (×2): 20 meq via ORAL
  Filled 2019-10-16 (×3): qty 1

## 2019-10-16 MED ORDER — OLOPATADINE HCL 0.1 % OP SOLN
1.0000 [drp] | Freq: Two times a day (BID) | OPHTHALMIC | Status: DC
  Administered 2019-10-16 – 2019-10-18 (×4): 1 [drp] via OPHTHALMIC
  Filled 2019-10-16: qty 5

## 2019-10-16 MED ORDER — ALUM & MAG HYDROXIDE-SIMETH 200-200-20 MG/5ML PO SUSP: 30.0000 mL | ORAL | Status: DC | PRN

## 2019-10-16 MED ORDER — DEXTROSE 50 % IV SOLN
INTRAVENOUS | Status: AC
  Administered 2019-10-16: 50 mL via INTRAVENOUS
  Filled 2019-10-16: qty 50

## 2019-10-16 MED ORDER — SODIUM CHLORIDE 0.9 % IV SOLN: INTRAVENOUS | Status: DC

## 2019-10-16 NOTE — ED Notes (Signed)
Attempted to call report once; RN currently with another pt and will return my call shortly.

## 2019-10-16 NOTE — ED Notes (Signed)
Pt unable to stand long enough to continue orthostatic vital signs.

## 2019-10-16 NOTE — ED Provider Notes (Signed)
[facility] Provider Note   CSN: [id number] Arrival date & time: 10/16/19  0500     History Chief Complaint  Patient presents with  . Altered Mental Status    BRIANIA Lozano is a 79 y.o. female.  HPI 79 year old female presents with syncope/near syncope.  History is mostly from the sister but also somewhat from the patient.  History is a little limited because the patient does have some mild dementia per the sister.  Patient has been having some falls recently.  Over the last week or so.  Seem to follow-up with her bed recently where the patient describes that her bed kicked her out.  The primary concern was around 4 AM, the patient seemed to pass out on the toilet.  She was unresponsive to staff though the patient states she was seeing stars.  When talking to the patient now, the sister the bedside feels like her mental status is around baseline.  Recently got off isolation from C. difficile and also completed a course of unknown antibiotic for a UTI.  Patient denies dysuria but states her urine looks bloody/tea colored.   Past Medical History:  Diagnosis Date  . Allergy   . Anemia associated with acute blood loss 06/2016  . Colon polyp   . Diabetes mellitus without complication ([place])   . Diverticulitis   . DKA (diabetic ketoacidoses) ([place]) 07/11/2016  . Esophageal necrosis   . GERD (gastroesophageal reflux disease)   . Hematemesis 07/11/2016  . Hiatal hernia   . Hyperlipidemia   . Melena 06/2016  . Stroke ([place])   . UGIB (upper gastrointestinal bleed) 06/2016  . Ulcerative esophagitis     Patient Active Problem List   Diagnosis Date Noted  . Seizure ([place]) 05/15/2018  . Cerebral embolism with cerebral infarction 09/16/2016  . Protein-calorie malnutrition, severe 09/15/2016  . Acute encephalopathy 09/14/2016  . 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 Diabetes mellitus ([place]) 09/02/2016  . Anemia 09/02/2016  . 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 NSTEMI (non-ST elevated myocardial infarction) ([place]) 07/22/2016  . Pressure injury of skin 07/17/2016  . UGIB (upper gastrointestinal bleed)   . Essential hypertension 06/06/2016  . CKD (chronic kidney disease), stage III 06/06/2016  . Carotid artery syndrome 06/05/2016  . Type 2 diabetes mellitus with diabetic nephropathy, with long-term current use of insulin ([place]) 03/30/2015  . History of colonic polyps 03/30/2015  . Hyperlipidemia 03/30/2015    Past Surgical History:  Procedure Laterality Date  . APPENDECTOMY  1956  . CHOLECYSTECTOMY  1997  . ESOPHAGOGASTRODUODENOSCOPY N/A 07/14/2016   Procedure: ESOPHAGOGASTRODUODENOSCOPY (EGD);  Surgeon: Manus Gunning, MD;  Location: [facility];  Service: Gastroenterology;  Laterality: N/A;  at bedside  . TONSILLECTOMY     age 59     OB History   No obstetric history on file.     Family History  Problem Relation Age of Onset  . Heart disease Mother   . Melanoma Mother   . Stroke Mother   . Heart disease Father        MI  . Diabetes Father   . Colon cancer Maternal Uncle   . Colon polyps Maternal Uncle     Social History   Tobacco Use  . Smoking status: Former Smoker    Types: Cigarettes    Quit date: 06/29/2012    Years since quitting: 7.3  . Smokeless tobacco: Never Used  Substance Use Topics  . Alcohol use: No  Alcohol/week: 0.0 standard drinks  . Drug use: No    Home Medications Prior to Admission medications   Medication Sig Start Date End Date Taking? Authorizing Provider  acetaminophen (TYLENOL) 325 MG tablet Take 650 mg by mouth every 4 (four) hours as needed for fever.    [provider]  chlorhexidine (PERIDEX) 0.12 % solution Use as directed 15 mLs in the mouth or throat every 12 (twelve) hours as needed (gingivitis).    [provider]  cholecalciferol (VITAMIN D3) 25 MCG (1000 UT) tablet Take 2,000 Units by mouth at bedtime.    [provider]    clopidogrel (PLAVIX) 75 MG tablet Take 1 tablet (75 mg total) by mouth daily. 09/19/16   Reyne Dumas, MD  divalproex (DEPAKOTE) 250 MG DR tablet Take 1 tablet (250 mg total) by mouth 2 (two) times daily. 05/16/18 05/16/19  Amin, Jeanella Flattery, MD  Emollient (CERAVE) CREA Apply 1 application topically See admin instructions. Apply topically to face/feet twice daily for dry skin    [provider]  insulin aspart (NOVOLOG) 100 UNIT/ML injection Inject 0-10 Units into the skin See admin instructions. Inject 0-10 units subcutaneously before meals and at bedtime per sliding scale: CBG 0-250 0 units, 251-350 4 units, 351-450 6 units, 451-550 8 units, 551-600 10 units and call provider 07/22/16   [provider]  insulin glargine (LANTUS) 100 UNIT/ML injection Inject 0.35 mLs (35 Units total) into the skin at bedtime. Patient taking differently: Inject 43 Units into the skin daily.  09/18/16   Reyne Dumas, MD  loperamide (IMODIUM A-D) 2 MG tablet Take 2-4 mg by mouth See admin instructions. Take 2 tablets (4 mg) by mouth for initial dose, then take 1 tablet (2 mg) every 6 hours - as needed for diarrhea.    [provider]  Methylcellulose, Laxative, 500 MG TABS Take 500 mg by mouth daily.     [provider]  metoprolol succinate (TOPROL-XL) 25 MG 24 hr tablet Take 1 tablet (25 mg total) by mouth daily. 09/19/16   Reyne Dumas, MD  Multiple Vitamin (MULTIVITAMIN WITH MINERALS) TABS tablet Take 1 tablet by mouth at bedtime.    [provider]  pantoprazole (PROTONIX) 20 MG tablet Take 20 mg by mouth daily.    [provider]  pantoprazole (PROTONIX) 40 MG tablet Take 1 tablet (40 mg total) daily by mouth. Switch for any other PPI at similar dose and frequency Patient not taking: Reported on 05/15/2018 05/05/17   Yetta Flock, MD  potassium chloride SA (K-DUR,KLOR-CON) 20 MEQ tablet Take 1 tablet (20 mEq total) by mouth daily. Patient not taking:  Reported on 05/15/2018 09/18/16   Reyne Dumas, MD  simvastatin (ZOCOR) 20 MG tablet Take 20 mg by mouth at bedtime.  02/24/15   [provider]  sitaGLIPtin (JANUVIA) 100 MG tablet Take 100 mg daily by mouth.    [provider]    Allergies    Trulicity [dulaglutide]  Review of Systems   Review of Systems  Constitutional: Negative for fever.  Respiratory: Negative for shortness of breath.   Cardiovascular: Negative for chest pain.  Gastrointestinal: Negative for abdominal pain and vomiting.  Genitourinary: Positive for hematuria. Negative for dysuria.  Neurological: Positive for syncope and light-headedness. Negative for headaches.  All other systems reviewed and are negative.   Physical Exam Updated Vital Signs BP (!) 104/51   Pulse 81   Temp 97.6 F (36.4 C) (Oral)   Resp 16  SpO2 97%   Physical Exam Vitals and nursing note reviewed.  Constitutional:      General: She is not in acute distress.    Appearance: She is well-developed. She is not ill-appearing or diaphoretic.  HENT:     Head: Normocephalic and atraumatic.     Right Ear: External ear normal.     Left Ear: External ear normal.     Nose: Nose normal.  Eyes:     General:        Right eye: No discharge.        Left eye: No discharge.     Extraocular Movements: Extraocular movements intact.     Pupils: Pupils are equal, round, and reactive to light.  Cardiovascular:     Rate and Rhythm: Normal rate and regular rhythm.     Heart sounds: Normal heart sounds.  Pulmonary:     Effort: Pulmonary effort is normal.     Breath sounds: Normal breath sounds.  Abdominal:     Palpations: Abdomen is soft.     Tenderness: There is no abdominal tenderness.  Skin:    General: Skin is warm and dry.  Neurological:     Mental Status: She is alert.     Comments: Alert, oriented to person and place. Knows Barbette Or is president. Thinks it's Feb 2020 (normal orientation per daughter) CN 3-12 grossly intact.  5/5 strength in all 4 extremities. Grossly normal sensation. Normal finger to nose.   Psychiatric:        Mood and Affect: Mood is not anxious.     ED Results / Procedures / Treatments   Labs (all labs ordered are listed, but only abnormal results are displayed) Labs Reviewed  COMPREHENSIVE METABOLIC PANEL - Abnormal; Notable for the following components:      Result Value   CO2 21 (*)    Glucose, Bld 139 (*)    Creatinine, Ser 1.12 (*)    GFR calc non Af Amer 46 (*)    GFR calc Af Amer 54 (*)    All other components within normal limits  CBC - Abnormal; Notable for the following components:   Hemoglobin 15.7 (*)    HCT 47.3 (*)    All other components within normal limits  LACTIC ACID, PLASMA - Abnormal; Notable for the following components:   Lactic Acid, Venous 2.6 (*)    All other components within normal limits  LACTIC ACID, PLASMA - Abnormal; Notable for the following components:   Lactic Acid, Venous 2.2 (*)    All other components within normal limits  URINALYSIS, ROUTINE W REFLEX MICROSCOPIC - Abnormal; Notable for the following components:   Ketones, ur 20 (*)    All other components within normal limits  CK - Abnormal; Notable for the following components:   Total CK 36 (*)    All other components within normal limits  CULTURE, BLOOD (ROUTINE X 2)  CULTURE, BLOOD (ROUTINE X 2)  URINE CULTURE  SARS CORONAVIRUS 2 (TAT 6-24 HRS)  PROTIME-INR    EKG EKG Interpretation  Date/Time:  Sunday October 16 2019 10:01:46 EDT Ventricular Rate:  84 PR Interval:    QRS Duration: 119 QT Interval:  418 QTC Calculation: 495 R Axis:   -27 Text Interpretation: Sinus rhythm Nonspecific intraventricular conduction delay Anterior infarct, old Nonspecific T abnormalities, lateral leads similar to October 08 2019 Confirmed by Sherwood Gambler [phone]) on 10/16/2019 10:29:00 AM   Radiology CT Head Wo Contrast  Result Date: 10/16/2019 CLINICAL DATA:  Altered mental status. EXAM: CT  HEAD WITHOUT CONTRAST TECHNIQUE: Contiguous axial images were obtained from the base of the skull through the vertex without intravenous contrast. COMPARISON:  05/15/2018 FINDINGS: Brain: Stable moderately enlarged ventricles and subarachnoid spaces. Mild-to-moderate patchy white matter low density in both cerebral hemispheres without significant change. Stable old right caudate lacunar infarct. No intracranial hemorrhage, mass lesion or CT evidence of acute infarction. Vascular: No hyperdense vessel or unexpected calcification. Skull: Normal. Negative for fracture or focal lesion. Sinuses/Orbits: Unremarkable. Other: Left temporomandibular joint degenerative changes. IMPRESSION: 1. No acute abnormality. 2. Stable atrophy, chronic small vessel white matter ischemic changes and old right caudate lacunar infarct. 3. Left TMJ degenerative changes. Electronically Signed   By: Claudie Revering M.D.   On: 10/16/2019 10:48   DG Chest Portable 1 View  Result Date: 10/16/2019 CLINICAL DATA:  Weakness. EXAM: PORTABLE CHEST 1 VIEW COMPARISON:  09/14/2016 FINDINGS: Normal sized heart. Clear lungs with normal vascularity. Diffuse osteopenia. Mild scoliosis. Cervical spine degenerative changes. IMPRESSION: No acute abnormality. Electronically Signed   By: Claudie Revering M.D.   On: 10/16/2019 12:13    Procedures Procedures (including critical care time)  Medications Ordered in ED Medications  sodium chloride flush (NS) 0.9 % injection 3 mL (3 mLs Intravenous Given 10/16/19 1051)  sodium chloride 0.9 % bolus 1,000 mL (0 mLs Intravenous Stopped 10/16/19 1418)  sodium chloride 0.9 % bolus 500 mL (500 mLs Intravenous New Bag/Given 10/16/19 1428)    ED Course  I have reviewed the triage vital signs and the nursing notes.  Pertinent labs & imaging results that were available during my care of the patient were reviewed by me and considered in my medical decision making (see chart for details).    MDM Rules/Calculators/A&P                       I think the patient's overall presentation is most consistent with hypovolemia from dehydration and poor p.o. intake.  She does have lactic acidosis.  However no obvious findings to suggest infection.  Urine is clear besides ketones.  Labs reviewed and shows some mild acidosis on her metabolic panel but otherwise normal WBC.  CT head and chest x-ray images reviewed and are overall unremarkable.  Given persistent lactic acidosis, I think she will need admission for further hydration and work-up.  She was unable to stand for more than a couple seconds during orthostatics.  Discussed with Dr. Tamala Julian for admission. Final Clinical Impression(s) / ED Diagnoses Final diagnoses:  Near syncope  Lactic acidosis    Rx / DC Orders ED Discharge Orders    None       Sherwood Gambler, MD 10/16/19 1510

## 2019-10-16 NOTE — H&P (Signed)
History and Physical    Katie Lozano [id number] DOB: [birth year] DOA: 10/16/2019  Referring MD/NP/PA: Sherwood Gambler, MD PCP: Bernerd Limbo, MD  Patient coming from: [facility] [place] [facility]  Chief Complaint: Weakness  I have personally briefly reviewed patient's old medical records in [place]    HPI: Katie Lozano is a 79 y.o. female with medical history significant of CVA, IDDM, HLD, anemia, GI bleed, GERD, and mild dementia presents with complaints of weakness.  Patient provides most of the history although reports that she has issues with her memory.  Recently she has been getting weaker and has not had any energy.  Notes that she had recently had C. difficile and just recently got up precautions after acutely completing antibiotic course.  Then she developed burning with urination and was diagnosed with a urinary tract infection.  Patient was placed on antibiotics, but she reports that her urine has still been a dark red red in color.  This morning patient had been on the toilet when she became unresponsive to them.  Patient reports that she never lost consciousness, but felt very lightheaded as though she could.  She still complains of having diarrhea.  ED Course: Upon admission into the emergency department patient was noted to be afebrile with relatively stable vital signs.  Orthostatic vital signs taken laying and sitting appeared to be negative.  Labs significant for hemoglobin 15.7, BUN 21, creatinine 1.12. Urinalysis was negative for any acute abnormalities.  Patient had been given a total of 1.5 L of normal saline IV fluids.  TRH called to admit.  Review of Systems  Unable to perform ROS: Dementia  Constitutional: Positive for malaise/fatigue.  HENT: Negative for congestion and nosebleeds.   Eyes: Negative for pain.  Respiratory: Negative for shortness of breath.   Cardiovascular: Negative for chest pain and leg swelling.  Gastrointestinal:  Positive for diarrhea.  Genitourinary: Positive for dysuria.  Musculoskeletal: Positive for falls. Negative for myalgias.  Neurological: Positive for weakness. Negative for focal weakness.  Psychiatric/Behavioral: Positive for memory loss. Negative for hallucinations.    Past Medical History:  Diagnosis Date  . Allergy   . Anemia associated with acute blood loss 06/2016  . Colon polyp   . Diabetes mellitus without complication ([place])   . Diverticulitis   . DKA (diabetic ketoacidoses) ([place]) 07/11/2016  . Esophageal necrosis   . GERD (gastroesophageal reflux disease)   . Hematemesis 07/11/2016  . Hiatal hernia   . Hyperlipidemia   . Melena 06/2016  . Stroke ([place])   . UGIB (upper gastrointestinal bleed) 06/2016  . Ulcerative esophagitis     Past Surgical History:  Procedure Laterality Date  . APPENDECTOMY  1956  . CHOLECYSTECTOMY  1997  . ESOPHAGOGASTRODUODENOSCOPY N/A 07/14/2016   Procedure: ESOPHAGOGASTRODUODENOSCOPY (EGD);  Surgeon: Manus Gunning, MD;  Location: [facility];  Service: Gastroenterology;  Laterality: N/A;  at bedside  . TONSILLECTOMY     age 51     reports that she quit smoking about 7 years ago. Her smoking use included cigarettes. She has never used smokeless tobacco. She reports that she does not drink alcohol or use drugs.  Allergies  Allergen Reactions  . Trulicity [Dulaglutide] Diarrhea    Family History  Problem Relation Age of Onset  . Heart disease Mother   . Melanoma Mother   . Stroke Mother   . Heart disease Father        MI  . Diabetes Father   .  Colon cancer Maternal Uncle   . Colon polyps Maternal Uncle     Prior to Admission medications   Medication Sig Start Date End Date Taking? Authorizing Provider  acetaminophen (TYLENOL) 325 MG tablet Take 650 mg by mouth every 4 (four) hours as needed for fever.    [provider]  chlorhexidine (PERIDEX) 0.12 % solution Use as directed 15 mLs in the mouth or throat every  12 (twelve) hours as needed (gingivitis).    [provider]  cholecalciferol (VITAMIN D3) 25 MCG (1000 UT) tablet Take 2,000 Units by mouth at bedtime.    [provider]  clopidogrel (PLAVIX) 75 MG tablet Take 1 tablet (75 mg total) by mouth daily. 09/19/16   Reyne Dumas, MD  divalproex (DEPAKOTE) 250 MG DR tablet Take 1 tablet (250 mg total) by mouth 2 (two) times daily. 05/16/18 05/16/19  Amin, Jeanella Flattery, MD  Emollient (CERAVE) CREA Apply 1 application topically See admin instructions. Apply topically to face/feet twice daily for dry skin    [provider]  insulin aspart (NOVOLOG) 100 UNIT/ML injection Inject 0-10 Units into the skin See admin instructions. Inject 0-10 units subcutaneously before meals and at bedtime per sliding scale: CBG 0-250 0 units, 251-350 4 units, 351-450 6 units, 451-550 8 units, 551-600 10 units and call provider 07/22/16   [provider]  insulin glargine (LANTUS) 100 UNIT/ML injection Inject 0.35 mLs (35 Units total) into the skin at bedtime. Patient taking differently: Inject 43 Units into the skin daily.  09/18/16   Reyne Dumas, MD  loperamide (IMODIUM A-D) 2 MG tablet Take 2-4 mg by mouth See admin instructions. Take 2 tablets (4 mg) by mouth for initial dose, then take 1 tablet (2 mg) every 6 hours - as needed for diarrhea.    [provider]  Methylcellulose, Laxative, 500 MG TABS Take 500 mg by mouth daily.     [provider]  metoprolol succinate (TOPROL-XL) 25 MG 24 hr tablet Take 1 tablet (25 mg total) by mouth daily. 09/19/16   Reyne Dumas, MD  Multiple Vitamin (MULTIVITAMIN WITH MINERALS) TABS tablet Take 1 tablet by mouth at bedtime.    [provider]  pantoprazole (PROTONIX) 20 MG tablet Take 20 mg by mouth daily.    [provider]  pantoprazole (PROTONIX) 40 MG tablet Take 1 tablet (40 mg total) daily by mouth. Switch for any other PPI at similar dose and frequency Patient  not taking: Reported on 05/15/2018 05/05/17   Yetta Flock, MD  potassium chloride SA (K-DUR,KLOR-CON) 20 MEQ tablet Take 1 tablet (20 mEq total) by mouth daily. Patient not taking: Reported on 05/15/2018 09/18/16   Reyne Dumas, MD  simvastatin (ZOCOR) 20 MG tablet Take 20 mg by mouth at bedtime.  02/24/15   [provider]  sitaGLIPtin (JANUVIA) 100 MG tablet Take 100 mg daily by mouth.    [provider]    Physical Exam:  Constitutional: Elderly female who appears to be no acute distress at this time Vitals:   10/16/19 1345 10/16/19 1400 10/16/19 1415 10/16/19 1440  BP: (!) 136/99 109/63 (!) 108/55 (!) 104/51  Pulse: 86 84 84 81  Resp: 18 20 17 16   Temp:      TempSrc:      SpO2: 99% 100% 97% 97%   Eyes: PERRL, lids and conjunctivae normal ENMT: Mucous membranes are moist. Posterior pharynx clear of any exudate or lesions.  Neck: normal, supple, no masses, no thyromegaly Respiratory:  clear to auscultation bilaterally, no wheezing, no crackles. Normal respiratory effort. No accessory muscle use.  Cardiovascular: Regular rate and rhythm, no murmurs / rubs / gallops. No extremity edema. 2+ pedal pulses. No carotid bruits.  Abdomen: no tenderness, no masses palpated. No hepatosplenomegaly. Bowel sounds positive.  Musculoskeletal: no clubbing / cyanosis. No joint deformity upper and lower extremities. Good ROM, no contractures. Normal muscle tone.  Skin: no rashes, lesions, ulcers. No induration Neurologic: CN 2-12 grossly intact.  Able to move all extremities Psychiatric: Decreased recent memory.  Alert and oriented x 3. Normal mood.     Labs on Admission: I have personally reviewed following labs and imaging studies  CBC: Recent Labs  Lab 10/16/19 0528  WBC 10.1  HGB 15.7*  HCT 47.3*  MCV 97.3  PLT [id number]   Basic Metabolic Panel: Recent Labs  Lab 10/16/19 0528  NA 139  K 3.7  CL 103  CO2 21*  GLUCOSE 139*  BUN 21  CREATININE 1.12*  CALCIUM  9.8   GFR: Estimated Creatinine Clearance: 31.6 mL/min (A) (by C-G formula based on SCr of 1.12 mg/dL (H)). Liver Function Tests: Recent Labs  Lab 10/16/19 0528  AST 26  ALT 13  ALKPHOS 81  BILITOT 1.1  PROT 7.2  ALBUMIN 3.5   No results for input(s): LIPASE, AMYLASE in the last 168 hours. No results for input(s): AMMONIA in the last 168 hours. Coagulation Profile: Recent Labs  Lab 10/16/19 0528  INR 1.1   Cardiac Enzymes: Recent Labs  Lab 10/16/19 0528  CKTOTAL 36*   BNP (last 3 results) No results for input(s): PROBNP in the last 8760 hours. HbA1C: No results for input(s): HGBA1C in the last 72 hours. CBG: No results for input(s): GLUCAP in the last 168 hours. Lipid Profile: No results for input(s): CHOL, HDL, LDLCALC, TRIG, CHOLHDL, LDLDIRECT in the last 72 hours. Thyroid Function Tests: No results for input(s): TSH, T4TOTAL, FREET4, T3FREE, THYROIDAB in the last 72 hours. Anemia Panel: No results for input(s): VITAMINB12, FOLATE, FERRITIN, TIBC, IRON, RETICCTPCT in the last 72 hours. Urine analysis:    Component Value Date/Time   COLORURINE YELLOW 10/08/2019 [place] 10/08/2019 1015   LABSPEC 1.023 10/08/2019 1015   PHURINE 6.0 10/08/2019 1015   GLUCOSEU >=500 (A) 10/08/2019 1015   HGBUR SMALL (A) 10/08/2019 1015   BILIRUBINUR NEGATIVE 10/08/2019 1015   KETONESUR 20 (A) 10/08/2019 1015   PROTEINUR 100 (A) 10/08/2019 1015   NITRITE NEGATIVE 10/08/2019 1015   LEUKOCYTESUR NEGATIVE 10/08/2019 1015   Sepsis Labs: Recent Results (from the past 240 hour(s))  Urine Culture     Status: Abnormal   Collection Time: 10/08/19 10:12 AM   Specimen: Urine, Clean Catch  Result Value Ref Range Status   Specimen Description URINE, CLEAN CATCH  Final   Special Requests Normal  Final   Culture (A)  Final    <10,000 COLONIES/mL INSIGNIFICANT GROWTH Performed at [facility], [address] [place], [place] [address]    Report Status  10/09/2019 FINAL  Final     Radiological Exams on Admission: CT Head Wo Contrast  Result Date: 10/16/2019 CLINICAL DATA:  Altered mental status. EXAM: CT HEAD WITHOUT CONTRAST TECHNIQUE: Contiguous axial images were obtained from the base of the skull through the vertex without intravenous contrast. COMPARISON:  05/15/2018 FINDINGS: Brain: Stable moderately enlarged ventricles and subarachnoid spaces. Mild-to-moderate patchy white matter low density in both cerebral hemispheres without significant change. Stable old right caudate lacunar  infarct. No intracranial hemorrhage, mass lesion or CT evidence of acute infarction. Vascular: No hyperdense vessel or unexpected calcification. Skull: Normal. Negative for fracture or focal lesion. Sinuses/Orbits: Unremarkable. Other: Left temporomandibular joint degenerative changes. IMPRESSION: 1. No acute abnormality. 2. Stable atrophy, chronic small vessel white matter ischemic changes and old right caudate lacunar infarct. 3. Left TMJ degenerative changes. Electronically Signed   By: Claudie Revering M.D.   On: 10/16/2019 10:48   DG Chest Portable 1 View  Result Date: 10/16/2019 CLINICAL DATA:  Weakness. EXAM: PORTABLE CHEST 1 VIEW COMPARISON:  09/14/2016 FINDINGS: Normal sized heart. Clear lungs with normal vascularity. Diffuse osteopenia. Mild scoliosis. Cervical spine degenerative changes. IMPRESSION: No acute abnormality. Electronically Signed   By: Claudie Revering M.D.   On: 10/16/2019 12:13    EKG: Independently reviewed.  Sinus rhythm 84 bpm with QTc 495.  Assessment/Plan Weakness and near syncope secondary to dehydration: Patient presents after reportedly having worsening weakness with possible syncope/near syncope episode.  Patient reports that she had just gotten over C. difficile and then a urinary tract infection.  Labs significant for concentrated hemoglobin and normal CK.  Although not orthostatic patient suspected to be dehydrated.  Patient had been  given 1.5 L of normal saline IV fluids in ED. -Admit to a medical telemetry bed -Check TSH -Normal saline IV fluids 75 mL/h overnight -PT/OT to eval in a.m.  Lactic acidosis: Acute.  Initial lactic acid elevated 2.6-> 2.2 after IV fluids. -Continue to monitor lactic acid levels   IDDM: On admission blood glucose 139.  Last hemoglobin A1c 7.3 on 10/16/2019. -Hypoglycemic protocol -CBGs before every meal with sensitive SSI -Restart home Lantus dose tomorrow night as has been n.p.o. most of the day  Recent C. difficile and urinary tract infection: Patient reportedly completed a course of antibiotics and was was off all contact precautions.  Patient still reports having diarrhea, but urinalysis negative for any signs of infection.. -Monitor intake and output  Essential hypertension: Home medications include metoprolol 25 mg daily. -Continue metoprolol  Chronic kidney disease stage III: Creatinine 1.12 on admission which appears near baseline. -Recheck creatinine in a.m.  Dementia: Patient has mild dementia.  Hyperlipidemia: Home medications include simvastatin 20 mg. -Continue simvastatin  Prior history of stroke -Continue Plavix  DNR present on admission   DVT prophylaxis: Lovenox Code Status: DNR Family Communication: Family currently present at bedside Disposition Plan: Possible discharge back to nursing facility once medically stable Consults called: PT/OT Admission status: Observation  Norval Morton MD [facility] Pager [phone]   If 7PM-7AM, please contact night-coverage [email or web address] Password [facility]  10/16/2019, 2:59 PM

## 2019-10-16 NOTE — ED Triage Notes (Signed)
Per EMS, pt from [facility] in [place], [place] after staff "assisted her to the floor in the bathroom."  Its reported that pt is at baseline A/O.  Pt is currently being treated for a UTI.    80/60 70HR 100% on 2L CBG 144

## 2019-10-16 NOTE — ED Notes (Signed)
Sister # [phone] Katie Lozano Pt comes from [facility]

## 2019-10-17 DIAGNOSIS — Z8673 Personal history of transient ischemic attack (TIA), and cerebral infarction without residual deficits: Secondary | ICD-10-CM | POA: Diagnosis not present

## 2019-10-17 DIAGNOSIS — Z87440 Personal history of urinary (tract) infections: Secondary | ICD-10-CM | POA: Diagnosis not present

## 2019-10-17 DIAGNOSIS — E872 Acidosis: Secondary | ICD-10-CM | POA: Diagnosis present

## 2019-10-17 DIAGNOSIS — Z66 Do not resuscitate: Secondary | ICD-10-CM | POA: Diagnosis present

## 2019-10-17 DIAGNOSIS — I129 Hypertensive chronic kidney disease with stage 1 through stage 4 chronic kidney disease, or unspecified chronic kidney disease: Secondary | ICD-10-CM | POA: Diagnosis present

## 2019-10-17 DIAGNOSIS — Z8371 Family history of colonic polyps: Secondary | ICD-10-CM | POA: Diagnosis not present

## 2019-10-17 DIAGNOSIS — Z808 Family history of malignant neoplasm of other organs or systems: Secondary | ICD-10-CM | POA: Diagnosis not present

## 2019-10-17 DIAGNOSIS — D631 Anemia in chronic kidney disease: Secondary | ICD-10-CM | POA: Diagnosis present

## 2019-10-17 DIAGNOSIS — Z794 Long term (current) use of insulin: Secondary | ICD-10-CM | POA: Diagnosis not present

## 2019-10-17 DIAGNOSIS — Z7902 Long term (current) use of antithrombotics/antiplatelets: Secondary | ICD-10-CM | POA: Diagnosis not present

## 2019-10-17 DIAGNOSIS — E785 Hyperlipidemia, unspecified: Secondary | ICD-10-CM | POA: Diagnosis present

## 2019-10-17 DIAGNOSIS — Z79899 Other long term (current) drug therapy: Secondary | ICD-10-CM | POA: Diagnosis not present

## 2019-10-17 DIAGNOSIS — E860 Dehydration: Secondary | ICD-10-CM | POA: Diagnosis present

## 2019-10-17 DIAGNOSIS — A0472 Enterocolitis due to Clostridium difficile, not specified as recurrent: Secondary | ICD-10-CM | POA: Diagnosis present

## 2019-10-17 DIAGNOSIS — Z8249 Family history of ischemic heart disease and other diseases of the circulatory system: Secondary | ICD-10-CM | POA: Diagnosis not present

## 2019-10-17 DIAGNOSIS — Z823 Family history of stroke: Secondary | ICD-10-CM | POA: Diagnosis not present

## 2019-10-17 DIAGNOSIS — Z800 Family history of malignant neoplasm of digestive organs: Secondary | ICD-10-CM | POA: Diagnosis not present

## 2019-10-17 DIAGNOSIS — F0390 Unspecified dementia without behavioral disturbance: Secondary | ICD-10-CM | POA: Diagnosis present

## 2019-10-17 DIAGNOSIS — E1122 Type 2 diabetes mellitus with diabetic chronic kidney disease: Secondary | ICD-10-CM | POA: Diagnosis present

## 2019-10-17 DIAGNOSIS — R531 Weakness: Secondary | ICD-10-CM | POA: Diagnosis not present

## 2019-10-17 DIAGNOSIS — Z20822 Contact with and (suspected) exposure to covid-19: Secondary | ICD-10-CM | POA: Diagnosis present

## 2019-10-17 DIAGNOSIS — R55 Syncope and collapse: Secondary | ICD-10-CM | POA: Diagnosis present

## 2019-10-17 DIAGNOSIS — Z9049 Acquired absence of other specified parts of digestive tract: Secondary | ICD-10-CM | POA: Diagnosis not present

## 2019-10-17 DIAGNOSIS — Z833 Family history of diabetes mellitus: Secondary | ICD-10-CM | POA: Diagnosis not present

## 2019-10-17 DIAGNOSIS — N1831 Chronic kidney disease, stage 3a: Secondary | ICD-10-CM | POA: Diagnosis present

## 2019-10-17 LAB — URINE CULTURE: Culture: NO GROWTH

## 2019-10-17 LAB — GLUCOSE, CAPILLARY
Glucose-Capillary: 124 mg/dL — ABNORMAL HIGH (ref 70–99)
Glucose-Capillary: 126 mg/dL — ABNORMAL HIGH (ref 70–99)
Glucose-Capillary: 186 mg/dL — ABNORMAL HIGH (ref 70–99)
Glucose-Capillary: 229 mg/dL — ABNORMAL HIGH (ref 70–99)
Glucose-Capillary: 74 mg/dL (ref 70–99)

## 2019-10-17 LAB — C DIFFICILE QUICK SCREEN W PCR REFLEX
C Diff antigen: POSITIVE — AB
C Diff toxin: NEGATIVE

## 2019-10-17 LAB — BASIC METABOLIC PANEL
Anion gap: 11 (ref 5–15)
BUN: 12 mg/dL (ref 8–23)
CO2: 17 mmol/L — ABNORMAL LOW (ref 22–32)
Calcium: 8.3 mg/dL — ABNORMAL LOW (ref 8.9–10.3)
Chloride: 110 mmol/L (ref 98–111)
Creatinine, Ser: 0.87 mg/dL (ref 0.44–1.00)
GFR calc Af Amer: >60 mL/min (ref >60)
GFR calc non Af Amer: >60 mL/min (ref >60)
Glucose, Bld: 144 mg/dL — ABNORMAL HIGH (ref 70–99)
Potassium: 3.7 mmol/L (ref 3.5–5.1)
Sodium: 138 mmol/L (ref 135–145)

## 2019-10-17 LAB — CLOSTRIDIUM DIFFICILE BY PCR, REFLEXED: Toxigenic C. Difficile by PCR: NEGATIVE

## 2019-10-17 MED ORDER — DEXTROSE 50 % IV SOLN: 25.0000 g | INTRAVENOUS | Status: AC

## 2019-10-17 NOTE — Progress Notes (Signed)
PROGRESS NOTE    Katie Lozano  [id number] DOB: [birth year] DOA: 10/16/2019 PCP: Bernerd Limbo, MD   Brief Narrative: Patient is 79 year old female with history of CVA, insulin-dependent diabetes mellitus, hyperlipidemia, anemia, GI bleed, GERD, mild dementia who presented from nursing facility with complaints of generalized weakness.  She recently had C. difficile and completed antibiotic course.  She also complained of burning urination.  She became unresponsive at the  nursing facility.  Still complaining of diarrhea.  On presentation she was hemodynamically stable.  Admitted for management of generalized weakness and poor oral intake.  Started on IV fluids.  Assessment & Plan:   Principal Problem:   Generalized weakness Active Problems:   Type 2 diabetes mellitus with diabetic nephropathy, with long-term current use of insulin (HCC)   Hyperlipidemia   Essential hypertension   CKD (chronic kidney disease), stage III   Near syncope   History of stroke   Dehydration   History of Clostridioides difficile colitis   History of UTI   Weakness/near syncope/dehydration: Most likely secondary to diarrhea from C. difficile.  Not orthostatic on presentation.  Dehydrated.  Started on IV fluids. PT/OT evaluation done.  PT recommended home health but OT recommending skilled facility.  TOC consulted.  Lactic acidosis: Most likely secondary to dehydration.  Elevated lactate on presentation.  Resolved.  Insulin-dependent diabetes mellitus: Hemoglobin A1c 7.3. Started home dose Lantus.  On sliding scale also.  Recent history of C. difficile/urinary tract infection: She has completed antibiotics course.  Patient still having diarrhea.  Urinalysis negative for UTI.  Denies any abdominal pain.  Will defer retesting for C. Difficile.  Hypertension: Continue metoprolol  CKD stage IIIa: Kidney function near baseline.  Dementia: Has mild dementia but currently alert and oriented.  Continue  supportive care.  History of stroke: Continue Plavix          DVT prophylaxis:Lovenox Code Status: DNR Family Communication: None Status is: Inpatient  Remains inpatient appropriate because:IV treatments appropriate due to intensity of illness or inability to take PO   Dispo: The patient is from: SNF              Anticipated d/c is to: SNF              Anticipated d/c date is: 1 day              Patient currently is not medically stable to d/c.  Needs to continue IV fluids today.  Still having diarrhea         Consultants: None  Procedures: None  Antimicrobials:  Anti-infectives (From admission, onward)   None      Subjective: Patient seen and examined at the bedside this afternoon. Denies  any abdominal pain but she still complains of weakness.  Just had an episode of diarrhea before my evaluation.  Objective: Vitals:   10/17/19 0820 10/17/19 1158 10/17/19 1200 10/17/19 1320  BP: (!) 103/58 (!) 122/59 (!) 122/59 (!) 116/56  Pulse: 99 80 80 77  Resp: 20  (!) 22 16  Temp:    98.2 F (36.8 C)  TempSrc:    Oral  SpO2: 95%  96% 96%  Weight:      Height:        Intake/Output Summary (Last 24 hours) at 10/17/2019 1438 Last data filed at 10/17/2019 0700 Gross per 24 hour  Intake 1851.25 ml  Output 100 ml  Net 1751.25 ml   Filed Weights   10/16/19 2012 10/17/19 0415  Weight: 54.1  kg 54.4 kg    Examination:  General exam: Not in distress Respiratory system: Bilateral equal air entry, normal vesicular breath sounds, no wheezes or crackles  Cardiovascular system: S1 & S2 heard, RRR. No JVD, murmurs, rubs, gallops or clicks. No pedal edema. Gastrointestinal system: Abdomen is nondistended, soft and nontender. No organomegaly or masses felt. Normal bowel sounds heard. Central nervous system: Alert and oriented. No focal neurological deficits. Extremities: No edema, no clubbing ,no cyanosis Skin: No rashes, lesions or ulcers,no icterus ,no  pallor    Data Reviewed: I have personally reviewed following labs and imaging studies  CBC: Recent Labs  Lab 10/16/19 0528  WBC 10.1  HGB 15.7*  HCT 47.3*  MCV 97.3  PLT Q000111Q   Basic Metabolic Panel: Recent Labs  Lab 10/16/19 0528 10/17/19 0805  NA 139 138  K 3.7 3.7  CL 103 110  CO2 21* 17*  GLUCOSE 139* 144*  BUN 21 12  CREATININE 1.12* 0.87  CALCIUM 9.8 8.3*   GFR: Estimated Creatinine Clearance: 44.3 mL/min (by C-G formula based on SCr of 0.87 mg/dL). Liver Function Tests: Recent Labs  Lab 10/16/19 0528  AST 26  ALT 13  ALKPHOS 81  BILITOT 1.1  PROT 7.2  ALBUMIN 3.5   No results for input(s): LIPASE, AMYLASE in the last 168 hours. No results for input(s): AMMONIA in the last 168 hours. Coagulation Profile: Recent Labs  Lab 10/16/19 0528  INR 1.1   Cardiac Enzymes: Recent Labs  Lab 10/16/19 0528  CKTOTAL 36*   BNP (last 3 results) No results for input(s): PROBNP in the last 8760 hours. HbA1C: Recent Labs    10/16/19 0528  HGBA1C 7.3*   CBG: Recent Labs  Lab 10/16/19 2057 10/16/19 2133 10/17/19 0028 10/17/19 0802 10/17/19 1317  GLUCAP 45* 179* 229* 126* 74   Lipid Profile: No results for input(s): CHOL, HDL, LDLCALC, TRIG, CHOLHDL, LDLDIRECT in the last 72 hours. Thyroid Function Tests: Recent Labs    10/16/19 1049  TSH 2.071   Anemia Panel: No results for input(s): VITAMINB12, FOLATE, FERRITIN, TIBC, IRON, RETICCTPCT in the last 72 hours. Sepsis Labs: Recent Labs  Lab 10/16/19 1049 10/16/19 1325 10/16/19 2230  LATICACIDVEN 2.6* 2.2* 1.1    Recent Results (from the past 240 hour(s))  Urine Culture     Status: Abnormal   Collection Time: 10/08/19 10:12 AM   Specimen: Urine, Clean Catch  Result Value Ref Range Status   Specimen Description URINE, CLEAN CATCH  Final   Special Requests Normal  Final   Culture (A)  Final    <10,000 COLONIES/mL INSIGNIFICANT GROWTH Performed at [facility], [place] [address],  [place], [place] [address]    Report Status 10/09/2019 FINAL  Final  Culture, blood (routine x 2)     Status: None (Preliminary result)   Collection Time: 10/16/19 10:44 AM   Specimen: BLOOD RIGHT FOREARM  Result Value Ref Range Status   Specimen Description BLOOD RIGHT FOREARM  Final   Special Requests   Final    BOTTLES DRAWN AEROBIC AND ANAEROBIC Blood Culture results may not be optimal due to an inadequate volume of blood received in culture bottles   Culture   Final    NO GROWTH 1 DAY Performed at [facility], [place] [address], [place], [place] [address]    Report Status PENDING  Incomplete  Culture, blood (routine x 2)     Status: None (Preliminary result)   Collection Time: 10/16/19 11:04  AM   Specimen: BLOOD RIGHT HAND  Result Value Ref Range Status   Specimen Description BLOOD RIGHT HAND  Final   Special Requests   Final    BOTTLES DRAWN AEROBIC ONLY Blood Culture results may not be optimal due to an inadequate volume of blood received in culture bottles   Culture   Final    NO GROWTH 1 DAY Performed at [facility] [address], [place], [place] [address]    Report Status PENDING  Incomplete  SARS CORONAVIRUS 2 (TAT 6-24 HRS) Nasopharyngeal Nasopharyngeal Swab     Status: None   Collection Time: 10/16/19  2:17 PM   Specimen: Nasopharyngeal Swab  Result Value Ref Range Status   SARS Coronavirus 2 NEGATIVE NEGATIVE Final    Comment: (NOTE) SARS-CoV-2 target nucleic acids are NOT DETECTED. The SARS-CoV-2 RNA is generally detectable in upper and lower respiratory specimens during the acute phase of infection. Negative results do not preclude SARS-CoV-2 infection, do not rule out co-infections with other pathogens, and should not be used as the sole basis for treatment or other patient management decisions. Negative results must be combined with clinical observations, patient history, and epidemiological information. The expected result is Negative. Fact Sheet  for Patients: SugarRoll.be Fact Sheet for Healthcare Providers: https://www.woods-mathews.com/ This test is not yet approved or cleared by the [place] FDA and  has been authorized for detection and/or diagnosis of SARS-CoV-2 by FDA under an Emergency Use Authorization (EUA). This EUA will remain  in effect (meaning this test can be used) for the duration of the COVID-19 declaration under Section 56 4(b)(1) of the Act, 21 U.S.C. section 360bbb-3(b)(1), unless the authorization is terminated or revoked sooner. Performed at [facility], [place] [address] [place], [place] [address]   Urine culture     Status: None   Collection Time: 10/16/19  2:47 PM   Specimen: Urine, Random  Result Value Ref Range Status   Specimen Description URINE, RANDOM  Final   Special Requests NONE  Final   Culture   Final    NO GROWTH Performed at [facility], [place] [address] [place], [place] [address]    Report Status 10/17/2019 FINAL  Final         Radiology Studies: CT Head Wo Contrast  Result Date: 10/16/2019 CLINICAL DATA:  Altered mental status. EXAM: CT HEAD WITHOUT CONTRAST TECHNIQUE: Contiguous axial images were obtained from the base of the skull through the vertex without intravenous contrast. COMPARISON:  05/15/2018 FINDINGS: Brain: Stable moderately enlarged ventricles and subarachnoid spaces. Mild-to-moderate patchy white matter low density in both cerebral hemispheres without significant change. Stable old right caudate lacunar infarct. No intracranial hemorrhage, mass lesion or CT evidence of acute infarction. Vascular: No hyperdense vessel or unexpected calcification. Skull: Normal. Negative for fracture or focal lesion. Sinuses/Orbits: Unremarkable. Other: Left temporomandibular joint degenerative changes. IMPRESSION: 1. No acute abnormality. 2. Stable atrophy, chronic small vessel white matter ischemic changes and old right caudate  lacunar infarct. 3. Left TMJ degenerative changes. Electronically Signed   By: Claudie Revering M.D.   On: 10/16/2019 10:48   DG Chest Portable 1 View  Result Date: 10/16/2019 CLINICAL DATA:  Weakness. EXAM: PORTABLE CHEST 1 VIEW COMPARISON:  09/14/2016 FINDINGS: Normal sized heart. Clear lungs with normal vascularity. Diffuse osteopenia. Mild scoliosis. Cervical spine degenerative changes. IMPRESSION: No acute abnormality. Electronically Signed   By: Claudie Revering M.D.   On: 10/16/2019 12:13  Scheduled Meds: . clopidogrel  75 mg Oral Daily  . feeding supplement (GLUCERNA SHAKE)  237 mL Oral TID BM  . insulin aspart  0-9 Units Subcutaneous TID WC  . insulin glargine  35 Units Subcutaneous QHS  . melatonin  6 mg Oral QHS  . metoprolol succinate  25 mg Oral Daily  . olopatadine  1 drop Both Eyes BID  . potassium chloride SA  20 mEq Oral Daily  . simvastatin  20 mg Oral QHS   Continuous Infusions: . sodium chloride 75 mL/hr at 10/17/19 0700     LOS: 0 days    Time spent:25 mins. More than 50% of that time was spent in counseling and/or coordination of care.      Shelly Coss, MD [facility] P4/19/2021, 2:38 PM

## 2019-10-17 NOTE — Evaluation (Signed)
Physical Therapy Evaluation Patient Details Name: Katie Lozano MRN: [id number] DOB: [birth year] Today's Date: 10/17/2019   History of Present Illness  Pt is a 79 y/o female with PMH of CVA, IDDM, HLD, anemia, GI bleed, GERD, and mild dementia presents with complaints of weakness and near syncope due to dehydration.   Clinical Impression  Patient received in bed resting. She is cooperative and agrees to PT assessment. She reports feeling a little better. Patient requires supervision for bed mobility this morning. Min assist for sit to stand from bed. Min guard for ambulation of 125' using RW. She was fatigued at end of ambulation and went back to bed and back to sleep quickly. She will continue to benefit from skilled PT while here to improve strength and activity tolerance.      Follow Up Recommendations Home health PT    Equipment Recommendations  None recommended by PT    Recommendations for Other Services       Precautions / Restrictions Precautions Precautions: Fall Restrictions Weight Bearing Restrictions: No      Mobility  Bed Mobility Overal bed mobility: Needs Assistance Bed Mobility: Supine to Sit;Sit to Supine     Supine to sit: Supervision Sit to supine: Min guard   General bed mobility comments: no physical assistance provided  Transfers Overall transfer level: Needs assistance Equipment used: Rolling walker (2 wheeled) Transfers: Sit to/from Stand Sit to Stand: Min assist         General transfer comment: min assist to power up and steady with cueing for hand placement and safety  Ambulation/Gait Ambulation/Gait assistance: Min guard Gait Distance (Feet): 125 Feet Assistive device: Rolling walker (2 wheeled) Gait Pattern/deviations: Step-through pattern;Decreased stride length;Decreased dorsiflexion - right;Decreased dorsiflexion - left Gait velocity: decreased   General Gait Details: decreased foot clearance bilaterally, fatigued with gait, but  no unsteadiness noted.  Stairs            Wheelchair Mobility    Modified Rankin (Stroke Patients Only)       Balance Overall balance assessment: Mild deficits observed, not formally tested;Needs assistance Sitting-balance support: Feet supported Sitting balance-Leahy Scale: Fair     Standing balance support: Bilateral upper extremity supported;During functional activity Standing balance-Leahy Scale: Fair Standing balance comment: relaint on BUE support                             Pertinent Vitals/Pain Pain Assessment: No/denies pain    Home Living Family/patient expects to be discharged to:: Skilled nursing facility                      Prior Function Level of Independence: Independent with assistive device(s)         Comments: pt reports ambulating with RW for longer distances, independent basic ADLs      Hand Dominance   Dominant Hand: Right    Extremity/Trunk Assessment   Upper Extremity Assessment Upper Extremity Assessment: Generalized weakness    Lower Extremity Assessment Lower Extremity Assessment: Generalized weakness    Cervical / Trunk Assessment Cervical / Trunk Assessment: Normal  Communication   Communication: No difficulties  Cognition Arousal/Alertness: Awake/alert Behavior During Therapy: WFL for tasks assessed/performed Overall Cognitive Status: History of cognitive impairments - at baseline                                 General  Comments: hx of dementia, oriented and aware of situation, follows commands and pleasant       General Comments General comments (skin integrity, edema, etc.): VSS on RA     Exercises     Assessment/Plan    PT Assessment Patient needs continued PT services  PT Problem List Decreased strength;Decreased mobility;Decreased activity tolerance       PT Treatment Interventions Gait training;Therapeutic activities;Functional mobility training;Patient/family  education;Therapeutic exercise    PT Goals (Current goals can be found in the Care Plan section)  Acute Rehab PT Goals Patient Stated Goal: to get stronger  PT Goal Formulation: With patient Time For Goal Achievement: 10/28/19 Potential to Achieve Goals: Good    Frequency Min 2X/week   Barriers to discharge        Co-evaluation               AM-PAC PT "6 Clicks" Mobility  Outcome Measure Help needed turning from your back to your side while in a flat bed without using bedrails?: None Help needed moving from lying on your back to sitting on the side of a flat bed without using bedrails?: A Little Help needed moving to and from a bed to a chair (including a wheelchair)?: A Little Help needed standing up from a chair using your arms (e.g., wheelchair or bedside chair)?: A Little Help needed to walk in hospital room?: A Little Help needed climbing 3-5 steps with a railing? : A Little 6 Click Score: 19    End of Session Equipment Utilized During Treatment: Gait belt Activity Tolerance: Patient tolerated treatment well;Patient limited by fatigue Patient left: in bed;with bed alarm set;with call bell/phone within reach Nurse Communication: Mobility status PT Visit Diagnosis: Muscle weakness (generalized) (M62.81)    Time: 1015-1040 PT Time Calculation (min) (ACUTE ONLY): 25 min   Charges:   PT Evaluation $PT Eval Moderate Complexity: 1 Mod PT Treatments $Gait Training: 8-22 mins        [employer], PT, [facility] 10/17/19,11:02 AM

## 2019-10-17 NOTE — Evaluation (Signed)
Occupational Therapy Evaluation Patient Details Name: Katie Lozano MRN: [id number] DOB: 03-Sep-1940 Today's Date: 10/17/2019    History of Present Illness Pt is a 79 y/o female with PMH of CVA, IDDM, HLD, anemia, GI bleed, GERD, and mild dementia presents with complaints of weakness and near syncope due to dehydration.    Clinical Impression   PTA patient reports independent with mobility (using RW for long distances), completing basic ADLs without assist; living at [facility]. Admitted for above and limited by decreased activity tolerance, impaired balance, and generalized weakness (with noted dementia at baseline). Currently completing bed mobility with min assist, transfers with min assist using RW, min guard for mobility in room, and min-mod assist for ADLs. She will benefit from further OT services while admitted and after dc at SNF level to optimize independence and return to PLOF with ADLs, mobility.     Follow Up Recommendations  SNF;Supervision/Assistance - 24 hour    Equipment Recommendations  3 in 1 bedside commode    Recommendations for Other Services       Precautions / Restrictions Precautions Precautions: Fall Restrictions Weight Bearing Restrictions: No      Mobility Bed Mobility Overal bed mobility: Needs Assistance Bed Mobility: Supine to Sit;Sit to Supine     Supine to sit: Min assist Sit to supine: Min guard   General bed mobility comments: min assist to ascend trunk, increased time to scoot foward; returned to supine with min guard   Transfers Overall transfer level: Needs assistance Equipment used: Rolling walker (2 wheeled) Transfers: Sit to/from Stand Sit to Stand: Min assist         General transfer comment: min assist to power up and steady with cueing for hand placement and safety    Balance Overall balance assessment: Needs assistance Sitting-balance support: No upper extremity supported;Feet supported Sitting balance-Leahy Scale: Fair      Standing balance support: Bilateral upper extremity supported;During functional activity Standing balance-Leahy Scale: Poor Standing balance comment: relaint on BUE support                           ADL either performed or assessed with clinical judgement   ADL Overall ADL's : Needs assistance/impaired     Grooming: Set up;Sitting   Upper Body Bathing: Set up;Sitting   Lower Body Bathing: Sit to/from stand;Minimal assistance   Upper Body Dressing : Set up;Sitting   Lower Body Dressing: Minimal assistance;Sit to/from stand Lower Body Dressing Details (indicate cue type and reason): able to don shoes, min assist sit to stand Toilet Transfer: Minimal assistance;Ambulation;RW;Regular Toilet;Grab bars Toilet Transfer Details (indicate cue type and reason): min assist to power up from commode  Toileting- Clothing Manipulation and Hygiene: Moderate assistance;Sit to/from stand Toileting - Clothing Manipulation Details (indicate cue type and reason): for clothing mgmt and hygiene thoroughness      Functional mobility during ADLs: Min guard;Rolling walker General ADL Comments: pt limited by weakness, decreased activity tolerance, balance     Vision   Vision Assessment?: No apparent visual deficits     Perception     Praxis      Pertinent Vitals/Pain Pain Assessment: No/denies pain     Hand Dominance Right   Extremity/Trunk Assessment Upper Extremity Assessment Upper Extremity Assessment: Generalized weakness   Lower Extremity Assessment Lower Extremity Assessment: Defer to PT evaluation   Cervical / Trunk Assessment Cervical / Trunk Assessment: Normal   Communication Communication Communication: No difficulties   Cognition Arousal/Alertness:  Awake/alert Behavior During Therapy: WFL for tasks assessed/performed Overall Cognitive Status: History of cognitive impairments - at baseline                                 General Comments: hx of  dementia, oriented and aware of situation, follows commands and pleasant    General Comments  VSS on RA     Exercises     Shoulder Instructions      Home Living Family/patient expects to be discharged to:: Skilled nursing facility                                        Prior Functioning/Environment Level of Independence: Independent with assistive device(s)        Comments: pt reports ambulating with RW for longer distances, independent basic ADLs         OT Problem List: Decreased strength;Decreased activity tolerance;Impaired balance (sitting and/or standing);Decreased cognition;Decreased safety awareness;Decreased knowledge of use of DME or AE;Decreased knowledge of precautions      OT Treatment/Interventions: Self-care/ADL training;DME and/or AE instruction;Therapeutic activities;Balance training;Patient/family education    OT Goals(Current goals can be found in the care plan section) Acute Rehab OT Goals Patient Stated Goal: to get stronger  OT Goal Formulation: With patient Time For Goal Achievement: 10/31/19 Potential to Achieve Goals: Good  OT Frequency: Min 2X/week   Barriers to D/C:            Co-evaluation              AM-PAC OT "6 Clicks" Daily Activity     Outcome Measure Help from another person eating meals?: A Little Help from another person taking care of personal grooming?: A Little Help from another person toileting, which includes using toliet, bedpan, or urinal?: A Lot Help from another person bathing (including washing, rinsing, drying)?: A Little Help from another person to put on and taking off regular upper body clothing?: A Little Help from another person to put on and taking off regular lower body clothing?: A Little 6 Click Score: 17   End of Session Equipment Utilized During Treatment: Gait belt;Rolling walker Nurse Communication: Mobility status  Activity Tolerance: Patient tolerated treatment well Patient  left: in bed;with call bell/phone within reach;with bed alarm set  OT Visit Diagnosis: Other abnormalities of gait and mobility (R26.89);Muscle weakness (generalized) (M62.81)                Time: [id number] OT Time Calculation (min): 29 min Charges:  OT General Charges $OT Visit: 1 Visit OT Evaluation $OT Eval Low Complexity: 1 Low OT Treatments $Self Care/Home Management : 8-22 mins  Jolaine Artist, OT Acute Rehabilitation Services Pager [phone] Office [phone]   Delight Stare 10/17/2019, 10:36 AM

## 2019-10-17 NOTE — Progress Notes (Signed)
CRITICAL VALUE ALERT  Critical Value:  42  Date & Time Noticed: 20:29  Provider Notified: No   Orders Received/Actions taken:  Attempted to give pt 8 oz orange juice but pt was slow to drink juice and stated she was too sleepy to finish.   20: 57    Rechecked CBG = 45  21:07      Administered 50 mL of dextrose 50% solution  21:33      Rechecked CBG = 179

## 2019-10-17 NOTE — Plan of Care (Signed)
  Problem: Pain Managment: Goal: General experience of comfort will improve Outcome: Progressing   Problem: Safety: Goal: Ability to remain free from injury will improve Outcome: Progressing   

## 2019-10-18 ENCOUNTER — Encounter (HOSPITAL_COMMUNITY): Payer: Self-pay | Admitting: Internal Medicine

## 2019-10-18 LAB — GLUCOSE, CAPILLARY
Glucose-Capillary: 207 mg/dL — ABNORMAL HIGH (ref 70–99)
Glucose-Capillary: 163 mg/dL — ABNORMAL HIGH (ref 70–99)

## 2019-10-18 LAB — BASIC METABOLIC PANEL
Anion gap: 7 (ref 5–15)
BUN: 8 mg/dL (ref 8–23)
CO2: 17 mmol/L — ABNORMAL LOW (ref 22–32)
Calcium: 8 mg/dL — ABNORMAL LOW (ref 8.9–10.3)
Chloride: 116 mmol/L — ABNORMAL HIGH (ref 98–111)
Creatinine, Ser: 0.73 mg/dL (ref 0.44–1.00)
GFR calc Af Amer: >60 mL/min (ref >60)
GFR calc non Af Amer: >60 mL/min (ref >60)
Glucose, Bld: 309 mg/dL — ABNORMAL HIGH (ref 70–99)
Potassium: 3.6 mmol/L (ref 3.5–5.1)
Sodium: 140 mmol/L (ref 135–145)

## 2019-10-18 NOTE — Plan of Care (Signed)
  Problem: Education: Goal: Knowledge of General Education information will improve Description Including pain rating scale, medication(s)/side effects and non-pharmacologic comfort measures Outcome: Progressing   

## 2019-10-18 NOTE — NC FL2 (Signed)
[place] MEDICAID FL2 LEVEL OF CARE SCREENING TOOL     IDENTIFICATION  Patient Name: Katie Lozano Birthdate: [birth year] Sex: female Admission Date (Current Location): 10/16/2019  [facility] and [place] Number:  Herbalist and Address:  The . [facility] [place] [address] [place], [place] [address]      Provider Number: [id number]  Attending Physician Name and Address:  Shelly Coss, MD  Relative Name and Phone Number:  Cala Bradford [phone]    Current Level of Care: Hospital Recommended Level of Care: [place] Prior Approval Number:    Date Approved/Denied: 07/17/16 PASRR Number: [id number]  Discharge Plan: SNF    Current Diagnoses: Patient Active Problem List   Diagnosis Date Noted  . Near syncope 10/16/2019  . Generalized weakness 10/16/2019  . History of stroke 10/16/2019  . Dehydration 10/16/2019  . History of Clostridioides difficile colitis 10/16/2019  . History of UTI 10/16/2019  . Seizure ([place]) 05/15/2018  . Cerebral embolism with cerebral infarction 09/16/2016  . Protein-calorie malnutrition, severe 09/15/2016  . Acute encephalopathy 09/14/2016  . 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 Diabetes mellitus ([place]) 09/02/2016  . Anemia 09/02/2016  . 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 NSTEMI (non-ST elevated myocardial infarction) ([place]) 07/22/2016  . Pressure injury of skin 07/17/2016  . UGIB (upper gastrointestinal bleed)   . Essential hypertension 06/06/2016  . CKD (chronic kidney disease), stage III 06/06/2016  . Carotid artery syndrome 06/05/2016  . Type 2 diabetes mellitus with diabetic nephropathy, with long-term current use of insulin ([place]) 03/30/2015  . History of colonic polyps 03/30/2015  . Hyperlipidemia 03/30/2015    Orientation RESPIRATION BLADDER Height & Weight     Self, Situation, Place  Normal Incontinent Weight: 117 lb 4.6 oz (53.2 kg) Height:  5\' 4"  (162.6 cm)  BEHAVIORAL  SYMPTOMS/MOOD NEUROLOGICAL BOWEL NUTRITION STATUS      Continent Diet(See Discharge Summary)  AMBULATORY STATUS COMMUNICATION OF NEEDS Skin   Limited Assist Verbally Other (Comment)(Appropriate for ethnicity dry excoriated scratch marks arm right skin turgor non-tenting)                       Personal Care Assistance Level of Assistance  Bathing, Feeding, Dressing Bathing Assistance: Limited assistance Feeding assistance: Independent(able to feed self) Dressing Assistance: Limited assistance     Functional Limitations Info  Sight, Hearing, Speech Sight Info: Impaired Hearing Info: Adequate Speech Info: Adequate    SPECIAL CARE FACTORS FREQUENCY  PT (By licensed PT), OT (By licensed OT)     PT Frequency: 5x min weekly OT Frequency: 5x min weekly            Contractures Contractures Info: Not present    Additional Factors Info  Code Status, Allergies Code Status Info: DNR Allergies Info: Trulicity dulaglutide           Current Medications (10/18/2019):  This is the current hospital active medication list Current Facility-Administered Medications  Medication Dose Route Frequency Provider Last Rate Last Admin  . 0.9 %  sodium chloride infusion   Intravenous Continuous Norval Morton, MD 75 mL/hr at 10/18/19 0928 New Bag at 10/18/19 [id number]  . alum & mag hydroxide-simeth (MAALOX/MYLANTA) 200-200-20 MG/5ML suspension 30 mL  30 mL Oral Q4H PRN Smith, Rondell A, MD      . clopidogrel (PLAVIX) tablet 75 mg  75 mg Oral Daily Tamala Julian, Rondell A, MD   75 mg at 10/18/19 0923  . feeding supplement (GLUCERNA SHAKE) ([place])  liquid 237 mL  237 mL Oral TID BM Smith, Rondell A, MD   237 mL at 10/17/19 1959  . insulin aspart (novoLOG) injection 0-9 Units  0-9 Units Subcutaneous TID WC Norval Morton, MD   3 Units at 10/18/19 0840  . insulin glargine (LANTUS) injection 35 Units  35 Units Subcutaneous QHS Norval Morton, MD   35 Units at 10/17/19 2211  . loperamide (IMODIUM)  capsule 2-4 mg  2-4 mg Oral Q6H PRN Fuller Plan A, MD      . melatonin tablet 6 mg  6 mg Oral QHS Smith, Rondell A, MD   6 mg at 10/17/19 2211  . metoprolol succinate (TOPROL-XL) 24 hr tablet 25 mg  25 mg Oral Daily Fuller Plan A, MD   25 mg at 10/18/19 [id number]  . olopatadine (PATANOL) 0.1 % ophthalmic solution 1 drop  1 drop Both Eyes BID Fuller Plan A, MD   1 drop at 10/18/19 0923  . potassium chloride SA (KLOR-CON) CR tablet 20 mEq  20 mEq Oral Daily Fuller Plan A, MD   20 mEq at 10/18/19 [id number]  . simvastatin (ZOCOR) tablet 20 mg  20 mg Oral QHS Fuller Plan A, MD   20 mg at 10/17/19 2210     Discharge Medications: Please see discharge summary for a list of discharge medications.  Relevant Imaging Results:  Relevant Lab Results:   Additional Information [id number]  Trula Ore, LCSWA

## 2019-10-18 NOTE — Discharge Summary (Signed)
Physician Discharge Summary  Katie Lozano [id number] DOB: 1940-11-13 DOA: 10/16/2019  PCP: Bernerd Limbo, MD  Admit date: 10/16/2019 Discharge date: 10/18/2019  Admitted From: SNF Disposition: SNF  Discharge Condition:Stable CODE STATUS: DNR Diet recommendation: Heart Healthy   Brief/Interim Summary:  Patient is 79 year old female with history of CVA, insulin-dependent diabetes mellitus, hyperlipidemia, anemia, GI bleed, GERD, mild dementia who presented from nursing facility with complaints of generalized weakness.  She recently had C. difficile and completed antibiotic course.  She also complained of burning urination.  She became unresponsive at the  nursing facility.  Still complaining of diarrhea.  On presentation she was hemodynamically stable.  Admitted for management of generalized weakness and poor oral intake.  Started on IV fluids.  She was having few episodes of diarrhea.  C. difficile tested, toxin was negative so most likely it was colonization but she does not have any active C. difficile infection.  She is hemodynamically stable for discharge back to facility today.  Following problems were addressed during her hospitalization:  Weakness/near syncope/dehydration: Most likely secondary to diarrhea from C. difficile.  Not orthostatic on presentation.  Dehydrated.  Started on IV fluids. PT/OT evaluation done.    Lactic acidosis: Most likely secondary to dehydration.  Elevated lactate on presentation.  Resolved.  Insulin-dependent diabetes mellitus: Hemoglobin A1c 7.3. Continue previous regimen.  Recent history of C. difficile/urinary tract infection: She has completed antibiotics course.  Patient still having some episodes of  diarrhea.  C. difficile tested, toxin negative.  Denies any abdominal pain, nausea or vomiting  Urinalysis negative for UTI.    Hypertension: Continue metoprolol  CKD stage IIIa: Kidney function near baseline.  Dementia: Has mild dementia  but currently alert and oriented.  Continue supportive care.  History of stroke: Continue Plavix   Discharge Diagnoses:  Principal Problem:   Generalized weakness Active Problems:   Type 2 diabetes mellitus with diabetic nephropathy, with long-term current use of insulin (HCC)   Hyperlipidemia   Essential hypertension   CKD (chronic kidney disease), stage III   Near syncope   History of stroke   Dehydration   History of Clostridioides difficile colitis   History of UTI    Discharge Instructions  Discharge Instructions    Diet - low sodium heart healthy   Complete by: As directed    Discharge instructions   Complete by: As directed    1)Do a CBC and BMP tests in a week.   Increase activity slowly   Complete by: As directed      Allergies as of 10/18/2019      Reactions   Trulicity [dulaglutide] Diarrhea      Medication List    TAKE these medications   acetaminophen 325 MG tablet Commonly known as: TYLENOL Take 650 mg by mouth every 4 (four) hours as needed for fever.   aluminum-magnesium hydroxide-simethicone [id number] MG/5ML Susp Commonly known as: MAALOX Take 30 mLs by mouth every 4 (four) hours as needed (heartburn).   cholecalciferol 25 MCG (1000 UNIT) tablet Commonly known as: VITAMIN D3 Take 2,000 Units by mouth at bedtime.   clopidogrel 75 MG tablet Commonly known as: PLAVIX Take 1 tablet (75 mg total) by mouth daily.   divalproex 250 MG DR tablet Commonly known as: Depakote Take 1 tablet (250 mg total) by mouth 2 (two) times daily.   insulin aspart 100 UNIT/ML injection Commonly known as: novoLOG Inject 0-10 Units into the skin See admin instructions. Inject 0-10 units subcutaneously before meals and at  bedtime per sliding scale: CBG 0-250 0 units, 251-350 4 units, 351-450 6 units, 451-550 8 units, 551-600 10 units and call provider   insulin glargine 100 UNIT/ML injection Commonly known as: LANTUS Inject 0.35 mLs (35 Units total) into the  skin at bedtime.   loperamide 2 MG tablet Commonly known as: IMODIUM A-D Take 2-4 mg by mouth See admin instructions. Take 2 tablets (4 mg) by mouth as needed for diarrhea (1st dose), then take 1 tablet (2 mg) every 6 hours as needed for diarrhea - max 6 tablets in 24 hours   melatonin 3 MG Tabs tablet Take 6 mg by mouth at bedtime.   metoprolol succinate 25 MG 24 hr tablet Commonly known as: TOPROL-XL Take 1 tablet (25 mg total) by mouth daily.   NUTRITIONAL SUPPLEMENTS PO Take 90 mLs by mouth 3 (three) times daily with meals. NSA Med Pass for additional protein calories   olopatadine 0.1 % ophthalmic solution Commonly known as: PATANOL Place 1 drop into both eyes 2 (two) times daily. For dry eyes   pantoprazole 40 MG tablet Commonly known as: PROTONIX Take 1 tablet (40 mg total) daily by mouth. Switch for any other PPI at similar dose and frequency   potassium chloride SA 20 MEQ tablet Commonly known as: KLOR-CON Take 1 tablet (20 mEq total) by mouth daily.   simvastatin 20 MG tablet Commonly known as: ZOCOR Take 20 mg by mouth at bedtime.      Follow-up Information    Bernerd Limbo, MD. Schedule an appointment as soon as possible for a visit in 1 week(s).   Specialty: Family Medicine Contact information: [address] [place] [address] [phone]          Allergies  Allergen Reactions  . Trulicity [Dulaglutide] Diarrhea    Consultations:  None   Procedures/Studies: CT Head Wo Contrast  Result Date: 10/16/2019 CLINICAL DATA:  Altered mental status. EXAM: CT HEAD WITHOUT CONTRAST TECHNIQUE: Contiguous axial images were obtained from the base of the skull through the vertex without intravenous contrast. COMPARISON:  05/15/2018 FINDINGS: Brain: Stable moderately enlarged ventricles and subarachnoid spaces. Mild-to-moderate patchy white matter low density in both cerebral hemispheres without significant change. Stable old right caudate lacunar  infarct. No intracranial hemorrhage, mass lesion or CT evidence of acute infarction. Vascular: No hyperdense vessel or unexpected calcification. Skull: Normal. Negative for fracture or focal lesion. Sinuses/Orbits: Unremarkable. Other: Left temporomandibular joint degenerative changes. IMPRESSION: 1. No acute abnormality. 2. Stable atrophy, chronic small vessel white matter ischemic changes and old right caudate lacunar infarct. 3. Left TMJ degenerative changes. Electronically Signed   By: Claudie Revering M.D.   On: 10/16/2019 10:48   CT ABDOMEN PELVIS W CONTRAST  Result Date: 10/08/2019 CLINICAL DATA:  Left upper quadrant and epigastric abdominal pain. Generalized weakness. Patient is currently being treated for a UTI. EXAM: CT ABDOMEN AND PELVIS WITH CONTRAST TECHNIQUE: Multidetector CT imaging of the abdomen and pelvis was performed using the standard protocol following bolus administration of intravenous contrast. CONTRAST:  147mL OMNIPAQUE IOHEXOL 300 MG/ML  SOLN COMPARISON:  None. FINDINGS: Lower chest: Limited visualization of the lower thorax demonstrates trace right-sided pleural effusion with bibasilar sub pleural atelectasis, right greater than left. No discrete focal airspace opacities. There are 3 punctate (sub 5 mm) pulmonary nodules are seen within the left costophrenic angle (images 16, 23 and 32, series 5). Normal heart size.  No pericardial effusion. Hepatobiliary: Normal hepatic contour. There is a punctate subcentimeter hypoattenuating lesion  within the posterior aspect the right lobe of the liver (image 17, series 3), which is too small to accurately characterize though favored to represent a hepatic cyst. There is a minimal amount of focal fatty infiltration adjacent to the fissure for the ligamentum teres. Post cholecystectomy. No intra or extrahepatic biliary ductal dilatation. No ascites. Pancreas: The pancreas is diffusely atrophic without ductal dilatation. Spleen: Normal appearance of  the spleen. Adrenals/Urinary Tract: There is symmetric enhancement and excretion of the bilateral kidneys. There is an approximately 1.1 x 0.7 cm nonobstructing stone within the interpolar aspect of the right kidney (axial image 30, series 3; coronal image 63, series 6). There is a punctate (approximately 2 mm) nonobstructing stone within the inferior pole the left kidney (coronal image 50, series 6). No discrete renal lesions. No urine obstruction or perinephric stranding. Normal appearance the bilateral adrenal glands. Normal appearance of the urinary bladder given degree distention. Stomach/Bowel: Rather extensive colonic diverticulosis without evidence of superimposed acute diverticulitis. Normal appearance of the terminal ileum. The appendix is not visualized compatible with provided operative history. Small hiatal hernia. No discrete areas of bowel wall thickening. No pneumoperitoneum, pneumatosis or portal venous gas. Vascular/Lymphatic: Moderate amount of eccentric calcified and noncalcified atherosclerotic plaque within normal caliber abdominal aorta, not resulting in a hemodynamically significant stenosis. Atherosclerotic plaque involves the origin of the major branch vessels of the abdominal aorta though the major branch vessels appear patent on this non CTA examination. 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 The IMA remains patent. No bulky retroperitoneal, mesenteric, pelvic or inguinal lymphadenopathy. Reproductive: Normal appearance of the uterus. Normal appearance of the pelvic organs. No free fluid the pelvic cul-de-sac. Other: There is a minimal amount of subcutaneous edema about the lateral aspects of the inferior abdominal pannus bilaterally, likely at the location of subcutaneous medication administration. Musculoskeletal: No acute or aggressive osseous abnormalities. Mild to moderate multilevel lumbar spine DDD, worse at L3-L4 with disc  space height loss, endplate irregularity and sclerosis. IMPRESSION: 1. No definite explanation for patient's left upper quadrant epigastric abdominal pain. Specifically, no evidence of enteric or urinary obstruction. Post appendectomy and cholecystectomy. 2. Rather extensive colonic diverticulosis without evidence of superimposed acute diverticulitis. 3. Nonobstructing bilateral nephrolithiasis, right greater than left. 4. Punctate (sub 5 mm) pulmonary nodules within the left costophrenic angle. No follow-up needed if patient is low-risk (and has no known or suspected primary neoplasm). Non-contrast chest CT can be considered in 12 months if patient is high-risk. This recommendation follows the consensus statement: Guidelines for Management of Incidental Pulmonary Nodules Detected on CT Images: From the Fleischner Society 2017; Radiology 2017; 284:228-243. 5.  Aortic Atherosclerosis (ICD10-I70.0). Electronically Signed   By: Sandi Mariscal M.D.   On: 10/08/2019 13:11   DG Chest Portable 1 View  Result Date: 10/16/2019 CLINICAL DATA:  Weakness. EXAM: PORTABLE CHEST 1 VIEW COMPARISON:  09/14/2016 FINDINGS: Normal sized heart. Clear lungs with normal vascularity. Diffuse osteopenia. Mild scoliosis. Cervical spine degenerative changes. IMPRESSION: No acute abnormality. Electronically Signed   By: Claudie Revering M.D.   On: 10/16/2019 12:13       Subjective: Patient seen and examined at the bedside this morning.  Hemodynamically stable for discharge.  Discharge Exam: Vitals:   10/18/19 0500 10/18/19 0926  BP: (!) 123/59 91/60  Pulse: 70 70  Resp: 15 18  Temp: 98.4 F (36.9 C)   SpO2: 95% 95%   Vitals:   10/17/19 2153 10/18/19  0200 10/18/19 0500 10/18/19 0926  BP: 137/60  (!) 123/59 91/60  Pulse: 76 71 70 70  Resp: 16 20 15 18   Temp: 98.6 F (37 C)  98.4 F (36.9 C)   TempSrc: Oral  Oral   SpO2: 94% 93% 95% 95%  Weight:   53.2 kg   Height:        General: Pt is alert, awake, not in acute  distress Cardiovascular: RRR, S1/S2 +, no rubs, no gallops Respiratory: CTA bilaterally, no wheezing, no rhonchi Abdominal: Soft, NT, ND, bowel sounds + Extremities: no edema, no cyanosis    The results of significant diagnostics from this hospitalization (including imaging, microbiology, ancillary and laboratory) are listed below for reference.     Microbiology: Recent Results (from the past 240 hour(s))  Culture, blood (routine x 2)     Status: None (Preliminary result)   Collection Time: 10/16/19 10:44 AM   Specimen: BLOOD RIGHT FOREARM  Result Value Ref Range Status   Specimen Description BLOOD RIGHT FOREARM  Final   Special Requests   Final    BOTTLES DRAWN AEROBIC AND ANAEROBIC Blood Culture results may not be optimal due to an inadequate volume of blood received in culture bottles   Culture   Final    NO GROWTH 2 DAYS Performed at [facility], [place] [address], [place], [place] [address]    Report Status PENDING  Incomplete  Culture, blood (routine x 2)     Status: None (Preliminary result)   Collection Time: 10/16/19 11:04 AM   Specimen: BLOOD RIGHT HAND  Result Value Ref Range Status   Specimen Description BLOOD RIGHT HAND  Final   Special Requests   Final    BOTTLES DRAWN AEROBIC ONLY Blood Culture results may not be optimal due to an inadequate volume of blood received in culture bottles   Culture   Final    NO GROWTH 2 DAYS Performed at [facility], [place] [address], [place], [place] [address]    Report Status PENDING  Incomplete  SARS CORONAVIRUS 2 (TAT 6-24 HRS) Nasopharyngeal Nasopharyngeal Swab     Status: None   Collection Time: 10/16/19  2:17 PM   Specimen: Nasopharyngeal Swab  Result Value Ref Range Status   SARS Coronavirus 2 NEGATIVE NEGATIVE Final    Comment: (NOTE) SARS-CoV-2 target nucleic acids are NOT DETECTED. The SARS-CoV-2 RNA is generally detectable in upper and lower respiratory specimens during the acute phase of infection.  Negative results do not preclude SARS-CoV-2 infection, do not rule out co-infections with other pathogens, and should not be used as the sole basis for treatment or other patient management decisions. Negative results must be combined with clinical observations, patient history, and epidemiological information. The expected result is Negative. Fact Sheet for Patients: SugarRoll.be Fact Sheet for Healthcare Providers: https://www.woods-mathews.com/ This test is not yet approved or cleared by the [place] FDA and  has been authorized for detection and/or diagnosis of SARS-CoV-2 by FDA under an Emergency Use Authorization (EUA). This EUA will remain  in effect (meaning this test can be used) for the duration of the COVID-19 declaration under Section 56 4(b)(1) of the Act, 21 U.S.C. section 360bbb-3(b)(1), unless the authorization is terminated or revoked sooner. Performed at [facility], [place] [address], [place], [place] [address]   Urine culture     Status: None   Collection Time: 10/16/19  2:47 PM   Specimen: Urine, Random  Result Value Ref Range Status  Specimen Description URINE, RANDOM  Final   Special Requests NONE  Final   Culture   Final    NO GROWTH Performed at [facility], [place] [address], [place], [place] [address]    Report Status 10/17/2019 FINAL  Final  C Difficile Quick Screen w PCR reflex     Status: Abnormal   Collection Time: 10/17/19  3:00 PM   Specimen: STOOL  Result Value Ref Range Status   C Diff antigen POSITIVE (A) NEGATIVE Final   C Diff toxin NEGATIVE NEGATIVE Final   C Diff interpretation Results are indeterminate. See PCR results.  Final    Comment: Performed at [facility], [place] [address] [place], [place] [address]  C. Diff by PCR, Reflexed     Status: None   Collection Time: 10/17/19  3:00 PM  Result Value Ref Range Status   Toxigenic C. Difficile by PCR NEGATIVE NEGATIVE Final     Comment: Patient is colonized with non toxigenic C. difficile. May not need treatment unless significant symptoms are present. Performed at [facility], [place] [address], [place], [place] [address]      Labs: BNP (last 3 results) No results for input(s): BNP in the last 8760 hours. Basic Metabolic Panel: Recent Labs  Lab 10/16/19 0528 10/17/19 0805 10/18/19 0436  NA 139 138 140  K 3.7 3.7 3.6  CL 103 110 116*  CO2 21* 17* 17*  GLUCOSE 139* 144* 309*  BUN 21 12 8   CREATININE 1.12* 0.87 0.73  CALCIUM 9.8 8.3* 8.0*   Liver Function Tests: Recent Labs  Lab 10/16/19 0528  AST 26  ALT 13  ALKPHOS 81  BILITOT 1.1  PROT 7.2  ALBUMIN 3.5   No results for input(s): LIPASE, AMYLASE in the last 168 hours. No results for input(s): AMMONIA in the last 168 hours. CBC: Recent Labs  Lab 10/16/19 0528  WBC 10.1  HGB 15.7*  HCT 47.3*  MCV 97.3  PLT 248   Cardiac Enzymes: Recent Labs  Lab 10/16/19 0528  CKTOTAL 36*   BNP: Invalid input(s): POCBNP CBG: Recent Labs  Lab 10/17/19 1317 10/17/19 1613 10/17/19 2155 10/18/19 0751 10/18/19 1109  GLUCAP 74 124* 186* 207* 163*   D-Dimer No results for input(s): DDIMER in the last 72 hours. Hgb A1c Recent Labs    10/16/19 0528  HGBA1C 7.3*   Lipid Profile No results for input(s): CHOL, HDL, LDLCALC, TRIG, CHOLHDL, LDLDIRECT in the last 72 hours. Thyroid function studies Recent Labs    10/16/19 1049  TSH 2.071   Anemia work up No results for input(s): VITAMINB12, FOLATE, FERRITIN, TIBC, IRON, RETICCTPCT in the last 72 hours. Urinalysis    Component Value Date/Time   COLORURINE YELLOW 10/16/2019 1447   APPEARANCEUR CLEAR 10/16/2019 1447   LABSPEC 1.021 10/16/2019 1447   PHURINE 5.0 10/16/2019 1447   GLUCOSEU NEGATIVE 10/16/2019 1447   HGBUR NEGATIVE 10/16/2019 1447   BILIRUBINUR NEGATIVE 10/16/2019 1447   KETONESUR 20 (A) 10/16/2019 1447   PROTEINUR NEGATIVE 10/16/2019 1447   NITRITE NEGATIVE  10/16/2019 1447   LEUKOCYTESUR NEGATIVE 10/16/2019 1447   Sepsis Labs Invalid input(s): PROCALCITONIN,  WBC,  LACTICIDVEN Microbiology Recent Results (from the past 240 hour(s))  Culture, blood (routine x 2)     Status: None (Preliminary result)   Collection Time: 10/16/19 10:44 AM   Specimen: BLOOD RIGHT FOREARM  Result Value Ref Range Status   Specimen Description BLOOD RIGHT FOREARM  Final   Special  Requests   Final    BOTTLES DRAWN AEROBIC AND ANAEROBIC Blood Culture results may not be optimal due to an inadequate volume of blood received in culture bottles   Culture   Final    NO GROWTH 2 DAYS Performed at [facility], [place] [address], [place], [place] [address]    Report Status PENDING  Incomplete  Culture, blood (routine x 2)     Status: None (Preliminary result)   Collection Time: 10/16/19 11:04 AM   Specimen: BLOOD RIGHT HAND  Result Value Ref Range Status   Specimen Description BLOOD RIGHT HAND  Final   Special Requests   Final    BOTTLES DRAWN AEROBIC ONLY Blood Culture results may not be optimal due to an inadequate volume of blood received in culture bottles   Culture   Final    NO GROWTH 2 DAYS Performed at [facility], [place] [address] [place], [place] [address]    Report Status PENDING  Incomplete  SARS CORONAVIRUS 2 (TAT 6-24 HRS) Nasopharyngeal Nasopharyngeal Swab     Status: None   Collection Time: 10/16/19  2:17 PM   Specimen: Nasopharyngeal Swab  Result Value Ref Range Status   SARS Coronavirus 2 NEGATIVE NEGATIVE Final    Comment: (NOTE) SARS-CoV-2 target nucleic acids are NOT DETECTED. The SARS-CoV-2 RNA is generally detectable in upper and lower respiratory specimens during the acute phase of infection. Negative results do not preclude SARS-CoV-2 infection, do not rule out co-infections with other pathogens, and should not be used as the sole basis for treatment or other patient management decisions. Negative results must be combined with  clinical observations, patient history, and epidemiological information. The expected result is Negative. Fact Sheet for Patients: SugarRoll.be Fact Sheet for Healthcare Providers: https://www.woods-mathews.com/ This test is not yet approved or cleared by the [place] FDA and  has been authorized for detection and/or diagnosis of SARS-CoV-2 by FDA under an Emergency Use Authorization (EUA). This EUA will remain  in effect (meaning this test can be used) for the duration of the COVID-19 declaration under Section 56 4(b)(1) of the Act, 21 U.S.C. section 360bbb-3(b)(1), unless the authorization is terminated or revoked sooner. Performed at [facility], [place] [address], [place], [place] [address]   Urine culture     Status: None   Collection Time: 10/16/19  2:47 PM   Specimen: Urine, Random  Result Value Ref Range Status   Specimen Description URINE, RANDOM  Final   Special Requests NONE  Final   Culture   Final    NO GROWTH Performed at [facility], [place] [address], [place], [place] [address]    Report Status 10/17/2019 FINAL  Final  C Difficile Quick Screen w PCR reflex     Status: Abnormal   Collection Time: 10/17/19  3:00 PM   Specimen: STOOL  Result Value Ref Range Status   C Diff antigen POSITIVE (A) NEGATIVE Final   C Diff toxin NEGATIVE NEGATIVE Final   C Diff interpretation Results are indeterminate. See PCR results.  Final    Comment: Performed at [facility], [place] [address] [place], [place] [address]  C. Diff by PCR, Reflexed     Status: None   Collection Time: 10/17/19  3:00 PM  Result Value Ref Range Status   Toxigenic C. Difficile by PCR NEGATIVE NEGATIVE Final    Comment: Patient is colonized with non toxigenic C. difficile. May not need treatment unless significant  symptoms are present. Performed at [facility], [place] [address] [place], [place] [address]     Please note: You were cared  for by a hospitalist during your hospital stay. Once you are discharged, your primary care physician will handle any further medical issues. Please note that NO REFILLS for any discharge medications will be authorized once you are discharged, as it is imperative that you return to your primary care physician (or establish a relationship with a primary care physician if you do not have one) for your post hospital discharge needs so that they can reassess your need for medications and monitor your lab values.    Time coordinating discharge: 40 minutes  SIGNED:   Shelly Coss, MD  [facility] 10/18/2019, 12:04 PM Pager [phone]  If 7PM-7AM, please contact night-coverage [email or web address] Password [email or web address]

## 2019-10-18 NOTE — TOC Transition Note (Signed)
Transition of Care [facility]) - CM/SW Discharge Note   Patient Details  Name: Katie Lozano MRN: [id number] Date of Birth: September 20, 1940  [facility] CM/SW Contact:  Trula Ore, [place] Phone Number: 10/18/2019, 12:17 PM   Clinical Narrative:     Patient will DC to: [facility]  Anticipated DC date: 10/18/2019  Family notified: Nelle   Transport by: Corey Harold  ?  Per MD patient ready for DC to [facility] . RN, patient, patient's family, and facility notified of DC. Discharge Summary sent to facility. RN given number for report tele# [phone]. DC packet on chart. Ambulance transport requested for patient.  CSW signing off.   Final next level of care: [place] Barriers to Discharge: No Barriers Identified   Patient Goals and CMS Choice Patient states their goals for this hospitalization and ongoing recovery are:: to go to [employer] [facility].gov Compare Post Acute Care list provided to:: Patient Choice offered to / list presented to : Patient  Discharge Placement              Patient [facility]: [facility]) Patient to be transferred to facility by: [place] Name of family member notified: Cala Bradford [phone] Patient and family notified of of transfer: 10/18/19  Discharge Plan and Services                                     Social Determinants of Health (SDOH) Interventions     Readmission Risk Interventions No flowsheet data found.

## 2019-10-18 NOTE — TOC Initial Note (Signed)
Transition of Care [facility]) - Initial/Assessment Note    Patient Details  Name: Katie Lozano MRN: [id number] Date of Birth: 06-07-1941  Transition of Care [facility]) CM/SW Contact:    Trula Ore, [place] Phone Number: 10/18/2019, 11:12 AM  Clinical Narrative:           Patient is agreeable to going back to [facility] SNF. Plan to DC today.  CSW will continue to follow.          Expected Discharge Plan: Skilled Nursing Facility Barriers to Discharge: No Barriers Identified   Patient Goals and CMS Choice Patient states their goals for this hospitalization and ongoing recovery are:: to go to skilled nursing facility CMS Medicare.gov Compare Post Acute Care list provided to:: Patient Choice offered to / list presented to : Patient  Expected Discharge Plan and Services Expected Discharge Plan: [place]       Living arrangements for the past 2 months: [place]                                      Prior Living Arrangements/Services Living arrangements for the past 2 months: [place]   Patient language and need for interpreter reviewed:: Yes Do you feel safe going back to the place where you live?: Yes      Need for Family Participation in Patient Care: Yes (Comment) Care giver support system in place?: Yes (comment)   Criminal Activity/Legal Involvement Pertinent to Current Situation/Hospitalization: No - Comment as needed  Activities of Daily Living Home Assistive Devices/Equipment: Walker (specify type) ADL Screening (condition at time of admission) Patient's cognitive ability adequate to safely complete daily activities?: No Is the patient deaf or have difficulty hearing?: No Does the patient have difficulty seeing, even when wearing glasses/contacts?: No Does the patient have difficulty concentrating, remembering, or making decisions?: Yes Patient able to express need for assistance with ADLs?: Yes Does the  patient have difficulty dressing or bathing?: Yes Independently performs ADLs?: No Communication: Independent Is this a change from baseline?: Pre-admission baseline Dressing (OT): Needs assistance Is this a change from baseline?: Pre-admission baseline Grooming: Needs assistance Is this a change from baseline?: Pre-admission baseline Feeding: Independent Bathing: Needs assistance Is this a change from baseline?: Pre-admission baseline Toileting: Needs assistance Is this a change from baseline?: Pre-admission baseline In/Out Bed: Needs assistance Is this a change from baseline?: Pre-admission baseline Walks in Home: Needs assistance Is this a change from baseline?: Pre-admission baseline Does the patient have difficulty walking or climbing stairs?: Yes Weakness of Legs: Both Weakness of Arms/Hands: None  Permission Sought/Granted Permission sought to share information with : Case Manager, Customer service manager, Family Supports Permission granted to share information with : Yes, Verbal Permission Granted              Emotional Assessment Appearance:: Appears stated age Attitude/Demeanor/Rapport: Gracious Affect (typically observed): Calm Orientation: : Oriented to Self, Oriented to Place, Oriented to Situation Alcohol / Substance Use: Not Applicable Psych Involvement: No (comment)  Admission diagnosis:  Lactic acidosis [E87.2] Near syncope [R55] Generalized weakness [R53.1] Patient Active Problem List   Diagnosis Date Noted  . Near syncope 10/16/2019  . Generalized weakness 10/16/2019  . History of stroke 10/16/2019  . Dehydration 10/16/2019  . History of Clostridioides difficile colitis 10/16/2019  . History of UTI 10/16/2019  . Seizure ([place]) 05/15/2018  . Cerebral embolism with cerebral infarction  09/16/2016  . Protein-calorie malnutrition, severe 09/15/2016  . Acute encephalopathy 09/14/2016  . 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 Diabetes  mellitus ([place]) 09/02/2016  . Anemia 09/02/2016  . 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 NSTEMI (non-ST elevated myocardial infarction) ([place]) 07/22/2016  . Pressure injury of skin 07/17/2016  . UGIB (upper gastrointestinal bleed)   . Essential hypertension 06/06/2016  . CKD (chronic kidney disease), stage III 06/06/2016  . Carotid artery syndrome 06/05/2016  . Type 2 diabetes mellitus with diabetic nephropathy, with long-term current use of insulin ([place]) 03/30/2015  . History of colonic polyps 03/30/2015  . Hyperlipidemia 03/30/2015   PCP:  Bernerd Limbo, MD Pharmacy:  No Pharmacies Listed    Social Determinants of Health (SDOH) Interventions    Readmission Risk Interventions No flowsheet data found.

## 2019-10-18 NOTE — Progress Notes (Signed)
Report called to [place] at [facility].

## 2019-10-21 LAB — CULTURE, BLOOD (ROUTINE X 2)
Culture: NO GROWTH
Culture: NO GROWTH

## 2019-12-04 ENCOUNTER — Emergency Department (HOSPITAL_COMMUNITY): Payer: Medicare (Managed Care)

## 2019-12-04 ENCOUNTER — Inpatient Hospital Stay (HOSPITAL_COMMUNITY)
Admission: EM | Admit: 2019-12-04 | Discharge: 2019-12-09 | DRG: 637 | Disposition: A | Discharge status: Skilled Nursing Facility | Payer: Medicare (Managed Care) | Attending: Family Medicine | Admitting: Family Medicine

## 2019-12-04 ENCOUNTER — Encounter (HOSPITAL_COMMUNITY): Payer: Self-pay | Admitting: Emergency Medicine

## 2019-12-04 DIAGNOSIS — K922 Gastrointestinal hemorrhage, unspecified: Secondary | ICD-10-CM

## 2019-12-04 DIAGNOSIS — L89151 Pressure ulcer of sacral region, stage 1: Secondary | ICD-10-CM | POA: Diagnosis present

## 2019-12-04 DIAGNOSIS — Z20822 Contact with and (suspected) exposure to covid-19: Secondary | ICD-10-CM | POA: Diagnosis present

## 2019-12-04 DIAGNOSIS — J690 Pneumonitis due to inhalation of food and vomit: Secondary | ICD-10-CM | POA: Diagnosis present

## 2019-12-04 DIAGNOSIS — I129 Hypertensive chronic kidney disease with stage 1 through stage 4 chronic kidney disease, or unspecified chronic kidney disease: Secondary | ICD-10-CM | POA: Diagnosis present

## 2019-12-04 DIAGNOSIS — K219 Gastro-esophageal reflux disease without esophagitis: Secondary | ICD-10-CM | POA: Diagnosis present

## 2019-12-04 DIAGNOSIS — Z888 Allergy status to other drugs, medicaments and biological substances status: Secondary | ICD-10-CM

## 2019-12-04 DIAGNOSIS — N390 Urinary tract infection, site not specified: Secondary | ICD-10-CM | POA: Diagnosis present

## 2019-12-04 DIAGNOSIS — Z8619 Personal history of other infectious and parasitic diseases: Secondary | ICD-10-CM | POA: Diagnosis not present

## 2019-12-04 DIAGNOSIS — N179 Acute kidney failure, unspecified: Secondary | ICD-10-CM | POA: Diagnosis present

## 2019-12-04 DIAGNOSIS — Z79899 Other long term (current) drug therapy: Secondary | ICD-10-CM

## 2019-12-04 DIAGNOSIS — Z1612 Extended spectrum beta lactamase (ESBL) resistance: Secondary | ICD-10-CM | POA: Diagnosis present

## 2019-12-04 DIAGNOSIS — N182 Chronic kidney disease, stage 2 (mild): Secondary | ICD-10-CM | POA: Diagnosis present

## 2019-12-04 DIAGNOSIS — R066 Hiccough: Secondary | ICD-10-CM | POA: Diagnosis present

## 2019-12-04 DIAGNOSIS — Z833 Family history of diabetes mellitus: Secondary | ICD-10-CM

## 2019-12-04 DIAGNOSIS — E11649 Type 2 diabetes mellitus with hypoglycemia without coma: Secondary | ICD-10-CM | POA: Diagnosis not present

## 2019-12-04 DIAGNOSIS — L8990 Pressure ulcer of unspecified site, unspecified stage: Secondary | ICD-10-CM | POA: Diagnosis present

## 2019-12-04 DIAGNOSIS — Z8249 Family history of ischemic heart disease and other diseases of the circulatory system: Secondary | ICD-10-CM

## 2019-12-04 DIAGNOSIS — Z9114 Patient's other noncompliance with medication regimen: Secondary | ICD-10-CM

## 2019-12-04 DIAGNOSIS — E785 Hyperlipidemia, unspecified: Secondary | ICD-10-CM | POA: Diagnosis present

## 2019-12-04 DIAGNOSIS — A419 Sepsis, unspecified organism: Secondary | ICD-10-CM | POA: Diagnosis not present

## 2019-12-04 DIAGNOSIS — Z9049 Acquired absence of other specified parts of digestive tract: Secondary | ICD-10-CM

## 2019-12-04 DIAGNOSIS — E1122 Type 2 diabetes mellitus with diabetic chronic kidney disease: Secondary | ICD-10-CM | POA: Diagnosis present

## 2019-12-04 DIAGNOSIS — R0602 Shortness of breath: Secondary | ICD-10-CM

## 2019-12-04 DIAGNOSIS — G9341 Metabolic encephalopathy: Secondary | ICD-10-CM | POA: Diagnosis present

## 2019-12-04 DIAGNOSIS — E1110 Type 2 diabetes mellitus with ketoacidosis without coma: Secondary | ICD-10-CM | POA: Diagnosis present

## 2019-12-04 DIAGNOSIS — E876 Hypokalemia: Secondary | ICD-10-CM | POA: Diagnosis present

## 2019-12-04 DIAGNOSIS — Z823 Family history of stroke: Secondary | ICD-10-CM

## 2019-12-04 DIAGNOSIS — K920 Hematemesis: Secondary | ICD-10-CM | POA: Diagnosis not present

## 2019-12-04 DIAGNOSIS — F0390 Unspecified dementia without behavioral disturbance: Secondary | ICD-10-CM | POA: Diagnosis present

## 2019-12-04 DIAGNOSIS — Z86010 Personal history of colonic polyps: Secondary | ICD-10-CM

## 2019-12-04 DIAGNOSIS — E872 Acidosis, unspecified: Secondary | ICD-10-CM

## 2019-12-04 DIAGNOSIS — R1310 Dysphagia, unspecified: Secondary | ICD-10-CM | POA: Diagnosis present

## 2019-12-04 DIAGNOSIS — G20 Parkinson's disease: Secondary | ICD-10-CM | POA: Diagnosis not present

## 2019-12-04 DIAGNOSIS — Z8371 Family history of colonic polyps: Secondary | ICD-10-CM

## 2019-12-04 DIAGNOSIS — Z8673 Personal history of transient ischemic attack (TIA), and cerebral infarction without residual deficits: Secondary | ICD-10-CM | POA: Diagnosis not present

## 2019-12-04 DIAGNOSIS — Z66 Do not resuscitate: Secondary | ICD-10-CM | POA: Diagnosis present

## 2019-12-04 DIAGNOSIS — Z794 Long term (current) use of insulin: Secondary | ICD-10-CM

## 2019-12-04 DIAGNOSIS — Z7902 Long term (current) use of antithrombotics/antiplatelets: Secondary | ICD-10-CM

## 2019-12-04 DIAGNOSIS — E1111 Type 2 diabetes mellitus with ketoacidosis with coma: Secondary | ICD-10-CM | POA: Diagnosis not present

## 2019-12-04 DIAGNOSIS — Z87891 Personal history of nicotine dependence: Secondary | ICD-10-CM

## 2019-12-04 DIAGNOSIS — D72829 Elevated white blood cell count, unspecified: Secondary | ICD-10-CM

## 2019-12-04 DIAGNOSIS — F0281 Dementia in other diseases classified elsewhere with behavioral disturbance: Secondary | ICD-10-CM | POA: Diagnosis not present

## 2019-12-04 LAB — CBC WITH DIFFERENTIAL/PLATELET
Abs Immature Granulocytes: 0.1 10*3/uL — ABNORMAL HIGH (ref 0.00–0.07)
Basophils Absolute: 0.1 10*3/uL (ref 0.0–0.1)
Basophils Relative: 0 %
Eosinophils Absolute: 0 10*3/uL (ref 0.0–0.5)
Eosinophils Relative: 0 %
HCT: 44.4 % (ref 36.0–46.0)
Hemoglobin: 14.6 g/dL (ref 12.0–15.0)
Immature Granulocytes: 1 %
Lymphocytes Relative: 9 %
Lymphs Abs: 1.8 10*3/uL (ref 0.7–4.0)
MCH: 33.3 pg (ref 26.0–34.0)
MCHC: 32.9 g/dL (ref 30.0–36.0)
MCV: 101.4 fL — ABNORMAL HIGH (ref 80.0–100.0)
Monocytes Absolute: 1.1 10*3/uL — ABNORMAL HIGH (ref 0.1–1.0)
Monocytes Relative: 5 %
Neutro Abs: 17.3 10*3/uL — ABNORMAL HIGH (ref 1.7–7.7)
Neutrophils Relative %: 85 %
Platelets: 290 10*3/uL (ref 150–400)
RBC: 4.38 MIL/uL (ref 3.87–5.11)
RDW: 13 % (ref 11.5–15.5)
WBC: 20.3 10*3/uL — ABNORMAL HIGH (ref 4.0–10.5)
nRBC: 0 % (ref 0.0–0.2)

## 2019-12-04 LAB — COMPREHENSIVE METABOLIC PANEL
ALT: 13 U/L (ref 0–44)
AST: 18 U/L (ref 15–41)
Albumin: 3 g/dL — ABNORMAL LOW (ref 3.5–5.0)
Alkaline Phosphatase: 73 U/L (ref 38–126)
Anion gap: 21 — ABNORMAL HIGH (ref 5–15)
BUN: 47 mg/dL — ABNORMAL HIGH (ref 8–23)
CO2: 14 mmol/L — ABNORMAL LOW (ref 22–32)
Calcium: 8.9 mg/dL (ref 8.9–10.3)
Chloride: 104 mmol/L (ref 98–111)
Creatinine, Ser: 1.64 mg/dL — ABNORMAL HIGH (ref 0.44–1.00)
GFR calc Af Amer: 34 mL/min — ABNORMAL LOW (ref >60)
GFR calc non Af Amer: 29 mL/min — ABNORMAL LOW (ref >60)
Glucose, Bld: 490 mg/dL — ABNORMAL HIGH (ref 70–99)
Potassium: 4.9 mmol/L (ref 3.5–5.1)
Sodium: 139 mmol/L (ref 135–145)
Total Bilirubin: 1.6 mg/dL — ABNORMAL HIGH (ref 0.3–1.2)
Total Protein: 6.3 g/dL — ABNORMAL LOW (ref 6.5–8.1)

## 2019-12-04 LAB — BASIC METABOLIC PANEL
Anion gap: 18 — ABNORMAL HIGH (ref 5–15)
Anion gap: 10 (ref 5–15)
BUN: 39 mg/dL — ABNORMAL HIGH (ref 8–23)
BUN: 36 mg/dL — ABNORMAL HIGH (ref 8–23)
CO2: 13 mmol/L — ABNORMAL LOW (ref 22–32)
CO2: 16 mmol/L — ABNORMAL LOW (ref 22–32)
Calcium: 8.2 mg/dL — ABNORMAL LOW (ref 8.9–10.3)
Calcium: 8.1 mg/dL — ABNORMAL LOW (ref 8.9–10.3)
Chloride: 112 mmol/L — ABNORMAL HIGH (ref 98–111)
Chloride: 118 mmol/L — ABNORMAL HIGH (ref 98–111)
Creatinine, Ser: 1.35 mg/dL — ABNORMAL HIGH (ref 0.44–1.00)
Creatinine, Ser: 1.01 mg/dL — ABNORMAL HIGH (ref 0.44–1.00)
GFR calc Af Amer: 43 mL/min — ABNORMAL LOW (ref >60)
GFR calc Af Amer: >60 mL/min (ref >60)
GFR calc non Af Amer: 37 mL/min — ABNORMAL LOW (ref >60)
GFR calc non Af Amer: 53 mL/min — ABNORMAL LOW (ref >60)
Glucose, Bld: 333 mg/dL — ABNORMAL HIGH (ref 70–99)
Glucose, Bld: 182 mg/dL — ABNORMAL HIGH (ref 70–99)
Potassium: 4.1 mmol/L (ref 3.5–5.1)
Potassium: 3.6 mmol/L (ref 3.5–5.1)
Sodium: 143 mmol/L (ref 135–145)
Sodium: 144 mmol/L (ref 135–145)

## 2019-12-04 LAB — URINALYSIS, ROUTINE W REFLEX MICROSCOPIC
Bacteria, UA: NONE SEEN
Bilirubin Urine: NEGATIVE
Glucose, UA: >=500 mg/dL — AB
Hgb urine dipstick: NEGATIVE
Ketones, ur: 80 mg/dL — AB
Leukocytes,Ua: NEGATIVE
Nitrite: NEGATIVE
Protein, ur: NEGATIVE mg/dL
Specific Gravity, Urine: 1.022 (ref 1.005–1.030)
pH: 5 (ref 5.0–8.0)

## 2019-12-04 LAB — I-STAT VENOUS BLOOD GAS, ED
Acid-base deficit: 11 mmol/L — ABNORMAL HIGH (ref 0.0–2.0)
Bicarbonate: 15.8 mmol/L — ABNORMAL LOW (ref 20.0–28.0)
Calcium, Ion: 1.18 mmol/L (ref 1.15–1.40)
HCT: 41 % (ref 36.0–46.0)
Hemoglobin: 13.9 g/dL (ref 12.0–15.0)
O2 Saturation: 70 %
Potassium: 4.7 mmol/L (ref 3.5–5.1)
Sodium: 142 mmol/L (ref 135–145)
TCO2: 17 mmol/L — ABNORMAL LOW (ref 22–32)
pCO2, Ven: 38.1 mmHg — ABNORMAL LOW (ref 44.0–60.0)
pH, Ven: 7.226 — ABNORMAL LOW (ref 7.250–7.430)
pO2, Ven: 43 mmHg (ref 32.0–45.0)

## 2019-12-04 LAB — GLUCOSE, CAPILLARY
Glucose-Capillary: 162 mg/dL — ABNORMAL HIGH (ref 70–99)
Glucose-Capillary: 132 mg/dL — ABNORMAL HIGH (ref 70–99)
Glucose-Capillary: 134 mg/dL — ABNORMAL HIGH (ref 70–99)

## 2019-12-04 LAB — CBG MONITORING, ED
Glucose-Capillary: 465 mg/dL — ABNORMAL HIGH (ref 70–99)
Glucose-Capillary: 451 mg/dL — ABNORMAL HIGH (ref 70–99)
Glucose-Capillary: 307 mg/dL — ABNORMAL HIGH (ref 70–99)
Glucose-Capillary: 235 mg/dL — ABNORMAL HIGH (ref 70–99)
Glucose-Capillary: 295 mg/dL — ABNORMAL HIGH (ref 70–99)
Glucose-Capillary: 192 mg/dL — ABNORMAL HIGH (ref 70–99)
Glucose-Capillary: 190 mg/dL — ABNORMAL HIGH (ref 70–99)
Glucose-Capillary: 195 mg/dL — ABNORMAL HIGH (ref 70–99)

## 2019-12-04 LAB — SARS CORONAVIRUS 2 BY RT PCR (HOSPITAL ORDER, PERFORMED IN ~~LOC~~ HOSPITAL LAB): SARS Coronavirus 2: NEGATIVE

## 2019-12-04 LAB — TYPE AND SCREEN
ABO/RH(D): O POS
Antibody Screen: NEGATIVE

## 2019-12-04 LAB — PROTIME-INR
INR: 1.3 — ABNORMAL HIGH (ref 0.8–1.2)
Prothrombin Time: 15.4 seconds — ABNORMAL HIGH (ref 11.4–15.2)

## 2019-12-04 LAB — POC OCCULT BLOOD, ED: Fecal Occult Bld: POSITIVE — AB

## 2019-12-04 LAB — LIPASE, BLOOD: Lipase: 17 U/L (ref 11–51)

## 2019-12-04 LAB — CBC
HCT: 35.3 % — ABNORMAL LOW (ref 36.0–46.0)
Hemoglobin: 11.8 g/dL — ABNORMAL LOW (ref 12.0–15.0)
MCH: 33.4 pg (ref 26.0–34.0)
MCHC: 33.4 g/dL (ref 30.0–36.0)
MCV: 100 fL (ref 80.0–100.0)
Platelets: 238 10*3/uL (ref 150–400)
RBC: 3.53 MIL/uL — ABNORMAL LOW (ref 3.87–5.11)
RDW: 13.2 % (ref 11.5–15.5)
WBC: 21.1 10*3/uL — ABNORMAL HIGH (ref 4.0–10.5)
nRBC: 0 % (ref 0.0–0.2)

## 2019-12-04 LAB — LACTIC ACID, PLASMA
Lactic Acid, Venous: 4.8 mmol/L (ref 0.5–1.9)
Lactic Acid, Venous: 4.2 mmol/L (ref 0.5–1.9)

## 2019-12-04 LAB — BETA-HYDROXYBUTYRIC ACID: Beta-Hydroxybutyric Acid: 0.67 mmol/L — ABNORMAL HIGH (ref 0.05–0.27)

## 2019-12-04 LAB — MRSA PCR SCREENING: MRSA by PCR: NEGATIVE

## 2019-12-04 MED ORDER — FERROUS SULFATE 325 (65 FE) MG PO TABS
325.0000 mg | ORAL_TABLET | Freq: Every day | ORAL | Status: DC
  Filled 2019-12-04: qty 1

## 2019-12-04 MED ORDER — DEXTROSE 50 % IV SOLN: 0.0000 mL | INTRAVENOUS | Status: DC | PRN

## 2019-12-04 MED ORDER — LOPERAMIDE HCL 2 MG PO CAPS: 2.0000 mg | ORAL_CAPSULE | Freq: Four times a day (QID) | ORAL | Status: DC | PRN

## 2019-12-04 MED ORDER — PANTOPRAZOLE SODIUM 40 MG IV SOLR
40.0000 mg | Freq: Two times a day (BID) | INTRAVENOUS | Status: DC
  Administered 2019-12-04 – 2019-12-06 (×4): 40 mg via INTRAVENOUS
  Filled 2019-12-04 (×4): qty 40

## 2019-12-04 MED ORDER — CLOPIDOGREL BISULFATE 75 MG PO TABS: 75.0000 mg | ORAL_TABLET | Freq: Every day | ORAL | Status: DC

## 2019-12-04 MED ORDER — PANTOPRAZOLE SODIUM 40 MG PO TBEC: 40.0000 mg | DELAYED_RELEASE_TABLET | Freq: Every day | ORAL | Status: DC

## 2019-12-04 MED ORDER — SODIUM CHLORIDE 0.9 % IV SOLN
500.0000 mg | INTRAVENOUS | Status: DC
  Filled 2019-12-04 (×2): qty 0.5

## 2019-12-04 MED ORDER — VITAMIN D 25 MCG (1000 UNIT) PO TABS
2000.0000 [IU] | ORAL_TABLET | Freq: Every day | ORAL | Status: DC
  Filled 2019-12-04: qty 2

## 2019-12-04 MED ORDER — INSULIN REGULAR(HUMAN) IN NACL 100-0.9 UT/100ML-% IV SOLN
INTRAVENOUS | Status: DC
  Administered 2019-12-04: 8 [IU]/h via INTRAVENOUS
  Filled 2019-12-04: qty 100

## 2019-12-04 MED ORDER — CHLORHEXIDINE GLUCONATE CLOTH 2 % EX PADS
6.0000 | MEDICATED_PAD | Freq: Every day | CUTANEOUS | Status: DC
  Administered 2019-12-05 – 2019-12-09 (×4): 6 via TOPICAL

## 2019-12-04 MED ORDER — INSULIN ASPART 100 UNIT/ML IV SOLN
10.0000 [IU] | Freq: Once | INTRAVENOUS | Status: AC
  Administered 2019-12-04: 10 [IU] via INTRAVENOUS

## 2019-12-04 MED ORDER — SODIUM CHLORIDE 0.9 % IV BOLUS
1000.0000 mL | INTRAVENOUS | Status: AC
  Administered 2019-12-04: 1000 mL via INTRAVENOUS

## 2019-12-04 MED ORDER — PIPERACILLIN-TAZOBACTAM 3.375 G IVPB 30 MIN
3.3750 g | Freq: Once | INTRAVENOUS | Status: AC
  Administered 2019-12-04: 3.375 g via INTRAVENOUS
  Filled 2019-12-04: qty 50

## 2019-12-04 MED ORDER — MELATONIN 3 MG PO TABS
6.0000 mg | ORAL_TABLET | Freq: Every day | ORAL | Status: DC
  Filled 2019-12-04 (×2): qty 2

## 2019-12-04 MED ORDER — SIMVASTATIN 20 MG PO TABS
20.0000 mg | ORAL_TABLET | Freq: Every day | ORAL | Status: DC
  Filled 2019-12-04: qty 1

## 2019-12-04 MED ORDER — SODIUM CHLORIDE 0.9 % IV BOLUS
1000.0000 mL | Freq: Once | INTRAVENOUS | Status: AC
  Administered 2019-12-04: 1000 mL via INTRAVENOUS

## 2019-12-04 MED ORDER — PROMETHAZINE HCL 25 MG RE SUPP
25.0000 mg | RECTAL | Status: DC | PRN
  Filled 2019-12-04: qty 1

## 2019-12-04 MED ORDER — PANTOPRAZOLE SODIUM 40 MG IV SOLR
40.0000 mg | Freq: Once | INTRAVENOUS | Status: AC
  Administered 2019-12-04: 40 mg via INTRAVENOUS
  Filled 2019-12-04: qty 40

## 2019-12-04 MED ORDER — POTASSIUM CHLORIDE 10 MEQ/100ML IV SOLN
10.0000 meq | INTRAVENOUS | Status: AC
  Administered 2019-12-04 (×2): 10 meq via INTRAVENOUS
  Filled 2019-12-04: qty 100

## 2019-12-04 MED ORDER — DEXTROSE-NACL 5-0.45 % IV SOLN: INTRAVENOUS | Status: DC

## 2019-12-04 MED ORDER — BACID PO TABS
2.0000 | ORAL_TABLET | Freq: Three times a day (TID) | ORAL | Status: DC
  Filled 2019-12-04 (×3): qty 2

## 2019-12-04 MED ORDER — OLOPATADINE HCL 0.1 % OP SOLN
1.0000 [drp] | Freq: Two times a day (BID) | OPHTHALMIC | Status: DC
  Administered 2019-12-04 – 2019-12-09 (×9): 1 [drp] via OPHTHALMIC
  Filled 2019-12-04: qty 5

## 2019-12-04 MED ORDER — SODIUM CHLORIDE 0.9 % IV SOLN: INTRAVENOUS | Status: DC

## 2019-12-04 MED ORDER — DIVALPROEX SODIUM 250 MG PO DR TAB
250.0000 mg | DELAYED_RELEASE_TABLET | Freq: Two times a day (BID) | ORAL | Status: DC
  Filled 2019-12-04 (×2): qty 1

## 2019-12-04 MED ORDER — ALUM & MAG HYDROXIDE-SIMETH 200-200-20 MG/5ML PO SUSP: 30.0000 mL | ORAL | Status: DC | PRN

## 2019-12-04 NOTE — ED Notes (Signed)
RECTAL TEMP 98

## 2019-12-04 NOTE — Progress Notes (Signed)
Pt has arrived to [facility]. Alert oriented to self only. Pt identified appropriately, identification band in place. VS stable, no acute distress noted.  Pt placed on bedside monitor and CCMD notified.  Insulin at 4.4 and D5% 0.45 NS @ 75 ml/hr infusing. Pt oriented to room and equipment, instructed to use call bell for assistance and call bell left within pt reach. MD notified of recent BMP results. Will continue to monitor pt and treat per MD orders.

## 2019-12-04 NOTE — Progress Notes (Signed)
Pharmacy Antibiotic Note  Katie Lozano is a 79 y.o. female admitted on 12/04/2019 with UTI.  Pharmacy has been consulted for Ertepenem dosing. Per nursing home patient grew ESBL in urine culture and was started on Ertapenem on what appears to be 6/1.     Temp (24hrs), Avg:98.1 F (36.7 C), Min:98.1 F (36.7 C), Max:98.1 F (36.7 C)  Recent Labs  Lab 12/04/19 1122 12/04/19 1234 12/04/19 1441  WBC 20.3*  --   --   CREATININE 1.64*  --  1.35*  LATICACIDVEN  --  4.8* 4.2*    CrCl cannot be calculated (Unknown ideal weight.).    Allergies  Allergen Reactions  . Trulicity [Dulaglutide] Diarrhea    Antimicrobials this admission: 6/6 Ertapenem >>   Dose adjustments this admission:   Microbiology results: ESBL per nursing home   Plan:  - With patients last recorded weight CrCL was ~ 28  - Decrease dose to Ertapenem 500mg  IV q24h at this time - Monitor patients renal function and urine output   Thank you for allowing pharmacy to be a part of this patient's care.  Duanne Limerick 12/04/2019 3:57 PM

## 2019-12-04 NOTE — Progress Notes (Signed)
Pt refused scheduled meds, still confused, and she stated "I do not trust anyone".  [place], NP notified.

## 2019-12-04 NOTE — ED Notes (Signed)
Patient transported to CT 

## 2019-12-04 NOTE — Progress Notes (Signed)
°   12/04/19 2052  MEWS Score  MEWS Score Color Yellow  MEWS score of 2 is not an acute change, MD aware. Will continue to monitor pt.

## 2019-12-04 NOTE — ED Triage Notes (Signed)
Pt here with [place] coffe grounds x2days. Has not taken meds x2days or insulin. 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 Ems gave 500cc of ns. Pt alert to self.

## 2019-12-04 NOTE — ED Provider Notes (Signed)
Medical screening examination/treatment/procedure(s) were conducted as a shared visit with non-physician practitioner(s) and myself.  I personally evaluated the patient during the encounter. Briefly, the patient is a 79 y.o. female with history of dementia, diabetes who presents the ED with altered mental status.  Patient with overall unremarkable vitals.  Patient with some mild confusion on exam.  She appears pale.  She has had some coffee-ground emesis over the last several days.  History of upper GI bleed in the past.  Hemoccult is positive but grossly brown stool on exam.  Complains of some upper GI pain, upper abdominal pain.  Has not had much to eat or drink and not taking her medications at her facility.  Patient is DNR.  Concern for GI bleed versus DKA versus dehydration versus intra-abdominal process such as bowel obstruction.  Lab work significant for leukocytosis of 20, lactic acid of 4.8.  pH is 7.2.  Bicarb is 16.  Anion gap is 21.  Overall appears that patient is in DKA.  Patient given 30 cc/kg IV fluids.  DKA protocol started and insulin infusion has begun.  Hemoglobin overall is unremarkable and stable.  Patient given Protonix in case some of her GI process.  Does not appear to be any signs of an acute bleed.  Awaiting urinalysis, CT abdomen pelvis, chest x-ray to evaluate for any possible infectious process.  IV antibiotics have been empirically given given leukocytosis and lactic acidosis.  However patient does not have fever.  She currently is undergoing treatment for C. difficile per her facility.  Will evaluate for any evidence of toxic megacolon.  But DKA could be in the setting of poor p.o. intake and noncompliance and ongoing infectious process with C. difficile.  Will admit patient following these images and rule out any surgical process.  Patient is DNR.  Family has been engaged about how aggressive we get with therapy as well.  This chart was dictated using voice recognition software.   Despite best efforts to proofread,  errors can occur which can change the documentation meaning.     EKG Interpretation  Date/Time:  Sunday December 04 2019 11:14:23 EDT Ventricular Rate:  95 PR Interval:    QRS Duration: 124 QT Interval:  398 QTC Calculation: 501 R Axis:   45 Text Interpretation: Sinus rhythm Left bundle branch block Confirmed by Lennice Sites [id number]) on 12/04/2019 11:32:21 AM           Lennice Sites, DO 12/04/19 1355

## 2019-12-04 NOTE — ED Provider Notes (Addendum)
[name]   CSN: [id number] Arrival date & time: 12/04/19  1106     History Chief Complaint  Patient presents with  . Altered Mental Status  . Hyperglycemia  . Nausea  . Emesis   LEVEL 5 CAVEAT - DEMENTIA  Katie Lozano is a 79 y.o. female with PMHx HLD, GERD, Diabetes, and hx of UGIB who presents to the ED via EMS from [facility] in [place] with complaint of altered mental status that staff noticed this morning.  Per EMS they report that patient has had coffee-ground emesis x2 days.  She had been refusing to take any of her medications including her insulin.  When they went and rounded on her this morning they noticed her to be more confused than normal - EMS was called out.  She was given fluids with EMS.  CBG in the 500s with EMS.  Is currently on Plavix and has a history of upper GI bleed.  He has a history of dementia is unable to provide much history however she denies any pain.  Alert to self.  Spoke with nursing staff Levada Dy, she reports that patient had been having some coffee-ground emesis since yesterday and did not want to take her medications which is not her baseline.  She states that today she seemed very confused and seemed to have visual hallucinations which is not her baseline.  Staff called patient's Sister Katie Lozano who recommended she come to [facility] ED for further evaluation.   Sister had initially said that patient was in quarantine currently being treated for C. difficile.  She states that she has been being treated intermittently and was recently placed into isolation 10 days ago.  She believes she is on vancomycin.  EMS had initially stated that she was being treated for MSSA.  Papers were brought from the facility, last piece of paper does state that patient is currently being treated with ertapenem for ESBL UTI.   The history is provided by the patient, medical records, the EMS personnel and the  nursing home.       Past Medical History:  Diagnosis Date  . Allergy   . Anemia associated with acute blood loss 06/2016  . Colon polyp   . Diabetes mellitus without complication ([place])   . Diverticulitis   . DKA (diabetic ketoacidoses) ([place]) 07/11/2016  . Esophageal necrosis   . GERD (gastroesophageal reflux disease)   . Hematemesis 07/11/2016  . Hiatal hernia   . Hyperlipidemia   . Melena 06/2016  . Stroke ([place])   . UGIB (upper gastrointestinal bleed) 06/2016  . Ulcerative esophagitis     Patient Active Problem List   Diagnosis Date Noted  . Near syncope 10/16/2019  . Generalized weakness 10/16/2019  . History of stroke 10/16/2019  . Dehydration 10/16/2019  . History of Clostridioides difficile colitis 10/16/2019  . History of UTI 10/16/2019  . Seizure ([place]) 05/15/2018  . Cerebral embolism with cerebral infarction 09/16/2016  . Protein-calorie malnutrition, severe 09/15/2016  . Acute encephalopathy 09/14/2016  . 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 Diabetes mellitus ([place]) 09/02/2016  . Anemia 09/02/2016  . 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 NSTEMI (non-ST elevated myocardial infarction) ([place]) 07/22/2016  . Pressure injury of skin 07/17/2016  . UGIB (upper gastrointestinal bleed)   . DKA (diabetic ketoacidoses) ([place]) 07/11/2016  . Essential hypertension 06/06/2016  . CKD (chronic kidney disease), stage III 06/06/2016  . Carotid artery syndrome 06/05/2016  . Type 2 diabetes  mellitus with diabetic nephropathy, with long-term current use of insulin ([place]) 03/30/2015  . History of colonic polyps 03/30/2015  . Hyperlipidemia 03/30/2015    Past Surgical History:  Procedure Laterality Date  . APPENDECTOMY  1956  . CHOLECYSTECTOMY  1997  . ESOPHAGOGASTRODUODENOSCOPY N/A 07/14/2016   Procedure: ESOPHAGOGASTRODUODENOSCOPY (EGD);  Surgeon: Manus Gunning, MD;  Location: [facility];  Service: Gastroenterology;  Laterality: N/A;  at bedside  .  TONSILLECTOMY     age 69     OB History   No obstetric history on file.     Family History  Problem Relation Age of Onset  . Heart disease Mother   . Melanoma Mother   . Stroke Mother   . Heart disease Father        MI  . Diabetes Father   . Colon cancer Maternal Uncle   . Colon polyps Maternal Uncle     Social History   Tobacco Use  . Smoking status: Former Smoker    Types: Cigarettes    Quit date: 06/29/2012    Years since quitting: 7.4  . Smokeless tobacco: Never Used  Substance Use Topics  . Alcohol use: No    Alcohol/week: 0.0 standard drinks  . Drug use: No    Home Medications Prior to Admission medications   Medication Sig Start Date End Date Taking? Authorizing Provider  aluminum-magnesium hydroxide-simethicone (MAALOX) [phone] MG/5ML SUSP Take 30 mLs by mouth every 4 (four) hours as needed (heartburn).   Yes [provider]  cholecalciferol (VITAMIN D3) 25 MCG (1000 UT) tablet Take 2,000 Units by mouth at bedtime.   Yes [provider]  clopidogrel (PLAVIX) 75 MG tablet Take 1 tablet (75 mg total) by mouth daily. 09/19/16  Yes Reyne Dumas, MD  divalproex (DEPAKOTE) 250 MG DR tablet Take 1 tablet (250 mg total) by mouth 2 (two) times daily. 05/16/18 12/04/19 Yes Amin, Ankit Chirag, MD  ferrous sulfate 325 (65 FE) MG tablet Take 325 mg by mouth daily.   Yes [provider]  insulin aspart (NOVOLOG) 100 UNIT/ML injection Inject 4-10 Units into the skin See admin instructions. Inject 4-10 units subcutaneously before meals and at bedtime per sliding scale: CBG 0-250 0 units, 251-350 4 units, 351-450 6 units, 451-550 8 units, 551-600 10 units and call provider 07/22/16  Yes [provider]  insulin glargine (LANTUS) 100 UNIT/ML injection Inject 0.35 mLs (35 Units total) into the skin at bedtime. Patient taking differently: Inject 41 Units into the skin at bedtime.  09/18/16  Yes Reyne Dumas, MD  loperamide (IMODIUM A-D) 2 MG  tablet Take 2-4 mg by mouth See admin instructions. Take 2 tablets (4 mg) by mouth as needed for diarrhea (1st dose), then take 1 tablet (2 mg) every 6 hours as needed for diarrhea - max 6 tablets in 24 hours   Yes [provider]  melatonin 3 MG TABS tablet Take 6 mg by mouth at bedtime.   Yes [provider]  metoprolol succinate (TOPROL-XL) 25 MG 24 hr tablet Take 1 tablet (25 mg total) by mouth daily. 09/19/16  Yes Reyne Dumas, MD  olopatadine (PATANOL) 0.1 % ophthalmic solution Place 1 drop into both eyes 2 (two) times daily. For dry eyes   Yes [provider]  pantoprazole (PROTONIX) 40 MG tablet Take 1 tablet (40 mg total) daily by mouth. Switch for any other PPI at similar dose and frequency 05/05/17  Yes Armbruster, Carlota Raspberry, MD  potassium chloride SA (K-DUR,KLOR-CON) 20  MEQ tablet Take 1 tablet (20 mEq total) by mouth daily. 09/18/16  Yes Reyne Dumas, MD  promethazine (PHENERGAN) 25 MG suppository Place 25 mg rectally every 4 (four) hours as needed for nausea (per standing order).   Yes [provider]  Semaglutide, 1 MG/DOSE, (OZEMPIC, 1 MG/DOSE,) 4 MG/3ML SOPN Inject 1 mg into the skin every Thursday.   Yes [provider]  simvastatin (ZOCOR) 20 MG tablet Take 20 mg by mouth at bedtime.  02/24/15  Yes [provider]    Allergies    Trulicity [dulaglutide]  Review of Systems   Review of Systems  Unable to perform ROS: Mental status change  Constitutional: Negative for chills and fever.  Gastrointestinal: Positive for nausea and vomiting.  Psychiatric/Behavioral: Positive for confusion and hallucinations.    Physical Exam Updated Vital Signs BP (!) 130/56   Pulse (!) 101   Temp 98.1 F (36.7 C) (Rectal)   Resp 18   SpO2 96%   Physical Exam Vitals and nursing note reviewed.  Constitutional:      Appearance: She is not ill-appearing or diaphoretic.  HENT:     Head: Normocephalic and atraumatic.     Comments: Dried  black colored vomitus on mouth Eyes:     Extraocular Movements: Extraocular movements intact.     Conjunctiva/sclera: Conjunctivae normal.     Pupils: Pupils are equal, round, and reactive to light.  Cardiovascular:     Rate and Rhythm: Normal rate and regular rhythm.     Pulses: Normal pulses.  Pulmonary:     Effort: Pulmonary effort is normal.     Breath sounds: Normal breath sounds. No wheezing, rhonchi or rales.  Abdominal:     Palpations: Abdomen is soft.     Tenderness: There is no abdominal tenderness. There is no guarding or rebound.  Musculoskeletal:     Cervical back: Neck supple.  Skin:    General: Skin is warm and dry.  Neurological:     Mental Status: She is alert.     Comments: Alert to self. Believes she is in a toy store, unable to assess year or date.  Follows commands after redirection No focal neuro deficits appreciated     ED Results / Procedures / Treatments   Labs (all labs ordered are listed, but only abnormal results are displayed) Labs Reviewed  COMPREHENSIVE METABOLIC PANEL - Abnormal; Notable for the following components:      Result Value   CO2 14 (*)    Glucose, Bld 490 (*)    BUN 47 (*)    Creatinine, Ser 1.64 (*)    Total Protein 6.3 (*)    Albumin 3.0 (*)    Total Bilirubin 1.6 (*)    GFR calc non Af Amer 29 (*)    GFR calc Af Amer 34 (*)    Anion gap 21 (*)    All other components within normal limits  CBC WITH DIFFERENTIAL/PLATELET - Abnormal; Notable for the following components:   WBC 20.3 (*)    MCV 101.4 (*)    Neutro Abs 17.3 (*)    Monocytes Absolute 1.1 (*)    Abs Immature Granulocytes 0.10 (*)    All other components within normal limits  URINALYSIS, ROUTINE W REFLEX MICROSCOPIC - Abnormal; Notable for the following components:   Color, Urine STRAW (*)    Glucose, UA >=500 (*)    Ketones, ur 80 (*)    All other components within normal limits  PROTIME-INR - Abnormal;  Notable for the following components:   Prothrombin  Time 15.4 (*)    INR 1.3 (*)    All other components within normal limits  LACTIC ACID, PLASMA - Abnormal; Notable for the following components:   Lactic Acid, Venous 4.8 (*)    All other components within normal limits  I-STAT VENOUS BLOOD GAS, ED - Abnormal; Notable for the following components:   pH, Ven 7.226 (*)    pCO2, Ven 38.1 (*)    Bicarbonate 15.8 (*)    TCO2 17 (*)    Acid-base deficit 11.0 (*)    All other components within normal limits  CBG MONITORING, ED - Abnormal; Notable for the following components:   Glucose-Capillary 465 (*)    All other components within normal limits  POC OCCULT BLOOD, ED - Abnormal; Notable for the following components:   Fecal Occult Bld POSITIVE (*)    All other components within normal limits  CBG MONITORING, ED - Abnormal; Notable for the following components:   Glucose-Capillary 451 (*)    All other components within normal limits  CBG MONITORING, ED - Abnormal; Notable for the following components:   Glucose-Capillary 307 (*)    All other components within normal limits  SARS CORONAVIRUS 2 BY RT PCR (HOSPITAL ORDER, [facility])  LIPASE, BLOOD  LACTIC ACID, PLASMA  BASIC METABOLIC PANEL  BASIC METABOLIC PANEL  BASIC METABOLIC PANEL  BASIC METABOLIC PANEL  BASIC METABOLIC PANEL  BETA-HYDROXYBUTYRIC ACID  BETA-HYDROXYBUTYRIC ACID  HEMOGLOBIN AND HEMATOCRIT, BLOOD  TYPE AND SCREEN    EKG EKG Interpretation  Date/Time:  Sunday December 04 2019 11:14:23 EDT Ventricular Rate:  95 PR Interval:    QRS Duration: 124 QT Interval:  398 QTC Calculation: 501 R Axis:   45 Text Interpretation: Sinus rhythm Left bundle branch block Confirmed by Lennice Sites [phone]) on 12/04/2019 11:32:21 AM   Radiology CT ABDOMEN PELVIS WO CONTRAST  Result Date: 12/04/2019 CLINICAL DATA:  GI bleeding. Coffee ground emesis for 2 days. Patient is lethargic and loosening. EXAM: CT ABDOMEN AND PELVIS WITHOUT CONTRAST TECHNIQUE:  Multidetector CT imaging of the abdomen and pelvis was performed following the standard protocol without IV contrast. COMPARISON:  CT of the abdomen and pelvis on 10/08/2019 FINDINGS: Lower chest: Bibasilar atelectasis. 5 millimeter nodule is identified in the RIGHT UPPER lobe on image 3 of series 5. This portion of the lung was not imaged on the previous exam. There is bibasilar atelectasis, RIGHT greater than LEFT. Scattered emphysematous changes are present. There is dense atherosclerotic calcification of the coronary arteries. Atherosclerotic calcification of the thoracic aorta. Hepatobiliary: Cholecystectomy.  Liver is unremarkable. Pancreas: Unremarkable. No pancreatic ductal dilatation or surrounding inflammatory changes. Spleen: Normal in size without focal abnormality. Adrenals/Urinary Tract: Adrenal glands are normal. Intrarenal calculus in the midpole region of the RIGHT kidney is 1.8 centimeters. A punctate intrarenal calculus is identified in the LOWER pole region of the RIGHT kidney. A punctate intrarenal stone is identified in the LOWER pole the LEFT kidney, 2 millimeters. No hydronephrosis or renal mass. Ureters are unremarkable. The bladder and visualized portion of the urethra are normal. Stomach/Bowel: Stomach is normal in appearance. A radiopaque density in the dependent aspect of the mid stomach likely represents ingested tablet. Small bowel loops are normal in caliber and wall thickness. There are innumerable colonic diverticula not associated with inflammatory changes. Colon is otherwise normal in appearance. The appendix is not seen. Vascular/Lymphatic: There is extensive atherosclerotic calcification of the abdominal  aorta not associated aneurysm. There is atherosclerosis at the origin of the superior mesenteric and celiac axis. Reproductive: Uterus is present.  No adnexal mass. Other: No abdominal wall hernia or abnormality. No abdominopelvic ascites. Musculoskeletal: Degenerative changes  are seen in the LOWER lumbar spine. Remote wedge compression fracture of T9. IMPRESSION: 1. No evidence for acute abnormality of the abdomen or pelvis. 2. Extensive colonic diverticulosis without evidence for acute diverticulitis. 3. Nonobstructing bilateral intrarenal calculi. 4. Cholecystectomy. 5. Remote wedge compression fracture of T9. 6. Coronary artery disease. 7. 5 millimeter nodule in the RIGHT UPPER lobe. No follow-up needed if patient is low-risk. Non-contrast chest CT can be considered in 12 months if patient is high-risk. This recommendation follows the consensus statement: Guidelines for Management of Incidental Pulmonary Nodules Detected on CT Images: From the Fleischner Society 2017; Radiology 2017; 284:228-243. 8. Aortic Atherosclerosis (ICD10-I70.0). 9.  Emphysema (ICD10-J43.9). Electronically Signed   By: Nolon Nations M.D.   On: 12/04/2019 14:08   CT Head Wo Contrast  Result Date: 12/04/2019 CLINICAL DATA:  Mental status changes of uncertain etiology; nausea and vomiting with coffee-ground emesis for 2 days, lethargic EXAM: CT HEAD WITHOUT CONTRAST TECHNIQUE: Contiguous axial images were obtained from the base of the skull through the vertex without intravenous contrast. Sagittal and coronal MPR images reconstructed from axial data set. Motion artifacts at skull base COMPARISON:  10/16/2019 FINDINGS: Brain: Generalized atrophy. Normal ventricular morphology. No midline shift or mass effect. Small vessel chronic ischemic changes of deep cerebral white matter. Small old RIGHT parietal infarct at vertex. No intracranial hemorrhage, mass lesion, evidence of acute infarction, or extra-axial fluid collection. Vascular: Atherosclerotic calcifications of internal carotid and vertebral arteries at skull base. Skull: Skull base limited in assessment due to motion artifacts. Remaining calvaria intact Sinuses/Orbits: Grossly clear Other: N/A IMPRESSION: Atrophy with small vessel chronic ischemic  changes of deep cerebral white matter. Small old high RIGHT parietal infarct. No acute intracranial abnormalities. Electronically Signed   By: Lavonia Dana M.D.   On: 12/04/2019 14:02   DG Chest Portable 1 View  Result Date: 12/04/2019 CLINICAL DATA:  Coffee-ground emesis for 2 days. EXAM: PORTABLE CHEST 1 VIEW COMPARISON:  10/16/2019 FINDINGS: Shallow lung inflation. Heart is mildly enlarged. Coronary stent partially imaged. There is mild perihilar opacity consistent with mild edema. A nodule overlying the RIGHT mid lung zone is consistent with 5 millimeter nodule identified in the RIGHT UPPER lobe on CT of the same day. There are no frank consolidations. No pneumothorax or pleural effusion. IMPRESSION: 1. Mild pulmonary edema. 2. RIGHT pulmonary nodule. Electronically Signed   By: Nolon Nations M.D.   On: 12/04/2019 14:10    Procedures .Critical Care Performed by: Eustaquio Maize, PA-C Authorized by: Eustaquio Maize, PA-C   Critical care provider statement:    Critical care time (minutes):  60   Critical care was necessary to treat or prevent imminent or life-threatening deterioration of the following conditions:  Sepsis and endocrine crisis   Critical care was time spent personally by me on the following activities:  Discussions with consultants, evaluation of patient's response to treatment, examination of patient, ordering and performing treatments and interventions, ordering and review of laboratory studies, ordering and review of radiographic studies, pulse oximetry, re-evaluation of patient's condition, obtaining history from patient or surrogate and review of old charts   (including critical care time)  Medications Ordered in ED Medications  insulin regular, human (MYXREDLIN) 100 units/ 100 mL infusion (8 Units/hr Intravenous New Bag/Given 12/04/19 1430)  0.9 %  sodium chloride infusion ( Intravenous New Bag/Given 12/04/19 1424)  dextrose 5 %-0.45 % sodium chloride infusion (has no  administration in time range)  dextrose 50 % solution 0-50 mL (has no administration in time range)  potassium chloride 10 mEq in 100 mL IVPB (10 mEq Intravenous New Bag/Given 12/04/19 1440)  simvastatin (ZOCOR) tablet 20 mg (has no administration in time range)  aluminum-magnesium hydroxide-simethicone (MAALOX) 200-200-20 MG/5ML suspension 30 mL (has no administration in time range)  loperamide (IMODIUM A-D) tablet 2-4 mg (has no administration in time range)  clopidogrel (PLAVIX) tablet 75 mg (has no administration in time range)  ferrous sulfate tablet 325 mg (has no administration in time range)  melatonin tablet 6 mg (has no administration in time range)  divalproex (DEPAKOTE) DR tablet 250 mg (has no administration in time range)  cholecalciferol (VITAMIN D3) tablet 2,000 Units (has no administration in time range)  promethazine (PHENERGAN) suppository 25 mg (has no administration in time range)  olopatadine (PATANOL) 0.1 % ophthalmic solution 1 drop (has no administration in time range)  pantoprazole (PROTONIX) injection 40 mg (has no administration in time range)  ertapenem (INVANZ) 1,000 mg in sodium chloride 0.9 % 100 mL IVPB (has no administration in time range)  sodium chloride 0.9 % bolus 1,000 mL (0 mLs Intravenous Stopped 12/04/19 1235)  insulin aspart (novoLOG) injection 10 Units (10 Units Intravenous Given 12/04/19 1254)  pantoprazole (PROTONIX) injection 40 mg (40 mg Intravenous Given 12/04/19 1231)  sodium chloride 0.9 % bolus 1,000 mL (0 mLs Intravenous Stopped 12/04/19 1508)  piperacillin-tazobactam (ZOSYN) IVPB 3.375 g (0 g Intravenous Stopped 12/04/19 1508)    ED Course  I have reviewed the triage vital signs and the nursing notes.  Pertinent labs & imaging results that were available during my care of the patient were reviewed by me and considered in my medical decision making (see chart for details).    MDM Rules/Calculators/A&P                      79 year old female who  presents the ED today from SNF due to altered mental status as well as coffee-ground emesis x2 days.  Apparently patient stopped taking her medications, glucose elevated greater than 500 on EMS arrival.  Some fluids were given.  Initial vitals with patient being afebrile, nontachycardic and nontachypneic.  She is only alert to herself and unable to provide much history, level 5 caveat initiated.  EMS, SNF, sister provided much of the history -was some confusion initially regarding patient being treated for C. difficile versus MSSA.  Daughter was under the impression that patient was being treated for C. difficile due to history of previous C. difficile infections however EMS stated patient was being treated for MSSA, they do not know what biotic patient is currently on.  Will attempt to contact facility - does not appear septic per vitals.  Patient could be in DKA given elevated glucose as well as refusing to take her medications.  Will work-up for this.  She does have dried black vomitus on her mouth however not actively vomiting in the ED, no abdominal tenderness on exam.  Patient does have a history of upper GI bleed, will obtain type and screen, coags, perform rectal exam to assess for fecal occult blood.  Will obtain CT abdomen and pelvis.    Fecal occult positive.  Will has not been vomiting in the ED, hemodynamically stable.  Type and screen has been ordered.  Will  discuss with patient's sister regarding wishes given patient is a DNR; would like to know how aggressive they want [place] to be in terms of patient needing blood versus other procedures including EGD.  I-STAT VBG with pH 7.2 bicarb of 15 consistent with DKA.  Awaiting for CMP -initial fluids and 10 units insulin have been given.  CBC has returned with a white blood cell count of 20,000.  She also has a lactic acid of 4.8. Concern for sepsis however vitals continue to be within normal limits. An additional liter fluids given.  She was started on  Zosyn with concern for intraabdominal infection.   CMP has returned with glucose 490, bicarb 14, anion gap of 21.  Started on DKA protocol with Endo tool.   CT head, CT abdomen and pelvis, chest x-ray negative for infection.  Still awaiting UA at this time.    After further investigation it does appear patient is actually being treated for ESBL UTI with ertapenem via PICC line - U/A negative for infection at this time however likely secondary to receiving medications since 05/25. Question if this is why she is not building a normal response with elevated HR. She will need to be admitted regardless for DKA with concern for UGIB.   Discussed case with Dr. Roosevelt Locks who agrees to accept patient for admission. Have updated sister Nelle - she would like to be updated if pt requires any procedures as her and pt's daughter want to discuss risks vs benefits.   This note was prepared using Dragon voice recognition software and may include unintentional dictation errors due to the inherent limitations of voice recognition software.   Final Clinical Impression(s) / ED Diagnoses Final diagnoses:  Diabetic ketoacidosis without coma associated with type 2 diabetes mellitus (HCC)  Leukocytosis, unspecified type  Lactic acidosis  Upper GI bleed  Sepsis, due to unspecified organism, unspecified whether acute organ dysfunction present [facility])    Rx / [place] Orders ED Discharge Orders    None         Eustaquio Maize, PA-C 12/04/19 1522    Lennice Sites, DO 12/04/19 1538

## 2019-12-04 NOTE — H&P (Signed)
History and Physical    Katie Lozano:[id number] DOB: 06/24/1941 DOA: 12/04/2019  PCP: Bernerd Limbo, MD (Confirm with patient/family/NH records and if not entered, this has to be entered at [facility] point of entry) Patient coming from: Rehab  I have personally briefly reviewed patient's old medical records in [place]  Chief Complaint: Coffee-ground emesis  HPI: Katie Lozano is a 79 y.o. female with medical history significant of CVA, insulin-dependent diabetes mellitus, hyperlipidemia, anemia, GI bleed, GERD, mild dementia who presented from [place] presented with increasing confusion. of generalized weakness and coffee ground vomiting. She recently was diagnosed with ESBL UTI and was started on Ertapenum some time last week. EMS was told that that patient has had coffee-ground emesis x2 days.  She had been refusing to take any of her medications including her insulin.  Tthis morning patient was found to be more confused than normal but there was no fever or chills on Rehab records and no diarrhea.    ED Course: Glucose 190, AKI with creatinine 1.6 compared to 3.7 last month, bicarb 14, WBC more than 20, UA showed WBC 6-10  Review of Systems: Unable to perform, pt demented  Past Medical History:  Diagnosis Date  . Allergy   . Anemia associated with acute blood loss 06/2016  . Colon polyp   . Diabetes mellitus without complication ([place])   . Diverticulitis   . DKA (diabetic ketoacidoses) ([place]) 07/11/2016  . Esophageal necrosis   . GERD (gastroesophageal reflux disease)   . Hematemesis 07/11/2016  . Hiatal hernia   . Hyperlipidemia   . Melena 06/2016  . Stroke ([place])   . UGIB (upper gastrointestinal bleed) 06/2016  . Ulcerative esophagitis     Past Surgical History:  Procedure Laterality Date  . APPENDECTOMY  1956  . CHOLECYSTECTOMY  1997  . ESOPHAGOGASTRODUODENOSCOPY N/A 07/14/2016   Procedure: ESOPHAGOGASTRODUODENOSCOPY (EGD);  Surgeon: Manus Gunning, MD;  Location: [facility];  Service: Gastroenterology;  Laterality: N/A;  at bedside  . TONSILLECTOMY     age 56     reports that she quit smoking about 7 years ago. Her smoking use included cigarettes. She has never used smokeless tobacco. She reports that she does not drink alcohol or use drugs.  Allergies  Allergen Reactions  . Trulicity [Dulaglutide] Diarrhea    Family History  Problem Relation Age of Onset  . Heart disease Mother   . Melanoma Mother   . Stroke Mother   . Heart disease Father        MI  . Diabetes Father   . Colon cancer Maternal Uncle   . Colon polyps Maternal Uncle     Prior to Admission medications   Medication Sig Start Date End Date Taking? Authorizing Provider  aluminum-magnesium hydroxide-simethicone (MAALOX) 212-248-25 MG/5ML SUSP Take 30 mLs by mouth every 4 (four) hours as needed (heartburn).   Yes [provider]  cholecalciferol (VITAMIN D3) 25 MCG (1000 UT) tablet Take 2,000 Units by mouth at bedtime.   Yes [provider]  clopidogrel (PLAVIX) 75 MG tablet Take 1 tablet (75 mg total) by mouth daily. 09/19/16  Yes Reyne Dumas, MD  divalproex (DEPAKOTE) 250 MG DR tablet Take 1 tablet (250 mg total) by mouth 2 (two) times daily. 05/16/18 12/04/19 Yes Amin, Ankit Chirag, MD  ferrous sulfate 325 (65 FE) MG tablet Take 325 mg by mouth daily.   Yes [provider]  insulin aspart (NOVOLOG) 100 UNIT/ML injection Inject 4-10 Units into  the skin See admin instructions. Inject 4-10 units subcutaneously before meals and at bedtime per sliding scale: CBG 0-250 0 units, 251-350 4 units, 351-450 6 units, 451-550 8 units, 551-600 10 units and call provider 07/22/16  Yes [provider]  insulin glargine (LANTUS) 100 UNIT/ML injection Inject 0.35 mLs (35 Units total) into the skin at bedtime. Patient taking differently: Inject 41 Units into the skin at bedtime.  09/18/16  Yes Reyne Dumas, MD  loperamide (IMODIUM A-D)  2 MG tablet Take 2-4 mg by mouth See admin instructions. Take 2 tablets (4 mg) by mouth as needed for diarrhea (1st dose), then take 1 tablet (2 mg) every 6 hours as needed for diarrhea - max 6 tablets in 24 hours   Yes [provider]  melatonin 3 MG TABS tablet Take 6 mg by mouth at bedtime.   Yes [provider]  metoprolol succinate (TOPROL-XL) 25 MG 24 hr tablet Take 1 tablet (25 mg total) by mouth daily. 09/19/16  Yes Reyne Dumas, MD  olopatadine (PATANOL) 0.1 % ophthalmic solution Place 1 drop into both eyes 2 (two) times daily. For dry eyes   Yes [provider]  pantoprazole (PROTONIX) 40 MG tablet Take 1 tablet (40 mg total) daily by mouth. Switch for any other PPI at similar dose and frequency 05/05/17  Yes Armbruster, Carlota Raspberry, MD  potassium chloride SA (K-DUR,KLOR-CON) 20 MEQ tablet Take 1 tablet (20 mEq total) by mouth daily. 09/18/16  Yes Reyne Dumas, MD  promethazine (PHENERGAN) 25 MG suppository Place 25 mg rectally every 4 (four) hours as needed for nausea (per standing order).   Yes [provider]  Semaglutide, 1 MG/DOSE, (OZEMPIC, 1 MG/DOSE,) 4 MG/3ML SOPN Inject 1 mg into the skin every Thursday.   Yes [provider]  simvastatin (ZOCOR) 20 MG tablet Take 20 mg by mouth at bedtime.  02/24/15  Yes [provider]    Physical Exam: Vitals:   12/04/19 1315 12/04/19 1330 12/04/19 1430 12/04/19 1445  BP: (!) 130/51 (!) 130/56 (!) 143/63 (!) 142/58  Pulse: 98 (!) 101 (!) 101 98  Resp: (!) 25 18 (!) 31 (!) 29  Temp:      TempSrc:      SpO2: 97% 96% 96% 94%    Constitutional: NAD, calm, comfortable Vitals:   12/04/19 1315 12/04/19 1330 12/04/19 1430 12/04/19 1445  BP: (!) 130/51 (!) 130/56 (!) 143/63 (!) 142/58  Pulse: 98 (!) 101 (!) 101 98  Resp: (!) 25 18 (!) 31 (!) 29  Temp:      TempSrc:      SpO2: 97% 96% 96% 94%   Eyes: PERRL, lids and conjunctivae normal ENMT: Mucous membranes are moist. Posterior pharynx  clear of any exudate or lesions.Normal dentition.  Neck: normal, supple, no masses, no thyromegaly Respiratory: clear to auscultation bilaterally, no wheezing, no crackles. Normal respiratory effort. No accessory muscle use.  Cardiovascular: Regular rate and rhythm, no murmurs / rubs / gallops. No extremity edema. 2+ pedal pulses. No carotid bruits.  Abdomen: no tenderness, no masses palpated. No hepatosplenomegaly. Bowel sounds positive.  Musculoskeletal: no clubbing / cyanosis. No joint deformity upper and lower extremities. Good ROM, no contractures. Normal muscle tone.  Skin: no rashes, lesions, ulcers. No induration, PICC line insertion site clean, no rash Neurologic: Demented, follows simple command  Psychiatric: Demented   Labs on Admission: I have personally reviewed following labs and imaging studies  CBC: Recent Labs  Lab 12/04/19 1122 12/04/19 1227  WBC 20.3*  --   NEUTROABS 17.3*  --   HGB 14.6 13.9  HCT 44.4 41.0  MCV 101.4*  --   PLT 290  --    Basic Metabolic Panel: Recent Labs  Lab 12/04/19 1122 12/04/19 1227  NA 139 142  K 4.9 4.7  CL 104  --   CO2 14*  --   GLUCOSE 490*  --   BUN 47*  --   CREATININE 1.64*  --   CALCIUM 8.9  --    GFR: CrCl cannot be calculated (Unknown ideal weight.). Liver Function Tests: Recent Labs  Lab 12/04/19 1122  AST 18  ALT 13  ALKPHOS 73  BILITOT 1.6*  PROT 6.3*  ALBUMIN 3.0*   Recent Labs  Lab 12/04/19 1122  LIPASE 17   No results for input(s): AMMONIA in the last 168 hours. Coagulation Profile: Recent Labs  Lab 12/04/19 1153  INR 1.3*   Cardiac Enzymes: No results for input(s): CKTOTAL, CKMB, CKMBINDEX, TROPONINI in the last 168 hours. BNP (last 3 results) No results for input(s): PROBNP in the last 8760 hours. HbA1C: No results for input(s): HGBA1C in the last 72 hours. CBG: Recent Labs  Lab 12/04/19 1124 12/04/19 1149 12/04/19 1423  GLUCAP 465* 451* 307*   Lipid Profile: No results for  input(s): CHOL, HDL, LDLCALC, TRIG, CHOLHDL, LDLDIRECT in the last 72 hours. Thyroid Function Tests: No results for input(s): TSH, T4TOTAL, FREET4, T3FREE, THYROIDAB in the last 72 hours. Anemia Panel: No results for input(s): VITAMINB12, FOLATE, FERRITIN, TIBC, IRON, RETICCTPCT in the last 72 hours. Urine analysis:    Component Value Date/Time   COLORURINE STRAW (A) 12/04/2019 1425   APPEARANCEUR CLEAR 12/04/2019 1425   LABSPEC 1.022 12/04/2019 1425   PHURINE 5.0 12/04/2019 1425   GLUCOSEU >=500 (A) 12/04/2019 1425   HGBUR NEGATIVE 12/04/2019 1425   BILIRUBINUR NEGATIVE 12/04/2019 1425   KETONESUR 80 (A) 12/04/2019 1425   PROTEINUR NEGATIVE 12/04/2019 1425   NITRITE NEGATIVE 12/04/2019 1425   LEUKOCYTESUR NEGATIVE 12/04/2019 1425    Radiological Exams on Admission: CT ABDOMEN PELVIS WO CONTRAST  Result Date: 12/04/2019 CLINICAL DATA:  GI bleeding. Coffee ground emesis for 2 days. Patient is lethargic and loosening. EXAM: CT ABDOMEN AND PELVIS WITHOUT CONTRAST TECHNIQUE: Multidetector CT imaging of the abdomen and pelvis was performed following the standard protocol without IV contrast. COMPARISON:  CT of the abdomen and pelvis on 10/08/2019 FINDINGS: Lower chest: Bibasilar atelectasis. 5 millimeter nodule is identified in the RIGHT UPPER lobe on image 3 of series 5. This portion of the lung was not imaged on the previous exam. There is bibasilar atelectasis, RIGHT greater than LEFT. Scattered emphysematous changes are present. There is dense atherosclerotic calcification of the coronary arteries. Atherosclerotic calcification of the thoracic aorta. Hepatobiliary: Cholecystectomy.  Liver is unremarkable. Pancreas: Unremarkable. No pancreatic ductal dilatation or surrounding inflammatory changes. Spleen: Normal in size without focal abnormality. Adrenals/Urinary Tract: Adrenal glands are normal. Intrarenal calculus in the midpole region of the RIGHT kidney is 1.8 centimeters. A punctate  intrarenal calculus is identified in the LOWER pole region of the RIGHT kidney. A punctate intrarenal stone is identified in the LOWER pole the LEFT kidney, 2 millimeters. No hydronephrosis or renal mass. Ureters are unremarkable. The bladder and visualized portion of the urethra are normal. Stomach/Bowel: Stomach is normal in appearance. A radiopaque density in the dependent aspect of the mid stomach likely represents ingested tablet. Small bowel loops are normal in caliber and wall thickness. There are  innumerable colonic diverticula not associated with inflammatory changes. Colon is otherwise normal in appearance. The appendix is not seen. Vascular/Lymphatic: There is extensive atherosclerotic calcification of the abdominal aorta not associated aneurysm. There is atherosclerosis at the origin of the superior mesenteric and celiac axis. Reproductive: Uterus is present.  No adnexal mass. Other: No abdominal wall hernia or abnormality. No abdominopelvic ascites. Musculoskeletal: Degenerative changes are seen in the LOWER lumbar spine. Remote wedge compression fracture of T9. IMPRESSION: 1. No evidence for acute abnormality of the abdomen or pelvis. 2. Extensive colonic diverticulosis without evidence for acute diverticulitis. 3. Nonobstructing bilateral intrarenal calculi. 4. Cholecystectomy. 5. Remote wedge compression fracture of T9. 6. Coronary artery disease. 7. 5 millimeter nodule in the RIGHT UPPER lobe. No follow-up needed if patient is low-risk. Non-contrast chest CT can be considered in 12 months if patient is high-risk. This recommendation follows the consensus statement: Guidelines for Management of Incidental Pulmonary Nodules Detected on CT Images: From the Fleischner Society 2017; Radiology 2017; 284:228-243. 8. Aortic Atherosclerosis (ICD10-I70.0). 9.  Emphysema (ICD10-J43.9). Electronically Signed   By: Nolon Nations M.D.   On: 12/04/2019 14:08   CT Head Wo Contrast  Result Date:  12/04/2019 CLINICAL DATA:  Mental status changes of uncertain etiology; nausea and vomiting with coffee-ground emesis for 2 days, lethargic EXAM: CT HEAD WITHOUT CONTRAST TECHNIQUE: Contiguous axial images were obtained from the base of the skull through the vertex without intravenous contrast. Sagittal and coronal MPR images reconstructed from axial data set. Motion artifacts at skull base COMPARISON:  10/16/2019 FINDINGS: Brain: Generalized atrophy. Normal ventricular morphology. No midline shift or mass effect. Small vessel chronic ischemic changes of deep cerebral white matter. Small old RIGHT parietal infarct at vertex. No intracranial hemorrhage, mass lesion, evidence of acute infarction, or extra-axial fluid collection. Vascular: Atherosclerotic calcifications of internal carotid and vertebral arteries at skull base. Skull: Skull base limited in assessment due to motion artifacts. Remaining calvaria intact Sinuses/Orbits: Grossly clear Other: N/A IMPRESSION: Atrophy with small vessel chronic ischemic changes of deep cerebral white matter. Small old high RIGHT parietal infarct. No acute intracranial abnormalities. Electronically Signed   By: Lavonia Dana M.D.   On: 12/04/2019 14:02   DG Chest Portable 1 View  Result Date: 12/04/2019 CLINICAL DATA:  Coffee-ground emesis for 2 days. EXAM: PORTABLE CHEST 1 VIEW COMPARISON:  10/16/2019 FINDINGS: Shallow lung inflation. Heart is mildly enlarged. Coronary stent partially imaged. There is mild perihilar opacity consistent with mild edema. A nodule overlying the RIGHT mid lung zone is consistent with 5 millimeter nodule identified in the RIGHT UPPER lobe on CT of the same day. There are no frank consolidations. No pneumothorax or pleural effusion. IMPRESSION: 1. Mild pulmonary edema. 2. RIGHT pulmonary nodule. Electronically Signed   By: Nolon Nations M.D.   On: 12/04/2019 14:10    EKG: Independently reviewed. Chronic LBBB  Assessment/Plan Active Problems:    DKA (diabetic ketoacidoses) (HCC)   DKA -Received IV boluses and start insulin drip -PCU admission, patient condition complicated, likely will need more than 2 days hospital stay. -BMP every 4 hours  ESBL UTI with Leukocytosis -Seldom UTI cause this level of WBC count, there might be a hemoconcentration, will check CBC after hydration. -According to ED physician who talked to nursing home staff patient has been on ertapenem for ESBL UTI, will continue for now.  Called and left a message to nursing home staff regarding treatment length, waiting for a call back -Patient also has history of recurrent C. difficile  colitis, however looks like there is no diarrhea currently.  Coffee-ground vomiting -No significant H&H drop, will recheck H&H tonight, transfuse only if patient become symptomatic or hemoglobin below 8 -Hold chemical DVT prophylaxis, SCD for now -Double dose PPI -ED physician talked to patient sister regarding EGD, sister to discuss this issue with other family member, family agree with EGD, will consult GI.  Hx of recurrent C diff -Currently no diarrhea, and patient also has history of C. difficile colonization -Check C. difficile if patient has diarrhea  HTN -Hold BP meds for now.  AKI on CKD stage II - she is dry, and she has received IV boluses, will continue IV fluid.  Advanced dementia -She has a DNR form in her file and CODE STATUS confirmed with patient sister.  DVT prophylaxis: SCD Code Status: DNR Family Communication: Sister Disposition Plan: Compli Consults called: None Admission status: PCU  Lequita Halt MD [facility] Pager [phone]    12/04/2019, 3:17 PM

## 2019-12-04 NOTE — ED Notes (Signed)
Attempted to call report to 2W 

## 2019-12-05 DIAGNOSIS — E1110 Type 2 diabetes mellitus with ketoacidosis without coma: Principal | ICD-10-CM

## 2019-12-05 LAB — GLUCOSE, CAPILLARY
Glucose-Capillary: 102 mg/dL — ABNORMAL HIGH (ref 70–99)
Glucose-Capillary: 105 mg/dL — ABNORMAL HIGH (ref 70–99)
Glucose-Capillary: 109 mg/dL — ABNORMAL HIGH (ref 70–99)
Glucose-Capillary: 109 mg/dL — ABNORMAL HIGH (ref 70–99)
Glucose-Capillary: 112 mg/dL — ABNORMAL HIGH (ref 70–99)
Glucose-Capillary: 112 mg/dL — ABNORMAL HIGH (ref 70–99)
Glucose-Capillary: 113 mg/dL — ABNORMAL HIGH (ref 70–99)
Glucose-Capillary: 113 mg/dL — ABNORMAL HIGH (ref 70–99)
Glucose-Capillary: 115 mg/dL — ABNORMAL HIGH (ref 70–99)
Glucose-Capillary: 115 mg/dL — ABNORMAL HIGH (ref 70–99)
Glucose-Capillary: 118 mg/dL — ABNORMAL HIGH (ref 70–99)
Glucose-Capillary: 118 mg/dL — ABNORMAL HIGH (ref 70–99)
Glucose-Capillary: 122 mg/dL — ABNORMAL HIGH (ref 70–99)
Glucose-Capillary: 127 mg/dL — ABNORMAL HIGH (ref 70–99)
Glucose-Capillary: 132 mg/dL — ABNORMAL HIGH (ref 70–99)
Glucose-Capillary: 97 mg/dL (ref 70–99)

## 2019-12-05 LAB — CBC WITH DIFFERENTIAL/PLATELET
Abs Immature Granulocytes: 0.08 10*3/uL — ABNORMAL HIGH (ref 0.00–0.07)
Basophils Absolute: 0.1 10*3/uL (ref 0.0–0.1)
Basophils Relative: 0 %
Eosinophils Absolute: 0 10*3/uL (ref 0.0–0.5)
Eosinophils Relative: 0 %
HCT: 35.8 % — ABNORMAL LOW (ref 36.0–46.0)
Hemoglobin: 12.3 g/dL (ref 12.0–15.0)
Immature Granulocytes: 1 %
Lymphocytes Relative: 13 %
Lymphs Abs: 2.2 10*3/uL (ref 0.7–4.0)
MCH: 33.3 pg (ref 26.0–34.0)
MCHC: 34.4 g/dL (ref 30.0–36.0)
MCV: 97 fL (ref 80.0–100.0)
Monocytes Absolute: 1.4 10*3/uL — ABNORMAL HIGH (ref 0.1–1.0)
Monocytes Relative: 8 %
Neutro Abs: 12.9 10*3/uL — ABNORMAL HIGH (ref 1.7–7.7)
Neutrophils Relative %: 78 %
Platelets: 209 10*3/uL (ref 150–400)
RBC: 3.69 MIL/uL — ABNORMAL LOW (ref 3.87–5.11)
RDW: 13.2 % (ref 11.5–15.5)
WBC: 16.6 10*3/uL — ABNORMAL HIGH (ref 4.0–10.5)
nRBC: 0 % (ref 0.0–0.2)

## 2019-12-05 LAB — VITAMIN B12: Vitamin B-12: 471 pg/mL (ref 180–914)

## 2019-12-05 LAB — BASIC METABOLIC PANEL
Anion gap: 8 (ref 5–15)
Anion gap: 8 (ref 5–15)
Anion gap: 9 (ref 5–15)
BUN: 34 mg/dL — ABNORMAL HIGH (ref 8–23)
BUN: 31 mg/dL — ABNORMAL HIGH (ref 8–23)
BUN: 26 mg/dL — ABNORMAL HIGH (ref 8–23)
CO2: 19 mmol/L — ABNORMAL LOW (ref 22–32)
CO2: 18 mmol/L — ABNORMAL LOW (ref 22–32)
CO2: 17 mmol/L — ABNORMAL LOW (ref 22–32)
Calcium: 8.5 mg/dL — ABNORMAL LOW (ref 8.9–10.3)
Calcium: 8.5 mg/dL — ABNORMAL LOW (ref 8.9–10.3)
Calcium: 8.5 mg/dL — ABNORMAL LOW (ref 8.9–10.3)
Chloride: 118 mmol/L — ABNORMAL HIGH (ref 98–111)
Chloride: 117 mmol/L — ABNORMAL HIGH (ref 98–111)
Chloride: 117 mmol/L — ABNORMAL HIGH (ref 98–111)
Creatinine, Ser: 0.81 mg/dL (ref 0.44–1.00)
Creatinine, Ser: 0.76 mg/dL (ref 0.44–1.00)
Creatinine, Ser: 0.7 mg/dL (ref 0.44–1.00)
GFR calc Af Amer: >60 mL/min (ref >60)
GFR calc Af Amer: >60 mL/min (ref >60)
GFR calc Af Amer: >60 mL/min (ref >60)
GFR calc non Af Amer: >60 mL/min (ref >60)
GFR calc non Af Amer: >60 mL/min (ref >60)
GFR calc non Af Amer: >60 mL/min (ref >60)
Glucose, Bld: 137 mg/dL — ABNORMAL HIGH (ref 70–99)
Glucose, Bld: 141 mg/dL — ABNORMAL HIGH (ref 70–99)
Glucose, Bld: 119 mg/dL — ABNORMAL HIGH (ref 70–99)
Potassium: 4.3 mmol/L (ref 3.5–5.1)
Potassium: 3.4 mmol/L — ABNORMAL LOW (ref 3.5–5.1)
Potassium: 3.4 mmol/L — ABNORMAL LOW (ref 3.5–5.1)
Sodium: 145 mmol/L (ref 135–145)
Sodium: 143 mmol/L (ref 135–145)
Sodium: 143 mmol/L (ref 135–145)

## 2019-12-05 LAB — TSH: TSH: 0.782 u[IU]/mL (ref 0.350–4.500)

## 2019-12-05 LAB — T4, FREE: Free T4: 1.3 ng/dL — ABNORMAL HIGH (ref 0.61–1.12)

## 2019-12-05 LAB — BETA-HYDROXYBUTYRIC ACID
Beta-Hydroxybutyric Acid: 0.43 mmol/L — ABNORMAL HIGH (ref 0.05–0.27)
Beta-Hydroxybutyric Acid: 0.47 mmol/L — ABNORMAL HIGH (ref 0.05–0.27)

## 2019-12-05 LAB — AMMONIA: Ammonia: 18 umol/L (ref 9–35)

## 2019-12-05 LAB — LACTIC ACID, PLASMA: Lactic Acid, Venous: 1.8 mmol/L (ref 0.5–1.9)

## 2019-12-05 MED ORDER — INSULIN ASPART 100 UNIT/ML ~~LOC~~ SOLN
0.0000 [IU] | SUBCUTANEOUS | Status: DC
  Administered 2019-12-06: 2 [IU] via SUBCUTANEOUS
  Administered 2019-12-06 (×3): 1 [IU] via SUBCUTANEOUS
  Administered 2019-12-07 (×2): 2 [IU] via SUBCUTANEOUS
  Administered 2019-12-07: 3 [IU] via SUBCUTANEOUS
  Administered 2019-12-07: 2 [IU] via SUBCUTANEOUS
  Administered 2019-12-07: 3 [IU] via SUBCUTANEOUS
  Administered 2019-12-08: 1 [IU] via SUBCUTANEOUS
  Administered 2019-12-08: 2 [IU] via SUBCUTANEOUS
  Administered 2019-12-08: 3 [IU] via SUBCUTANEOUS
  Administered 2019-12-08 (×2): 2 [IU] via SUBCUTANEOUS
  Administered 2019-12-08: 3 [IU] via SUBCUTANEOUS
  Administered 2019-12-09 (×2): 1 [IU] via SUBCUTANEOUS

## 2019-12-05 MED ORDER — SODIUM CHLORIDE 0.9 % IV SOLN
1.0000 g | INTRAVENOUS | Status: DC
  Administered 2019-12-05: 1000 mg via INTRAVENOUS
  Filled 2019-12-05 (×2): qty 1

## 2019-12-05 MED ORDER — SODIUM CHLORIDE 0.9 % IV SOLN
12.5000 mg | Freq: Three times a day (TID) | INTRAVENOUS | Status: DC | PRN
  Administered 2019-12-06: 12.5 mg via INTRAVENOUS
  Filled 2019-12-05: qty 0.5

## 2019-12-05 MED ORDER — RESOURCE THICKENUP CLEAR PO POWD
ORAL | Status: DC | PRN
  Filled 2019-12-05: qty 125

## 2019-12-05 MED ORDER — SODIUM CHLORIDE 0.9% FLUSH
10.0000 mL | Freq: Two times a day (BID) | INTRAVENOUS | Status: DC
  Administered 2019-12-05 – 2019-12-09 (×6): 10 mL

## 2019-12-05 MED ORDER — LACTATED RINGERS IV SOLN: INTRAVENOUS | Status: DC

## 2019-12-05 MED ORDER — CHLORHEXIDINE GLUCONATE 0.12 % MT SOLN
15.0000 mL | Freq: Four times a day (QID) | OROMUCOSAL | Status: DC
  Administered 2019-12-05 – 2019-12-09 (×9): 15 mL via OROMUCOSAL
  Filled 2019-12-05 (×6): qty 15

## 2019-12-05 MED ORDER — SODIUM CHLORIDE 0.9 % IV SOLN
250.0000 mg | Freq: Two times a day (BID) | INTRAVENOUS | Status: DC
  Administered 2019-12-05 – 2019-12-08 (×7): 250 mg via INTRAVENOUS
  Filled 2019-12-05 (×8): qty 2.5

## 2019-12-05 MED ORDER — SODIUM CHLORIDE 0.9 % IV SOLN
12.5000 mg | Freq: Once | INTRAVENOUS | Status: DC
  Administered 2019-12-05: 12.5 mg via INTRAVENOUS
  Filled 2019-12-05: qty 0.5

## 2019-12-05 MED ORDER — SUCRALFATE 1 GM/10ML PO SUSP: 1.0000 g | Freq: Three times a day (TID) | ORAL | Status: DC

## 2019-12-05 MED ORDER — INSULIN GLARGINE 100 UNIT/ML ~~LOC~~ SOLN
8.0000 [IU] | SUBCUTANEOUS | Status: DC
  Administered 2019-12-05: 8 [IU] via SUBCUTANEOUS
  Filled 2019-12-05 (×3): qty 0.08

## 2019-12-05 NOTE — Progress Notes (Signed)
Pharmacy Antibiotic Note  Katie Lozano is a 79 y.o. female admitted on 12/04/2019 with UTI.  Pharmacy has been consulted for Ertepenem dosing. Per nursing home patient grew ESBL in urine culture and was started on Ertapenem on what appears to be 6/1.     Temp (24hrs), Avg:98.4 F (36.9 C), Min:98.1 F (36.7 C), Max:98.7 F (37.1 C)  Recent Labs  Lab 12/04/19 1122 12/04/19 1234 12/04/19 1441 12/04/19 1935 12/05/19 0100 12/05/19 0545  WBC 20.3*  --   --  21.1*  --   --   CREATININE 1.64*  --  1.35* 1.01* 0.81 0.76  LATICACIDVEN  --  4.8* 4.2*  --   --   --     CrCl cannot be calculated (Unknown ideal weight.).    Allergies  Allergen Reactions   Trulicity [Dulaglutide] Diarrhea    Antimicrobials this admission: 6/6 Ertapenem >>   Dose adjustments this admission:   Microbiology results: ESBL per nursing home   Plan:  - CrCL improved with hydration - Resume Ertapenem 1000mg  IV q24h - Monitor patients renal function and urine output   Thank you for allowing pharmacy to be a part of this patients care.  Katie Lozano, PharmD, [facility] Clinical Pharmacist Please see AMION for all Pharmacists' Contact Phone Numbers 12/05/2019, 8:07 AM

## 2019-12-05 NOTE — Progress Notes (Signed)
BMP available for review, insulin rate 0.8. Blount, NP notified.

## 2019-12-05 NOTE — Progress Notes (Signed)
[facility] Progress Note  Patient: Katie Lozano    BMW:[id number]  DOA: 12/04/2019     Date of Service: the patient was seen and examined on 12/05/2019  Chief Complaint  Patient presents with  . Altered Mental Status  . Hyperglycemia  . Nausea  . Emesis   Brief hospital course: Past medical history of CVA, IDDM, HLD, GI bleed, GERD, dementia.  Presents with confusion and coffee colored emesis.  Found to have DKA.  H&H stable. Currently plan is monitor with current treatment.  Assessment and Plan: 1.  DKA Anion gap closed. Transitioning to subcutaneous insulin. Given poor p.o. intake not using home doses of Lantus. Continue every 4 hours sliding scale for now. LR for fluids  2.  History of ESBL UTI. Leukocytosis Currently on IV ertapenem. Monitor. Monitor in a patient with prior history of C. difficile colitis.  3.  Coffee colored emesis Hemoglobin stable. Currently no vomiting. Continue PPI.  4.  Hiccups As needed Thorazine  5.  Essential hypertension Blood pressure currently soft.  Holding blood pressure medications  6.  Acute kidney injury on chronic kidney disease stage II Continue to monitor.  Avoid nephrotoxic medications.  7.  Dementia Continue to monitor. Concern for dysphagia.  With consult speech therapy  8.  Sacral pressure ulcer POA. Continue foam dressing stage I  Pressure Injury 12/04/19 Sacrum Right;Left;Medial Stage 1 -  Intact skin with non-blanchable redness of a localized area usually over a bony prominence. (Active)  12/04/19 2035  Location: Sacrum  Location Orientation: Right;Left;Medial  Staging: Stage 1 -  Intact skin with non-blanchable redness of a localized area usually over a bony prominence.  Wound Description (Comments):   Present on Admission: Yes     Diet: Dysphagia 1 diet nectar thick DVT Prophylaxis: SCD, pharmacological prophylaxis contraindicated due to Consult for GI bleed   Advance goals of care discussion:  DNR  Family Communication: no family was present at bedside, at the time of interview.   Disposition:  Status is: Inpatient  Remains inpatient appropriate because:Inpatient level of care appropriate due to severity of illness   Dispo: The patient is from: SNF              Anticipated d/c is to: SNF              Anticipated d/c date is: 2 days              Patient currently is not medically stable to d/c.        Subjective: No nausea no vomiting.  Persistent headache observed lethargic and tired.  Incoherent speech due to dry mouth.  Physical Exam:  General: Appear in mild distress, no Rash; Oral Mucosa Clear, dry. Difficult to assess  Abnormal Neck Mass Or lumps, Conjunctiva normal  Cardiovascular: S1 and S2 Present, no Murmur, Respiratory: good respiratory effort, Bilateral Air entry present and Clear to Auscultation, no Crackles, no wheezes Abdomen: Bowel Sound present, Soft and mild tenderness Extremities: no Pedal edema, no calf tenderness Neurology: lethargic and oriented to place and person affect flat in affect. no new focal deficit Gait not checked due to patient safety concerns  Vitals:   12/05/19 0801 12/05/19 1200 12/05/19 1600 12/05/19 1713  BP: (!) 167/59 (!) 160/57 (!) 154/47 (!) 156/45  Pulse: 93 93 96 99  Resp: 16 18 (!) 22 18  Temp:  99.2 F (37.3 C)  98.8 F (37.1 C)  TempSrc:  Axillary  Axillary  SpO2: 95% 95% 94%  93%    Intake/Output Summary (Last 24 hours) at 12/05/2019 1857 Last data filed at 12/05/2019 1700 Gross per 24 hour  Intake 1811.82 ml  Output --  Net 1811.82 ml   There were no vitals filed for this visit.  Data Reviewed: I have personally reviewed and interpreted daily labs, tele strips, imagings as discussed above. I reviewed all nursing notes, pharmacy notes, vitals, pertinent old records I have discussed plan of care as described above with RN and patient/family.  CBC: Recent Labs  Lab 12/04/19 1122 12/04/19 1227  12/04/19 1935 12/05/19 1016  WBC 20.3*  --  21.1* 16.6*  NEUTROABS 17.3*  --   --  12.9*  HGB 14.6 13.9 11.8* 12.3  HCT 44.4 41.0 35.3* 35.8*  MCV 101.4*  --  100.0 97.0  PLT 290  --  238 528   Basic Metabolic Panel: Recent Labs  Lab 12/04/19 1441 12/04/19 1935 12/05/19 0100 12/05/19 0545 12/05/19 1016  NA 143 144 145 143 143  K 4.1 3.6 4.3 3.4* 3.4*  CL 112* 118* 118* 117* 117*  CO2 13* 16* 19* 18* 17*  GLUCOSE 333* 182* 137* 141* 119*  BUN 39* 36* 34* 31* 26*  CREATININE 1.35* 1.01* 0.81 0.76 0.70  CALCIUM 8.2* 8.1* 8.5* 8.5* 8.5*    Studies: No results found.  Scheduled Meds: . chlorhexidine  15 mL Mouth/Throat QID  . Chlorhexidine Gluconate Cloth  6 each Topical Daily  . insulin aspart  0-9 Units Subcutaneous Q4H  . olopatadine  1 drop Both Eyes BID  . pantoprazole (PROTONIX) IV  40 mg Intravenous Q12H  . sodium chloride flush  10-40 mL Intracatheter Q12H   Continuous Infusions: . chlorproMAZINE (THORAZINE) IV    . ertapenem 1,000 mg (12/05/19 1653)  . lactated ringers 75 mL/hr at 12/05/19 0802  . levETIRAcetam 250 mg (12/05/19 1154)   PRN Meds: alum & mag hydroxide-simeth, chlorproMAZINE (THORAZINE) IV, dextrose, Resource ThickenUp Clear  Time spent: 35 minutes  Author: Berle Mull, MD [facility] 12/05/2019 6:57 PM  To reach On-call, see care teams to locate the attending and reach out via [email or web address]. Between 7PM-7AM, please contact night-coverage If you still have difficulty reaching the attending provider, please page the [facility] (Director on Call) for [facility] on amion for assistance.

## 2019-12-05 NOTE — Progress Notes (Signed)
BMP available for review in epic, potassium 3.4. Blount, NP notified.

## 2019-12-05 NOTE — Progress Notes (Signed)
Pt confused, combative and refuses care at this time.

## 2019-12-06 ENCOUNTER — Inpatient Hospital Stay (HOSPITAL_COMMUNITY): Payer: Medicare (Managed Care)

## 2019-12-06 LAB — BASIC METABOLIC PANEL
Anion gap: 9 (ref 5–15)
BUN: 15 mg/dL (ref 8–23)
CO2: 20 mmol/L — ABNORMAL LOW (ref 22–32)
Calcium: 8.1 mg/dL — ABNORMAL LOW (ref 8.9–10.3)
Chloride: 111 mmol/L (ref 98–111)
Creatinine, Ser: 0.69 mg/dL (ref 0.44–1.00)
GFR calc Af Amer: >60 mL/min (ref >60)
GFR calc non Af Amer: >60 mL/min (ref >60)
Glucose, Bld: 142 mg/dL — ABNORMAL HIGH (ref 70–99)
Potassium: 3 mmol/L — ABNORMAL LOW (ref 3.5–5.1)
Sodium: 140 mmol/L (ref 135–145)

## 2019-12-06 LAB — CBC WITH DIFFERENTIAL/PLATELET
Abs Immature Granulocytes: 0.06 10*3/uL (ref 0.00–0.07)
Basophils Absolute: 0.1 10*3/uL (ref 0.0–0.1)
Basophils Relative: 0 %
Eosinophils Absolute: 0 10*3/uL (ref 0.0–0.5)
Eosinophils Relative: 0 %
HCT: 32.3 % — ABNORMAL LOW (ref 36.0–46.0)
Hemoglobin: 11.2 g/dL — ABNORMAL LOW (ref 12.0–15.0)
Immature Granulocytes: 1 %
Lymphocytes Relative: 17 %
Lymphs Abs: 2.3 10*3/uL (ref 0.7–4.0)
MCH: 33.2 pg (ref 26.0–34.0)
MCHC: 34.7 g/dL (ref 30.0–36.0)
MCV: 95.8 fL (ref 80.0–100.0)
Monocytes Absolute: 1.1 10*3/uL — ABNORMAL HIGH (ref 0.1–1.0)
Monocytes Relative: 9 %
Neutro Abs: 9.7 10*3/uL — ABNORMAL HIGH (ref 1.7–7.7)
Neutrophils Relative %: 73 %
Platelets: 172 10*3/uL (ref 150–400)
RBC: 3.37 MIL/uL — ABNORMAL LOW (ref 3.87–5.11)
RDW: 12.9 % (ref 11.5–15.5)
WBC: 13.2 10*3/uL — ABNORMAL HIGH (ref 4.0–10.5)
nRBC: 0 % (ref 0.0–0.2)

## 2019-12-06 LAB — MAGNESIUM: Magnesium: 1.4 mg/dL — ABNORMAL LOW (ref 1.7–2.4)

## 2019-12-06 LAB — GLUCOSE, CAPILLARY
Glucose-Capillary: 138 mg/dL — ABNORMAL HIGH (ref 70–99)
Glucose-Capillary: 108 mg/dL — ABNORMAL HIGH (ref 70–99)
Glucose-Capillary: 153 mg/dL — ABNORMAL HIGH (ref 70–99)
Glucose-Capillary: 140 mg/dL — ABNORMAL HIGH (ref 70–99)
Glucose-Capillary: 124 mg/dL — ABNORMAL HIGH (ref 70–99)
Glucose-Capillary: 115 mg/dL — ABNORMAL HIGH (ref 70–99)

## 2019-12-06 MED ORDER — ACETAMINOPHEN 325 MG PO TABS
650.0000 mg | ORAL_TABLET | Freq: Four times a day (QID) | ORAL | Status: DC | PRN
  Administered 2019-12-06: 650 mg via ORAL
  Filled 2019-12-06: qty 2

## 2019-12-06 MED ORDER — POTASSIUM CHLORIDE CRYS ER 20 MEQ PO TBCR
40.0000 meq | EXTENDED_RELEASE_TABLET | Freq: Once | ORAL | Status: AC
  Administered 2019-12-06: 40 meq via ORAL
  Filled 2019-12-06: qty 2

## 2019-12-06 MED ORDER — ONDANSETRON HCL 4 MG/2ML IJ SOLN: 4.0000 mg | Freq: Four times a day (QID) | INTRAMUSCULAR | Status: DC | PRN

## 2019-12-06 MED ORDER — PANTOPRAZOLE SODIUM 40 MG PO TBEC
40.0000 mg | DELAYED_RELEASE_TABLET | Freq: Every day | ORAL | Status: DC
  Administered 2019-12-07 – 2019-12-09 (×3): 40 mg via ORAL
  Filled 2019-12-06 (×3): qty 1

## 2019-12-06 MED ORDER — POTASSIUM CHLORIDE 10 MEQ/100ML IV SOLN
10.0000 meq | INTRAVENOUS | Status: DC
  Administered 2019-12-06 (×4): 10 meq via INTRAVENOUS
  Filled 2019-12-06 (×3): qty 100

## 2019-12-06 MED ORDER — SODIUM CHLORIDE 0.9 % IV SOLN
1.5000 g | Freq: Four times a day (QID) | INTRAVENOUS | Status: DC
  Administered 2019-12-06 – 2019-12-09 (×13): 1.5 g via INTRAVENOUS
  Filled 2019-12-06: qty 4
  Filled 2019-12-06 (×2): qty 1.5
  Filled 2019-12-06: qty 4
  Filled 2019-12-06 (×4): qty 1.5
  Filled 2019-12-06: qty 4
  Filled 2019-12-06 (×3): qty 1.5
  Filled 2019-12-06: qty 4
  Filled 2019-12-06 (×3): qty 1.5

## 2019-12-06 MED ORDER — MAGNESIUM SULFATE 2 GM/50ML IV SOLN
2.0000 g | Freq: Once | INTRAVENOUS | Status: AC
  Administered 2019-12-06: 2 g via INTRAVENOUS
  Filled 2019-12-06: qty 50

## 2019-12-06 MED ORDER — SACCHAROMYCES BOULARDII 250 MG PO CAPS
250.0000 mg | ORAL_CAPSULE | Freq: Two times a day (BID) | ORAL | Status: DC
  Administered 2019-12-07 – 2019-12-09 (×5): 250 mg via ORAL
  Filled 2019-12-06 (×6): qty 1

## 2019-12-06 NOTE — TOC Progression Note (Signed)
Transition of Care [facility]) - Progression Note    Patient Details  Name: Katie Lozano MRN: [id number] Date of Birth: [birth year]  Transition of Care [facility]) CM/SW [place], [place] Phone Number: 12/06/2019, 4:16 PM  Clinical Narrative:     CSW attempted to meet with pt to discern pts living situation. Pt is not oriented. CSW attempted to contact daughter Carla Drape and left voice message requesting return call.        Expected Discharge Plan and Services                                                 Social Determinants of Health (SDOH) Interventions    Readmission Risk Interventions No flowsheet data found.

## 2019-12-06 NOTE — Progress Notes (Signed)
Pharmacy Antibiotic Note  Katie Lozano is a 79 y.o. female admitted on 12/04/2019 with aspiration pneumonia.  Pharmacy has been consulted for Unasyn dosing.  CrCl approx 47 ml/min  Plan: Unasyn 1.5 gm IV q6hr Monitor renal function, clinical status and C&S.   Temp (24hrs), Avg:99.2 F (37.3 C), Min:98.8 F (37.1 C), Max:100.1 F (37.8 C)  Recent Labs  Lab 12/04/19 1122 12/04/19 1122 12/04/19 1234 12/04/19 1441 12/04/19 1441 12/04/19 1935 12/05/19 0100 12/05/19 0545 12/05/19 1016 12/05/19 1700 12/06/19 0501  WBC 20.3*  --   --   --   --  21.1*  --   --  16.6*  --  13.2*  CREATININE 1.64*   < >  --  1.35*   < > 1.01* 0.81 0.76 0.70  --  0.69  LATICACIDVEN  --   --  4.8* 4.2*  --   --   --   --   --  1.8  --    < > = values in this interval not displayed.    CrCl cannot be calculated (Unknown ideal weight.).    Allergies  Allergen Reactions  . Trulicity [Dulaglutide] Diarrhea    Antimicrobials this admission: Invanz 5/27 >> 6/7 Unasyn 6/7 >>   Thank you for allowing pharmacy to be a part of this patient's care.  Alanda Slim, PharmD, [facility] Clinical Pharmacist Please see AMION for all Pharmacists' Contact Phone Numbers 12/06/2019, 11:20 AM

## 2019-12-06 NOTE — Evaluation (Addendum)
Clinical/Bedside Swallow Evaluation Patient Details  Name: Katie Lozano MRN: [id number] Date of Birth: 1940-08-18  Today's Date: 12/06/2019 Time: SLP Start Time (ACUTE ONLY): 0948 SLP Stop Time (ACUTE ONLY): 1010 SLP Time Calculation (min) (ACUTE ONLY): 22 min  Past Medical History:  Past Medical History:  Diagnosis Date  . Allergy   . Anemia associated with acute blood loss 06/2016  . Colon polyp   . Diabetes mellitus without complication ([place])   . Diverticulitis   . DKA (diabetic ketoacidoses) ([place]) 07/11/2016  . Esophageal necrosis   . GERD (gastroesophageal reflux disease)   . Hematemesis 07/11/2016  . Hiatal hernia   . Hyperlipidemia   . Melena 06/2016  . Stroke ([place])   . UGIB (upper gastrointestinal bleed) 06/2016  . Ulcerative esophagitis    Past Surgical History:  Past Surgical History:  Procedure Laterality Date  . APPENDECTOMY  1956  . CHOLECYSTECTOMY  1997  . ESOPHAGOGASTRODUODENOSCOPY N/A 07/14/2016   Procedure: ESOPHAGOGASTRODUODENOSCOPY (EGD);  Surgeon: Manus Gunning, MD;  Location: [facility];  Service: Gastroenterology;  Laterality: N/A;  at bedside  . TONSILLECTOMY     age 45   HPI:  Katie Lozano is a 79 y.o. female with medical history significant of CVA, insulin-dependent diabetes mellitus,GERDR, hiatal hernia, hyperlipidemia, anemia, GI bleed, mild dementia who presented from [place] presented with increasing confusion. of generalized weakness and coffee ground vomiting. Per note, recent dx of  UTI. Found to be in DKA. Per notes re: coffee ground emesis hemoglobin stable, no vomiting and continue PPI. CXR 6/6 . RIGHT pulmonary nodule, mild pulm edema. MD concerned with dysphagia and ordered nectar thick liquids and puree. No prior ST notes for swallow found.    Assessment / Plan / Recommendation Clinical Impression  Pt exhibits a cognitive based dysphagia from limited assessment and subjective assessment. Consumed thin via straw and  cup with additional time, hand over hand and relatively timely transit with liquids. Obvious dislike of applesauce therefore pudding retrieved. This appeared less noxious however bolus held in oral cavity and despite strategies to assist in propulsion, pt expectorated bolus. RN tech reported little intake this morning and expectoration of medicine in applesauce with RN. If pt has not had Palliative care consult before this may assist in longer term planning as her dementia progresses. MBS is not indicated at present- suspect pt would hold or expectorate boluses. Will follow up short term.  Pt has 2 upper anterior teeth that are significantly loose.  SLP Visit Diagnosis: Dysphagia, unspecified (R13.10)    Aspiration Risk  Moderate aspiration risk    Diet Recommendation Nectar-thick liquid;Dysphagia 1 (Puree)   Liquid Administration via: Cup;Straw Medication Administration: Crushed with puree Supervision: Staff to assist with self feeding;Full supervision/cueing for compensatory strategies Compensations: Minimize environmental distractions;Slow rate;Small sips/bites;Lingual sweep for clearance of pocketing Postural Changes: Seated upright at 90 degrees;Remain upright for at least 30 minutes after po intake    Other  Recommendations Oral Care Recommendations: Oral care QID   Follow up Recommendations Skilled Nursing facility      Frequency and Duration min 2x/week  2 weeks       Prognosis Prognosis for Safe Diet Advancement: Fair Barriers to Reach Goals: Cognitive deficits      Swallow Study   General HPI: Katie Lozano is a 79 y.o. female with medical history significant of CVA, insulin-dependent diabetes mellitus,GERDR, hiatal hernia, hyperlipidemia, anemia, GI bleed, mild dementia who presented from [place] presented with increasing confusion. of generalized weakness  and coffee ground vomiting. Per note, recent dx of  UTI. Found to be in DKA. Per notes re: coffee ground  emesis hemoglobin stable, no vomiting and continue PPI. CXR 6/6 . RIGHT pulmonary nodule, mild pulm edema. MD concerned with dysphagia and ordered nectar thick liquids and puree. No prior ST notes for swallow found.  Type of Study: Bedside Swallow Evaluation Previous Swallow Assessment: none Diet Prior to this Study: Dysphagia 1 (puree);Nectar-thick liquids Temperature Spikes Noted: Yes Respiratory Status: Room air History of Recent Intubation: No Behavior/Cognition: Distractible;Requires cueing;Other (Comment)(moderately alert) Oral Cavity Assessment: Other (comment)(phlegm, residue from breakfast) Oral Care Completed by SLP: Yes Oral Cavity - Dentition: Other (Comment)(has 2 upper teeth that are significantly loose) Vision: (?) Self-Feeding Abilities: Total assist Patient Positioning: Upright in bed Baseline Vocal Quality: Normal Volitional Cough: Cognitively unable to elicit Volitional Swallow: Unable to elicit    Oral/Motor/Sensory Function Overall Oral Motor/Sensory Function: (no focal impairments)   Ice Chips Ice chips: Not tested   Thin Liquid Thin Liquid: Impaired Oral Phase Impairments: Reduced lingual movement/coordination Oral Phase Functional Implications: (functional) Pharyngeal  Phase Impairments: Other (comments)(no overt)    Nectar Thick Nectar Thick Liquid: Not tested   Honey Thick Honey Thick Liquid: Not tested   Puree Puree: Impaired Oral Phase Impairments: Reduced lingual movement/coordination Oral Phase Functional Implications: Oral holding;Other (comment)(anterior aggregation) Pharyngeal Phase Impairments: (expectorated)   Solid     Solid: Not tested      Houston Siren 12/06/2019,10:45 AM   lumbosacral

## 2019-12-06 NOTE — Progress Notes (Addendum)
[facility] Progress Note  Patient: ROCIO Lozano    STM:[id number]  DOA: 12/04/2019     Date of Service: the patient was seen and examined on 12/06/2019  Chief Complaint  Patient presents with  . Altered Mental Status  . Hyperglycemia  . Nausea  . Emesis   Brief hospital course: Past medical history of CVA, IDDM, HLD, GI bleed, GERD, dementia.  Presents with confusion and coffee colored emesis.  Found to have DKA.  H&H stable. Currently plan is monitor with current treatment.  Assessment and Plan: 1.  DKA Anion gap closed. Transitioning to subcutaneous insulin. Given poor p.o. intake not using home doses of Lantus. Continue sliding scale for now. LR for fluids, stopped due to concerns for volume overload on 12/06/2019.  2.  History of ESBL UTI. Currently on IV ertapenem.  I called the SNF, patient was diagnosed with ESBL UTI on 11/24/2019. Supposed to finish ertapenem on 12/05/2019.  3.  Coffee colored emesis Hemoglobin stable. Currently no vomiting or bleeding or melena. Continue PPI.  4.  Hiccups As needed Thorazine  5.  Essential hypertension Blood pressure currently soft.  Holding blood pressure medications  6.  Acute kidney injury on chronic kidney disease stage II Continue to monitor.  Avoid nephrotoxic medications. Renal function back to baseline.  7.  Dementia Dysphagia Continue to monitor. Concern for dysphagia.   consult speech therapy, currently on dysphagia 1 nectar thick liquid diet.  Concern for dehydration given her noncompliance with insulin regimen with resultant hyper glycemia.  8.  Aspiration pneumonia Fever. Lethargy. Leukocytosis. Clinically patient still appears to be dealing with infection. Chest x-ray on 12/06/2019 shows airspace opacity in the right in the middle lung zone consistent with pneumonia. Patient had low-grade fever. Leukocytosis is actually improving but that is likely from hemoconcentration. Blood cultures  performed. Antibiotics changed to Unasyn. Continue Florastor.  9.  Hypokalemia. Hypomagnesemia. Replacing.  10. Sacral pressure ulcer POA. Continue foam dressing stage I  Pressure Injury 12/04/19 Sacrum Right;Left;Medial Stage 1 -  Intact skin with non-blanchable redness of a localized area usually over a bony prominence. (Active)  12/04/19 2035  Location: Sacrum  Location Orientation: Right;Left;Medial  Staging: Stage 1 -  Intact skin with non-blanchable redness of a localized area usually over a bony prominence.  Wound Description (Comments):   Present on Admission: Yes    Diet: Dysphagia 1 diet nectar thick DVT Prophylaxis: SCD, pharmacological prophylaxis contraindicated due to Consult for GI bleed   Advance goals of care discussion: DNR  Family Communication: Sister was present at bedside, at the time of interview.  Discussed current plan of care. Discussed future poor prognosis given her aspiration, dementia with likely resultant progressive decline. Patient had goals of care discussion at SNF at initial intake, patient will benefit from ongoing palliative care consult decision at the SNF.  Elder daughter and sister at the [place].  Disposition:  Status is: Inpatient  Remains inpatient appropriate because:Inpatient level of care appropriate due to severity of illness   Dispo: The patient is from: SNF              Anticipated d/c is to: SNF              Anticipated d/c date is: 2 days              Patient currently is not medically stable to d/c.  Subjective: Denies any acute complaint.  No nausea no vomiting.  Was tachycardic earlier in the morning.  Tachypneic right now.  Physical Exam:  General: Appear in moderate distress, no Rash; Oral Mucosa Clear, dry. Difficult to assess  Abnormal Neck Mass Or lumps, Conjunctiva normal  Cardiovascular: S1 and S2 Present, no Murmur, Respiratory: Increased respiratory effort, Bilateral Air entry present and right more than left  crackles, no wheezes Abdomen: Bowel Sound present, Soft and no tenderness Extremities: no Pedal edema, no calf tenderness Neurology: Alert but fatigued and oriented to place and person affect flat in affect. no new focal deficit Gait not checked due to patient safety concerns  Vitals:   12/05/19 1910 12/06/19 0103 12/06/19 0320 12/06/19 0756  BP:  (!) 161/67 (!) 161/61 (!) 148/54  Pulse:      Resp:  18 (!) 22 19  Temp: 98.8 F (37.1 C) 99.1 F (37.3 C)  100.1 F (37.8 C)  TempSrc: Axillary Axillary  Axillary  SpO2:  93% 94% 97%    Intake/Output Summary (Last 24 hours) at 12/06/2019 1705 Last data filed at 12/06/2019 0600 Gross per 24 hour  Intake 875 ml  Output 300 ml  Net 575 ml   There were no vitals filed for this visit.  Data Reviewed: I have personally reviewed and interpreted daily labs, tele strips, imagings as discussed above. I reviewed all nursing notes, pharmacy notes, vitals, pertinent old records I have discussed plan of care as described above with RN and patient/family.  CBC: Recent Labs  Lab 12/04/19 1122 12/04/19 1227 12/04/19 1935 12/05/19 1016 12/06/19 0501  WBC 20.3*  --  21.1* 16.6* 13.2*  NEUTROABS 17.3*  --   --  12.9* 9.7*  HGB 14.6 13.9 11.8* 12.3 11.2*  HCT 44.4 41.0 35.3* 35.8* 32.3*  MCV 101.4*  --  100.0 97.0 95.8  PLT 290  --  238 209 921   Basic Metabolic Panel: Recent Labs  Lab 12/04/19 1935 12/05/19 0100 12/05/19 0545 12/05/19 1016 12/06/19 0501  NA 144 145 143 143 140  K 3.6 4.3 3.4* 3.4* 3.0*  CL 118* 118* 117* 117* 111  CO2 16* 19* 18* 17* 20*  GLUCOSE 182* 137* 141* 119* 142*  BUN 36* 34* 31* 26* 15  CREATININE 1.01* 0.81 0.76 0.70 0.69  CALCIUM 8.1* 8.5* 8.5* 8.5* 8.1*  MG  --   --   --   --  1.4*    Studies: DG CHEST PORT 1 VIEW  Result Date: 12/06/2019 CLINICAL DATA:  Confusion and emesis EXAM: PORTABLE CHEST 1 VIEW COMPARISON:  December 04, 2019 FINDINGS: There is airspace opacity in portions of the right mid and  lower lung regions. Lungs elsewhere are clear. Heart size and pulmonary vascularity are normal. No adenopathy. No bone lesions. IMPRESSION: Airspace opacity in portions of the right mid and lower lung zones consistent with pneumonia. Lungs elsewhere clear. Cardiac silhouette within normal limits. Electronically Signed   By: Lowella Grip III M.D.   On: 12/06/2019 11:19    Scheduled Meds: . chlorhexidine  15 mL Mouth/Throat QID  . Chlorhexidine Gluconate Cloth  6 each Topical Daily  . insulin aspart  0-9 Units Subcutaneous Q4H  . olopatadine  1 drop Both Eyes BID  . pantoprazole (PROTONIX) IV  40 mg Intravenous Q12H  . sodium chloride flush  10-40 mL Intracatheter Q12H   Continuous Infusions: . ampicillin-sulbactam (UNASYN) IV 1.5 g (12/06/19 1241)  . chlorproMAZINE (THORAZINE) IV 12.5 mg (12/06/19 0115)  . lactated ringers 75 mL/hr at 12/06/19 0112  . levETIRAcetam 250 mg (12/06/19 1028)   PRN Meds: acetaminophen,  alum & mag hydroxide-simeth, chlorproMAZINE (THORAZINE) IV, dextrose, ondansetron (ZOFRAN) IV, Resource ThickenUp Clear  Time spent: 35 minutes  Author: Berle Mull, MD [facility] 12/06/2019 5:05 PM  To reach On-call, see care teams to locate the attending and reach out via [email or web address]. Between 7PM-7AM, please contact night-coverage If you still have difficulty reaching the attending provider, please page the [facility] (Director on Call) for [facility] on amion for assistance.

## 2019-12-07 DIAGNOSIS — E1111 Type 2 diabetes mellitus with ketoacidosis with coma: Secondary | ICD-10-CM

## 2019-12-07 LAB — CBC WITH DIFFERENTIAL/PLATELET
Abs Immature Granulocytes: 0.06 10*3/uL (ref 0.00–0.07)
Basophils Absolute: 0.1 10*3/uL (ref 0.0–0.1)
Basophils Relative: 1 %
Eosinophils Absolute: 0.1 10*3/uL (ref 0.0–0.5)
Eosinophils Relative: 1 %
HCT: 30.3 % — ABNORMAL LOW (ref 36.0–46.0)
Hemoglobin: 10.3 g/dL — ABNORMAL LOW (ref 12.0–15.0)
Immature Granulocytes: 1 %
Lymphocytes Relative: 13 %
Lymphs Abs: 1.4 10*3/uL (ref 0.7–4.0)
MCH: 32.9 pg (ref 26.0–34.0)
MCHC: 34 g/dL (ref 30.0–36.0)
MCV: 96.8 fL (ref 80.0–100.0)
Monocytes Absolute: 0.8 10*3/uL (ref 0.1–1.0)
Monocytes Relative: 8 %
Neutro Abs: 8.3 10*3/uL — ABNORMAL HIGH (ref 1.7–7.7)
Neutrophils Relative %: 76 %
Platelets: 150 10*3/uL (ref 150–400)
RBC: 3.13 MIL/uL — ABNORMAL LOW (ref 3.87–5.11)
RDW: 12.7 % (ref 11.5–15.5)
WBC: 10.6 10*3/uL — ABNORMAL HIGH (ref 4.0–10.5)
nRBC: 0 % (ref 0.0–0.2)

## 2019-12-07 LAB — COMPREHENSIVE METABOLIC PANEL
ALT: 8 U/L (ref 0–44)
AST: 14 U/L — ABNORMAL LOW (ref 15–41)
Albumin: 1.9 g/dL — ABNORMAL LOW (ref 3.5–5.0)
Alkaline Phosphatase: 77 U/L (ref 38–126)
Anion gap: 8 (ref 5–15)
BUN: 13 mg/dL (ref 8–23)
CO2: 19 mmol/L — ABNORMAL LOW (ref 22–32)
Calcium: 7.5 mg/dL — ABNORMAL LOW (ref 8.9–10.3)
Chloride: 109 mmol/L (ref 98–111)
Creatinine, Ser: 0.82 mg/dL (ref 0.44–1.00)
GFR calc Af Amer: >60 mL/min (ref >60)
GFR calc non Af Amer: >60 mL/min (ref >60)
Glucose, Bld: 214 mg/dL — ABNORMAL HIGH (ref 70–99)
Potassium: 3.6 mmol/L (ref 3.5–5.1)
Sodium: 136 mmol/L (ref 135–145)
Total Bilirubin: 1.6 mg/dL — ABNORMAL HIGH (ref 0.3–1.2)
Total Protein: 4.9 g/dL — ABNORMAL LOW (ref 6.5–8.1)

## 2019-12-07 LAB — GLUCOSE, CAPILLARY
Glucose-Capillary: 165 mg/dL — ABNORMAL HIGH (ref 70–99)
Glucose-Capillary: 185 mg/dL — ABNORMAL HIGH (ref 70–99)
Glucose-Capillary: 189 mg/dL — ABNORMAL HIGH (ref 70–99)
Glucose-Capillary: 203 mg/dL — ABNORMAL HIGH (ref 70–99)
Glucose-Capillary: 220 mg/dL — ABNORMAL HIGH (ref 70–99)

## 2019-12-07 LAB — MAGNESIUM: Magnesium: 1.6 mg/dL — ABNORMAL LOW (ref 1.7–2.4)

## 2019-12-07 MED ORDER — INSULIN GLARGINE 100 UNIT/ML ~~LOC~~ SOLN
5.0000 [IU] | Freq: Every day | SUBCUTANEOUS | Status: DC
  Administered 2019-12-07 – 2019-12-08 (×2): 5 [IU] via SUBCUTANEOUS
  Filled 2019-12-07 (×2): qty 0.05

## 2019-12-07 NOTE — Progress Notes (Signed)
PROGRESS NOTE    Katie Lozano  BZJ:[id number] DOB: [birth year] DOA: 12/04/2019 PCP: Bernerd Limbo, MD      Brief Narrative:  Katie Lozano is a 79 y.o. F with hx dementia lives in memory care, CVA, DM, GIB who presneted with confusion and coffee-ground emesis.  Found to have DKA.          Assessment & Plan:  DKA Started on IV insulin, close monitoring of electrolytes.  Gap now closed, transition to subcutaneous insulin yesterday.  Glucose is somewhat elevated today. -Continue sliding scale corrections -Start ultra low-dose Lantus -Hold semaglutide   Aspiration Pneumonia Fever again this morning -Continue Unasyn  Hematemesis No further clinical bleeding Hemoglobin stable -Continue PPI -Hold Plavix  ESBL UTI Complete ertapenem for now.  Hiccups -Continue as needed Thorazine  Hypertension Blood pressure low -Hold metoprolol  Pressure injury, stage I, sacrum, right, present on admission  AKI on CKD stage II Creatinine resolved back to baseline  Dementia -Hold Depakote for now   Prior stroke -Hold Plavix     Disposition: Status is: Inpatient  Remains inpatient appropriate because:ongoing fever   Dispo: The patient is from: ALF              Anticipated d/c is to: ALF              Anticipated d/c date is: 1 day              Patient currently is not medically stable to d/c.              MDM: The below labs and imaging reports were reviewed and summarized above.  Medication management as above.   DVT prophylaxis: SCDs Code Status: DNR Family Communication: Daughter by phone    Consultants:     Procedures:     Antimicrobials:      Culture data:              Subjective: Fever this morning.  No change in mentation.  No vomiting, respiratory distress.    Objective: Vitals:   12/07/19 0500 12/07/19 0600 12/07/19 0700 12/07/19 0800  BP:  (!) 139/50  (!) 116/53  Pulse: 89 93 86 97  Resp: (!) 33 (!) 31 (!)  27 20  Temp:    (!) 100.5 F (38.1 C)  TempSrc:    Axillary  SpO2: 91% 91% 97% 91%   No intake or output data in the 24 hours ending 12/07/19 0953 There were no vitals filed for this visit.  Examination: General appearance:  adult female, alert and in no acute distress.  Appears tired, chronically ill HEENT: Bald.  Anicteric, conjunctiva pink, lids and lashes normal. No nasal deformity, discharge, epistaxis.  OP dry, no oral lesions, hearing diminished.   Skin: Warm and dry.  Pale, no jaundice.  No suspicious rashes or lesions. Cardiac: RRR, nl S1-S2, no murmurs appreciated.  Capillary refill is brisk.  JVP not visible.  No LE edema.  Radial pulses 2+ and symmetric. Respiratory: Normal respiratory rate and rhythm.  Shallow, diminished bilaterally, no wheezing or rales appreciated.    Abdomen: Abdomen soft.  No TTP or guarding. No ascites, distension, hepatosplenomegaly.   MSK: No deformities or effusions. Neuro: Awake and alert.  EOMI, moves all extremities. Speech fluent.    Psych: Sensorium intact and responding to questions, attention normal. Affect normal.  Judgment and insight appear impaired by dementia.    Data Reviewed: I have personally reviewed following labs and imaging studies:  CBC:  Recent Labs  Lab 12/04/19 1122 12/04/19 1122 12/04/19 1227 12/04/19 1935 12/05/19 1016 12/06/19 0501 12/07/19 0335  WBC 20.3*  --   --  21.1* 16.6* 13.2* 10.6*  NEUTROABS 17.3*  --   --   --  12.9* 9.7* 8.3*  HGB 14.6   < > 13.9 11.8* 12.3 11.2* 10.3*  HCT 44.4   < > 41.0 35.3* 35.8* 32.3* 30.3*  MCV 101.4*  --   --  100.0 97.0 95.8 96.8  PLT 290  --   --  238 209 172 150   < > = values in this interval not displayed.   Basic Metabolic Panel: Recent Labs  Lab 12/05/19 0100 12/05/19 0545 12/05/19 1016 12/06/19 0501 12/07/19 0335  NA 145 143 143 140 136  K 4.3 3.4* 3.4* 3.0* 3.6  CL 118* 117* 117* 111 109  CO2 19* 18* 17* 20* 19*  GLUCOSE 137* 141* 119* 142* 214*  BUN 34*  31* 26* 15 13  CREATININE 0.81 0.76 0.70 0.69 0.82  CALCIUM 8.5* 8.5* 8.5* 8.1* 7.5*  MG  --   --   --  1.4* 1.6*   GFR: CrCl cannot be calculated (Unknown ideal weight.). Liver Function Tests: Recent Labs  Lab 12/04/19 1122 12/07/19 0335  AST 18 14*  ALT 13 8  ALKPHOS 73 77  BILITOT 1.6* 1.6*  PROT 6.3* 4.9*  ALBUMIN 3.0* 1.9*   Recent Labs  Lab 12/04/19 1122  LIPASE 17   Recent Labs  Lab 12/05/19 1700  AMMONIA 18   Coagulation Profile: Recent Labs  Lab 12/04/19 1153  INR 1.3*   Cardiac Enzymes: No results for input(s): CKTOTAL, CKMB, CKMBINDEX, TROPONINI in the last 168 hours. BNP (last 3 results) No results for input(s): PROBNP in the last 8760 hours. HbA1C: No results for input(s): HGBA1C in the last 72 hours. CBG: Recent Labs  Lab 12/06/19 1736 12/06/19 1913 12/06/19 2317 12/07/19 0235 12/07/19 0807  GLUCAP 140* 124* 115* 165* 189*   Lipid Profile: No results for input(s): CHOL, HDL, LDLCALC, TRIG, CHOLHDL, LDLDIRECT in the last 72 hours. Thyroid Function Tests: Recent Labs    12/05/19 1700  TSH 0.782  FREET4 1.30*   Anemia Panel: Recent Labs    12/05/19 1700  VITAMINB12 471   Urine analysis:    Component Value Date/Time   COLORURINE STRAW (A) 12/04/2019 1425   APPEARANCEUR CLEAR 12/04/2019 1425   LABSPEC 1.022 12/04/2019 1425   PHURINE 5.0 12/04/2019 1425   GLUCOSEU >=500 (A) 12/04/2019 1425   HGBUR NEGATIVE 12/04/2019 1425   BILIRUBINUR NEGATIVE 12/04/2019 1425   KETONESUR 80 (A) 12/04/2019 1425   PROTEINUR NEGATIVE 12/04/2019 1425   NITRITE NEGATIVE 12/04/2019 1425   LEUKOCYTESUR NEGATIVE 12/04/2019 1425   Sepsis Labs: @LABRCNTIP (procalcitonin:4,lacticacidven:4)  ) Recent Results (from the past 240 hour(s))  SARS Coronavirus 2 by RT PCR (hospital order, performed in [place] hospital lab) Nasopharyngeal Nasopharyngeal Swab     Status: None   Collection Time: 12/04/19  3:10 PM   Specimen: Nasopharyngeal Swab  Result  Value Ref Range Status   SARS Coronavirus 2 NEGATIVE NEGATIVE Final    Comment: (NOTE) SARS-CoV-2 target nucleic acids are NOT DETECTED. The SARS-CoV-2 RNA is generally detectable in upper and lower respiratory specimens during the acute phase of infection. The lowest concentration of SARS-CoV-2 viral copies this assay can detect is 250 copies / mL. A negative result does not preclude SARS-CoV-2 infection and should not be used as the sole basis for treatment or  other patient management decisions.  A negative result may occur with improper specimen collection / handling, submission of specimen other than nasopharyngeal swab, presence of viral mutation(s) within the areas targeted by this assay, and inadequate number of viral copies (<250 copies / mL). A negative result must be combined with clinical observations, patient history, and epidemiological information. Fact Sheet for Patients:   StrictlyIdeas.no Fact Sheet for Healthcare Providers: BankingDealers.co.za This test is not yet approved or cleared  by the [place] FDA and has been authorized for detection and/or diagnosis of SARS-CoV-2 by FDA under an Emergency Use Authorization (EUA).  This EUA will remain in effect (meaning this test can be used) for the duration of the COVID-19 declaration under Section 564(b)(1) of the Act, 21 U.S.C. section 360bbb-3(b)(1), unless the authorization is terminated or revoked sooner. Performed at [facility], [place] [address] [place], [place] [address]   MRSA PCR Screening     Status: None   Collection Time: 12/04/19  9:31 PM   Specimen: Nasal Mucosa; Nasopharyngeal  Result Value Ref Range Status   MRSA by PCR NEGATIVE NEGATIVE Final    Comment:        The GeneXpert MRSA Assay (FDA approved for NASAL specimens only), is one component of a comprehensive MRSA colonization surveillance program. It is not intended to diagnose  MRSA infection nor to guide or monitor treatment for MRSA infections. Performed at [facility], [place] [address], [place], [place] [address]   Culture, blood (routine x 2)     Status: None (Preliminary result)   Collection Time: 12/06/19  8:30 AM   Specimen: BLOOD RIGHT ARM  Result Value Ref Range Status   Specimen Description BLOOD RIGHT ARM  Final   Special Requests   Final    BOTTLES DRAWN AEROBIC ONLY Blood Culture results may not be optimal due to an inadequate volume of blood received in culture bottles   Culture   Final    NO GROWTH < 24 HOURS Performed at [facility], [place] [address], [place], [place] [address]    Report Status PENDING  Incomplete  Culture, blood (routine x 2)     Status: None (Preliminary result)   Collection Time: 12/06/19  8:50 AM   Specimen: BLOOD RIGHT HAND  Result Value Ref Range Status   Specimen Description BLOOD RIGHT HAND  Final   Special Requests   Final    BOTTLES DRAWN AEROBIC ONLY Blood Culture results may not be optimal due to an inadequate volume of blood received in culture bottles   Culture   Final    NO GROWTH < 24 HOURS Performed at [facility], [place] [address], [place], [place] [address]    Report Status PENDING  Incomplete         Radiology Studies: DG CHEST PORT 1 VIEW  Result Date: 12/06/2019 CLINICAL DATA:  Confusion and emesis EXAM: PORTABLE CHEST 1 VIEW COMPARISON:  December 04, 2019 FINDINGS: There is airspace opacity in portions of the right mid and lower lung regions. Lungs elsewhere are clear. Heart size and pulmonary vascularity are normal. No adenopathy. No bone lesions. IMPRESSION: Airspace opacity in portions of the right mid and lower lung zones consistent with pneumonia. Lungs elsewhere clear. Cardiac silhouette within normal limits. Electronically Signed   By: Lowella Grip III M.D.   On: 12/06/2019 11:19        Scheduled Meds: . chlorhexidine  15 mL Mouth/Throat QID  . Chlorhexidine  Gluconate Cloth  6 each Topical Daily  . insulin aspart  0-9 Units Subcutaneous Q4H  . insulin glargine  5 Units Subcutaneous Daily  . olopatadine  1 drop Both Eyes BID  . pantoprazole  40 mg Oral Daily  . saccharomyces boulardii  250 mg Oral BID  . sodium chloride flush  10-40 mL Intracatheter Q12H   Continuous Infusions: . ampicillin-sulbactam (UNASYN) IV 1.5 g (12/07/19 0440)  . chlorproMAZINE (THORAZINE) IV 12.5 mg (12/06/19 0115)  . levETIRAcetam 250 mg (12/06/19 2132)     LOS: 3 days    Time spent: 25 minutes    Edwin Dada, MD [facility] 12/07/2019, 9:53 AM     Please page though [place] or Epic secure chat:  For [employer], Adult nurse

## 2019-12-07 NOTE — Evaluation (Signed)
Physical Therapy Evaluation Patient Details Name: Katie Lozano MRN: [id number] DOB: 13-Jul-1940 Today's Date: 12/07/2019   History of Present Illness  Pt is a 79 y/o female with PMH of CVA, IDDM, HLD, anemia, GI bleed, GERD, and mild dementia presents from SNF with AMS, DKA, coffee ground emesis.  Clinical Impression   Pt presents with generalized weakness, impaired cognition more significant than baseline per chart review (no family present on eval), impaired sitting and standing balance, difficulty performing mobility tasks, and decreased activity tolerance. Pt to benefit from acute PT to address deficits. Pt required mod-max assist for bed mobility and transfer to and from recliner, pt with x3 stands total limited by stool incontinence each stand requiring total assist for pericare. PT recommending SNF level of care post-acutely to address mobility deficits and provide necessary assist for ADLs. PT to progress mobility as tolerated, and will continue to follow acutely.      Follow Up Recommendations SNF;Supervision/Assistance - 24 hour    Equipment Recommendations  None recommended by PT    Recommendations for Other Services       Precautions / Restrictions Precautions Precautions: Fall Restrictions Weight Bearing Restrictions: No      Mobility  Bed Mobility Overal bed mobility: Needs Assistance Bed Mobility: Supine to Sit;Rolling;Sit to Supine Rolling: Mod assist   Supine to sit: Mod assist;HOB elevated Sit to supine: Mod assist;HOB elevated   General bed mobility comments: Mod assist for rolling bilaterally for truncal translation during pericare. Mod assist for supine<>sit for trunk and LE management, scooting to and from EOB, and scooting up in bed upon return to supine with use of bed pads.  Transfers Overall transfer level: Needs assistance Equipment used: 1 person hand held assist Transfers: Sit to/from [facility] Sit to Stand: Mod assist;From  elevated surface Stand pivot transfers: Max assist;From elevated surface       General transfer comment: Mod assist for power up, steadying, pt with use of UEs to push off bed. Stand pivot to and from recliner with max assist to steady, support bodyweight, and guide pt hips to destination surface. Pt resistant to mobility mid-transfer, requires increased assist due to guarding. Limited by stool incontinence during transfer.  Ambulation/Gait             General Gait Details: unable this day  Stairs            Wheelchair Mobility    Modified Rankin (Stroke Patients Only)       Balance Overall balance assessment: Needs assistance Sitting-balance support: Bilateral upper extremity supported;Feet supported Sitting balance-Leahy Scale: Fair     Standing balance support: Bilateral upper extremity supported;During functional activity Standing balance-Leahy Scale: Poor Standing balance comment: max assist for transfers                             Pertinent Vitals/Pain Pain Assessment: Faces Faces Pain Scale: No hurt Pain Intervention(s): Monitored during session;Limited activity within patient's tolerance    Home Living Family/patient expects to be discharged to:: Skilled nursing facility                      Prior Function Level of Independence: Needs assistance   Gait / Transfers Assistance Needed: Pt states "yes" when asked if she walks. Per chart review from note 1 month ago, pt was ambulating with RW  ADL's / Homemaking Assistance Needed: PT asks if pt needs help dressing and  bathing self, pt states "if I need them to". pt is a poor historian        Hand Dominance   Dominant Hand: Right    Extremity/Trunk Assessment   Upper Extremity Assessment Upper Extremity Assessment: Generalized weakness    Lower Extremity Assessment Lower Extremity Assessment: Generalized weakness    Cervical / Trunk Assessment Cervical / Trunk  Assessment: Normal  Communication   Communication: No difficulties  Cognition Arousal/Alertness: Awake/alert Behavior During Therapy: Restless;Anxious Overall Cognitive Status: History of cognitive impairments - at baseline Area of Impairment: Orientation;Memory;Attention;Following commands;Safety/judgement;Problem solving                 Orientation Level: Disoriented to;Place;Time;Situation Current Attention Level: Sustained Memory: Decreased short-term memory Following Commands: Follows one step commands inconsistently;Follows one step commands with increased time Safety/Judgement: Decreased awareness of safety;Decreased awareness of deficits   Problem Solving: Slow processing;Requires verbal cues;Requires tactile cues;Decreased initiation;Difficulty sequencing General Comments: History of dementia. Pt oriented to self only, is oriented to year when given options. Pt states "we are in a jumbled up place" when asked where we were, pt states we are not in the hospital after PT oriented her. Pt states her daddy is coming to pick her up. Pt requires re-phrasing and repeated multimodal cuing to participate in mobility.      General Comments      Exercises     Assessment/Plan    PT Assessment Patient needs continued PT services  PT Problem List Decreased strength;Decreased mobility;Decreased safety awareness;Decreased activity tolerance;Decreased cognition;Decreased balance;Decreased knowledge of use of DME       PT Treatment Interventions DME instruction;Therapeutic activities;Gait training;Patient/family education;Therapeutic exercise;Balance training;Functional mobility training;Neuromuscular re-education    PT Goals (Current goals can be found in the Care Plan section)  Acute Rehab PT Goals Patient Stated Goal: none stated PT Goal Formulation: Patient unable to participate in goal setting Time For Goal Achievement: 12/21/19 Potential to Achieve Goals: Fair     Frequency Min 2X/week   Barriers to discharge        Co-evaluation               AM-PAC PT "6 Clicks" Mobility  Outcome Measure Help needed turning from your back to your side while in a flat bed without using bedrails?: A Lot Help needed moving from lying on your back to sitting on the side of a flat bed without using bedrails?: A Lot Help needed moving to and from a bed to a chair (including a wheelchair)?: Total Help needed standing up from a chair using your arms (e.g., wheelchair or bedside chair)?: A Lot Help needed to walk in hospital room?: Total Help needed climbing 3-5 steps with a railing? : Total 6 Click Score: 9    End of Session Equipment Utilized During Treatment: Gait belt Activity Tolerance: Patient tolerated treatment well;Patient limited by fatigue Patient left: in bed;with call bell/phone within reach;with bed alarm set Nurse Communication: Mobility status PT Visit Diagnosis: Other abnormalities of gait and mobility (R26.89);Muscle weakness (generalized) (M62.81)    Time: 1275-1700 PT Time Calculation (min) (ACUTE ONLY): 45 min   Charges:   PT Evaluation $PT Eval Low Complexity: 1 Low PT Treatments $Therapeutic Activity: 23-37 mins      Elridge Stemm E, PT Acute Rehabilitation Services Pager [phone]  Office [phone]   Kohl Polinsky D Elonda Husky 12/07/2019, 4:43 PM

## 2019-12-07 NOTE — Plan of Care (Signed)
  Problem: Clinical Measurements: Goal: Diagnostic test results will improve Outcome: Progressing Goal: Respiratory complications will improve Outcome: Progressing Goal: Cardiovascular complication will be avoided Outcome: Progressing   Problem: Activity: Goal: Risk for activity intolerance will decrease Outcome: Progressing   Problem: Nutrition: Goal: Adequate nutrition will be maintained Outcome: Progressing   

## 2019-12-07 NOTE — Care Management Important Message (Signed)
Important Message  Patient Details  Name: Katie Lozano MRN: [id number] Date of Birth: 24-Jul-1940   Medicare Important Message Given:     Patient could not sign due to illness.  Signed copy left at the bedside.   [name] 12/07/2019, 1:33 PM

## 2019-12-07 NOTE — Progress Notes (Addendum)
  Speech Language Pathology Treatment: Dysphagia  Patient Details Name: Katie Lozano MRN: [id number] DOB: 17-Jun-1941 Today's Date: 12/07/2019 Time: 9977-4142 SLP Time Calculation (min) (ACUTE ONLY): 20 min  Assessment / Plan / Recommendation Clinical Impression  Pt was awake, verbally communicating and following simple commands today with baseline dementia. Assisted in self feeding water via straw and saltine cracker. Additional time required to [facility] larger bite and independently removing excess. Trials thin consumed without s/s aspiration- needed mild recovery time for respiration after multiple sips. She has been intermittently febrile since admission and currently has pneumonia. Cannot rule out silent aspiration with subjective measures. She may require MBS however for now will upgrade liquids to thin and texture to Dys 2. ST will follow until discharge.    HPI HPI: Katie Lozano is a 79 y.o. female with medical history significant of CVA, insulin-dependent diabetes mellitus,GERDR, hiatal hernia, hyperlipidemia, anemia, GI bleed, mild dementia who presented from [place] presented with increasing confusion. of generalized weakness and coffee ground vomiting. Per note, recent dx of  UTI. Found to be in DKA. Per notes re: coffee ground emesis hemoglobin stable, no vomiting and continue PPI. CXR 6/8 Airspace opacity in portions of the right mid and lower lung zones consistent wit pneumonia. MD concerned with dysphagia and ordered nectar thick liquids and puree. No prior ST notes for swallow found.       SLP Plan  Continue with current plan of care       Recommendations  Diet recommendations: Dysphagia 2 (fine chop);Thin liquid Liquids provided via: Straw;Cup Medication Administration: Crushed with puree Supervision: Staff to assist with self feeding;Full supervision/cueing for compensatory strategies Compensations: Minimize environmental distractions;Slow rate;Small  sips/bites;Lingual sweep for clearance of pocketing Postural Changes and/or Swallow Maneuvers: Seated upright 90 degrees                Oral Care Recommendations: Oral care BID Follow up Recommendations: Skilled Nursing facility SLP Visit Diagnosis: Dysphagia, unspecified (R13.10) Plan: Continue with current plan of care       [place]ton, Wiley Flicker Willis 12/07/2019, 12:04 PM  Orbie Pyo Colvin Caroli.Ed Risk analyst [phone] Office [phone]

## 2019-12-07 NOTE — Progress Notes (Signed)
RT instructed the patient on the use of flutter valve. Patient was able to demonstrate back, but is weak.

## 2019-12-07 NOTE — TOC Progression Note (Signed)
Transition of Care [facility]) - Progression Note    Patient Details  Name: Katie Lozano MRN: [id number] Date of Birth: December 04, 1940  Transition of Care [facility]) CM/SW [place], [place] Phone Number: 12/07/2019, 2:03 PM  Clinical Narrative:     CSW spoke with patients sister Katie Lozano. She confirmed that patient is from [facility], [place] reached out to [facility] they also confirmed that patient is long term resident there. Plan is for patient to go back to [facility] when medically ready for discharge.  CSW will continue to follow.       Expected Discharge Plan and Services                                                 Social Determinants of Health (SDOH) Interventions    Readmission Risk Interventions No flowsheet data found.

## 2019-12-07 NOTE — Plan of Care (Signed)
APPROPRIATE FOR DISCHARGE

## 2019-12-08 ENCOUNTER — Inpatient Hospital Stay (HOSPITAL_COMMUNITY): Payer: Medicare (Managed Care)

## 2019-12-08 LAB — COMPREHENSIVE METABOLIC PANEL
ALT: 12 U/L (ref 0–44)
AST: 18 U/L (ref 15–41)
Albumin: 2.1 g/dL — ABNORMAL LOW (ref 3.5–5.0)
Alkaline Phosphatase: 90 U/L (ref 38–126)
Anion gap: 13 (ref 5–15)
BUN: 14 mg/dL (ref 8–23)
CO2: 18 mmol/L — ABNORMAL LOW (ref 22–32)
Calcium: 7.9 mg/dL — ABNORMAL LOW (ref 8.9–10.3)
Chloride: 109 mmol/L (ref 98–111)
Creatinine, Ser: 0.84 mg/dL (ref 0.44–1.00)
GFR calc Af Amer: >60 mL/min (ref >60)
GFR calc non Af Amer: >60 mL/min (ref >60)
Glucose, Bld: 181 mg/dL — ABNORMAL HIGH (ref 70–99)
Potassium: 3.3 mmol/L — ABNORMAL LOW (ref 3.5–5.1)
Sodium: 140 mmol/L (ref 135–145)
Total Bilirubin: 1.4 mg/dL — ABNORMAL HIGH (ref 0.3–1.2)
Total Protein: 5.4 g/dL — ABNORMAL LOW (ref 6.5–8.1)

## 2019-12-08 LAB — CBC
HCT: 33.2 % — ABNORMAL LOW (ref 36.0–46.0)
Hemoglobin: 11.5 g/dL — ABNORMAL LOW (ref 12.0–15.0)
MCH: 33.4 pg (ref 26.0–34.0)
MCHC: 34.6 g/dL (ref 30.0–36.0)
MCV: 96.5 fL (ref 80.0–100.0)
Platelets: 183 10*3/uL (ref 150–400)
RBC: 3.44 MIL/uL — ABNORMAL LOW (ref 3.87–5.11)
RDW: 12.7 % (ref 11.5–15.5)
WBC: 8.8 10*3/uL (ref 4.0–10.5)
nRBC: 0 % (ref 0.0–0.2)

## 2019-12-08 LAB — GLUCOSE, CAPILLARY
Glucose-Capillary: 160 mg/dL — ABNORMAL HIGH (ref 70–99)
Glucose-Capillary: 190 mg/dL — ABNORMAL HIGH (ref 70–99)
Glucose-Capillary: 236 mg/dL — ABNORMAL HIGH (ref 70–99)
Glucose-Capillary: 203 mg/dL — ABNORMAL HIGH (ref 70–99)
Glucose-Capillary: 130 mg/dL — ABNORMAL HIGH (ref 70–99)

## 2019-12-08 LAB — VITAMIN B1: Vitamin B1 (Thiamine): 127.9 nmol/L (ref 66.5–200.0)

## 2019-12-08 LAB — SARS CORONAVIRUS 2 (TAT 6-24 HRS): SARS Coronavirus 2: NEGATIVE

## 2019-12-08 MED ORDER — MAGNESIUM SULFATE 2 GM/50ML IV SOLN
2.0000 g | Freq: Once | INTRAVENOUS | Status: AC
  Administered 2019-12-08: 2 g via INTRAVENOUS
  Filled 2019-12-08: qty 50

## 2019-12-08 MED ORDER — LEVETIRACETAM 250 MG PO TABS
250.0000 mg | ORAL_TABLET | Freq: Two times a day (BID) | ORAL | Status: DC
  Administered 2019-12-08 – 2019-12-09 (×2): 250 mg via ORAL
  Filled 2019-12-08 (×3): qty 1

## 2019-12-08 MED ORDER — INSULIN GLARGINE 100 UNIT/ML ~~LOC~~ SOLN
15.0000 [IU] | Freq: Every day | SUBCUTANEOUS | Status: DC
  Administered 2019-12-09: 15 [IU] via SUBCUTANEOUS
  Filled 2019-12-08: qty 0.15

## 2019-12-08 MED ORDER — POTASSIUM CHLORIDE CRYS ER 20 MEQ PO TBCR
40.0000 meq | EXTENDED_RELEASE_TABLET | Freq: Once | ORAL | Status: AC
  Administered 2019-12-08: 40 meq via ORAL
  Filled 2019-12-08: qty 2

## 2019-12-08 NOTE — NC FL2 (Signed)
[place] MEDICAID FL2 LEVEL OF CARE SCREENING TOOL     IDENTIFICATION  Patient Name: Katie Lozano Birthdate: February 13, 1941 Sex: female Admission Date (Current Location): 12/04/2019  [facility] and [place] Number:  Herbalist and Address:  [facility] [place], [place] [address], [place], [place] [address]      Provider Number: [id number]  Attending Physician Name and Address:  Edwin Dada, *  Relative Name and Phone Number:  Andreina, [place] Daughter [phone]    Current Level of Care: Hospital Recommended Level of Care: [place] Prior Approval Number: [id number]  Date Approved/Denied:   PASRR Number:    Discharge Plan: SNF    Current Diagnoses: Patient Active Problem List   Diagnosis Date Noted  . Near syncope 10/16/2019  . Generalized weakness 10/16/2019  . History of stroke 10/16/2019  . Dehydration 10/16/2019  . History of Clostridioides difficile colitis 10/16/2019  . History of UTI 10/16/2019  . Seizure ([place]) 05/15/2018  . Cerebral embolism with cerebral infarction 09/16/2016  . Protein-calorie malnutrition, severe 09/15/2016  . Acute encephalopathy 09/14/2016  . 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 Diabetes mellitus ([place]) 09/02/2016  . Anemia 09/02/2016  . 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 NSTEMI (non-ST elevated myocardial infarction) ([place]) 07/22/2016  . Pressure injury of skin 07/17/2016  . UGIB (upper gastrointestinal bleed)   . DKA (diabetic ketoacidoses) ([place]) 07/11/2016  . Essential hypertension 06/06/2016  . CKD (chronic kidney disease), stage III 06/06/2016  . Carotid artery syndrome 06/05/2016  . Type 2 diabetes mellitus with diabetic nephropathy, with long-term current use of insulin ([place]) 03/30/2015  . History of colonic polyps 03/30/2015  . Hyperlipidemia 03/30/2015    Orientation RESPIRATION BLADDER Height & Weight     Self  O2 ([place] 2L) External catheter Weight:   Height:      BEHAVIORAL SYMPTOMS/MOOD NEUROLOGICAL BOWEL NUTRITION STATUS   (none)  (none) Incontinent Diet (See discharge summary)  AMBULATORY STATUS COMMUNICATION OF NEEDS Skin   Extensive Assist Verbally PU Stage and Appropriate Care (Intact skin with non-blanchable redness of a localized area usually over a bony prominence.)                       Personal Care Assistance Level of Assistance  Bathing, Feeding, Dressing Bathing Assistance: Limited assistance Feeding assistance: Independent Dressing Assistance: Limited assistance     Functional Limitations Info  Sight, Hearing, Speech Sight Info: Adequate Hearing Info: Adequate Speech Info: Adequate    SPECIAL CARE FACTORS FREQUENCY  PT (By licensed PT), OT (By licensed OT)     PT Frequency: 5x/week OT Frequency: 5x/week            Contractures Contractures Info: Not present    Additional Factors Info  Code Status, Allergies Code Status Info: DNR Allergies Info: Trulicity (dulaglutide)           Current Medications (12/08/2019):  This is the current hospital active medication list Current Facility-Administered Medications  Medication Dose Route Frequency Provider Last Rate Last Admin  . acetaminophen (TYLENOL) tablet 650 mg  650 mg Oral Q6H PRN Lavina Hamman, MD   650 mg at 12/06/19 0850  . alum & mag hydroxide-simeth (MAALOX/MYLANTA) 200-200-20 MG/5ML suspension 30 mL  30 mL Oral Q4H PRN Wynetta Fines T, MD      . ampicillin-sulbactam (UNASYN) 1.5 g in sodium chloride 0.9 % 100 mL IVPB  1.5 g Intravenous Q6H Lavina Hamman, MD 200 mL/hr at 12/08/19 [phone]  1.5 g at 12/08/19 0517  . chlorhexidine (PERIDEX) 0.12 % solution 15 mL  15 mL Mouth/Throat QID Lavina Hamman, MD   15 mL at 12/07/19 1002  . Chlorhexidine Gluconate Cloth 2 % PADS 6 each  6 each Topical Daily Dory Horn, MD   6 each at 12/08/19 0928  . chlorproMAZINE (THORAZINE) 12.5 mg in sodium chloride 0.9 % 25 mL IVPB  12.5 mg Intravenous Q8H PRN Lavina Hamman, MD 50 mL/hr at 12/06/19 0115 12.5 mg at 12/06/19 0115  . dextrose 50 % solution 0-50 mL  0-50 mL Intravenous PRN Venter, Margaux, PA-C      . insulin aspart (novoLOG) injection 0-9 Units  0-9 Units Subcutaneous Q4H Lavina Hamman, MD   2 Units at 12/08/19 0750  . insulin glargine (LANTUS) injection 5 Units  5 Units Subcutaneous Daily Danford, Suann Larry, MD   5 Units at 12/07/19 1053  . levETIRAcetam (KEPPRA) 250 mg in sodium chloride 0.9 % 100 mL IVPB  250 mg Intravenous Q12H Lavina Hamman, MD 410 mL/hr at 12/07/19 2021 250 mg at 12/07/19 2021  . olopatadine (PATANOL) 0.1 % ophthalmic solution 1 drop  1 drop Both Eyes BID Wynetta Fines T, MD   1 drop at 12/07/19 2015  . ondansetron (ZOFRAN) injection 4 mg  4 mg Intravenous Q6H PRN Lavina Hamman, MD      . pantoprazole (PROTONIX) EC tablet 40 mg  40 mg Oral Daily Lavina Hamman, MD   40 mg at 12/07/19 0951  . potassium chloride SA (KLOR-CON) CR tablet 40 mEq  40 mEq Oral Once Danford, Suann Larry, MD      . Resource ThickenUp Clear   Oral PRN Lavina Hamman, MD      . saccharomyces boulardii (FLORASTOR) capsule 250 mg  250 mg Oral BID Lavina Hamman, MD   250 mg at 12/07/19 2015  . sodium chloride flush (NS) 0.9 % injection 10-40 mL  10-40 mL Intracatheter Q12H Lequita Halt, MD   10 mL at 12/07/19 2016     Discharge Medications: Please see discharge summary for a list of discharge medications.  Relevant Imaging Results:  Relevant Lab Results:   Additional Information [phone]  Bethann Berkshire, LCSW

## 2019-12-08 NOTE — Progress Notes (Signed)
PROGRESS NOTE    GEM CONKLE  UKG:[id number] DOB: 10-19-1940 DOA: 12/04/2019 PCP: Bernerd Limbo, MD      Brief Narrative:  Mrs. Ellwanger is a 79 y.o. F with hx dementia lives in memory care, CVA, DM, GIB who presneted with confusion and coffee-ground emesis.  Found to have DKA.          Assessment & Plan:  DKA Admitted and started on IV insulin, close monitoring of electrolytes.  Gap closed on IV insulin and the patient was transitioned to subcutaneous insulin.  Since then her blood glucoses have been stable (had had some hypoglycemia earlier in hospital stay). -Continue Lantus, increased dose -Continue/scale correction  -Hold semaglutide   Aspiration Pneumonia No fever -Continue Unasyn, day 3 of 7  Acute metabolic encephalopathy The patient remains sleepy and inattentive, with poor oral intake, different from her baseline which she is able to ambulate brief distances, feed herself, and interact with family and staff.  Hematemesis No further clinical bleeding. 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  Never had repeat EGD after that, but oral intake had returned to normal.  This would predispose to stricture, which is a consideration at this time, although my discussion with family today they would prefer not to perform excessive invasive procedures if there were not a high chance this would improve patient's quality of life. -Continue PPI -Hold Plavix  ESBL UTI Complete ertapenem for now.  Hiccups -Continue as needed Thorazine  Hypertension Blood pressure normal off meds -Hold metoprolol  Pressure injury, stage I, sacrum, right, present on admission  AKI on CKD stage II Creatinine resolved back to baseline  Dementia -Hold Depakote for now due to encephalopathy   Prior stroke -Hold Plavix     Disposition: Status is: Inpatient  Remains inpatient appropriate because: The  patient has ongoing acute metabolic encephalopathy worse than her baseline, although the overall trend of this appears to be improving   Dispo: The patient is from: ALF              Anticipated d/c is to: ALF              Anticipated d/c date is: 1 day              Patient currently is not medically stable to d/c.              MDM: The below labs and imaging reports reviewed and summarized above.  Medication management as above.    DVT prophylaxis: SCDs Code Status: DNR Family Communication: Daughter and sister at the bedside    Consultants:     Procedures:     Antimicrobials:      Culture data:              Subjective: Night, no vomiting, respiratory distress, she is slightly more alert, but still very somnolent, weak, and unable to get out of bed.  Objective: Vitals:   12/07/19 0730 12/07/19 0800 12/07/19 2304 12/08/19 1558  BP:  (!) 116/53 (!) 120/58 131/60  Pulse:  97 82 87  Resp: 18 20 17 18   Temp:  (!) 100.5 F (38.1 C) 99 F (37.2 C) 99.3 F (37.4 C)  TempSrc:  Axillary Axillary   SpO2:  91% 90% 91%    Intake/Output Summary (Last 24 hours) at 12/08/2019 1722 Last data filed at 12/08/2019 0518 Gross per 24 hour  Intake --  Output 450  ml  Net -450 ml   There were no vitals filed for this visit.  Examination: General appearance:  adult female, alert and in no acute distress.  Appears tired, chronically ill HEENT: Bald.  Anicteric, conjunctiva pink, lids and lashes normal. No nasal deformity, discharge, epistaxis.  OP dry, no oral lesions, hearing diminished.   Skin: Warm and dry.  Pale, no jaundice.  No suspicious rashes or lesions. Cardiac: RRR, nl S1-S2, no murmurs appreciated.  Capillary refill is brisk.  JVP not visible.  No LE edema.  Radial pulses 2+ and symmetric. Respiratory: Normal respiratory rate and rhythm.  Shallow, diminished bilaterally, no wheezing or rales appreciated.    Abdomen: Abdomen soft.  No TTP or  guarding. No ascites, distension, hepatosplenomegaly.   MSK: No deformities or effusions. Neuro: Awake and alert.  EOMI, moves all extremities. Speech fluent.    Psych: Sensorium intact and responding to questions, attention normal. Affect normal.  Judgment and insight appear impaired by dementia.    Data Reviewed: I have personally reviewed following labs and imaging studies:  CBC: Recent Labs  Lab 12/04/19 1122 12/04/19 1227 12/04/19 1935 12/05/19 1016 12/06/19 0501 12/07/19 0335 12/08/19 0355  WBC 20.3*   < > 21.1* 16.6* 13.2* 10.6* 8.8  NEUTROABS 17.3*  --   --  12.9* 9.7* 8.3*  --   HGB 14.6   < > 11.8* 12.3 11.2* 10.3* 11.5*  HCT 44.4   < > 35.3* 35.8* 32.3* 30.3* 33.2*  MCV 101.4*   < > 100.0 97.0 95.8 96.8 96.5  PLT 290   < > 238 209 172 150 183   < > = values in this interval not displayed.   Basic Metabolic Panel: Recent Labs  Lab 12/05/19 0545 12/05/19 1016 12/06/19 0501 12/07/19 0335 12/08/19 0355  NA 143 143 140 136 140  K 3.4* 3.4* 3.0* 3.6 3.3*  CL 117* 117* 111 109 109  CO2 18* 17* 20* 19* 18*  GLUCOSE 141* 119* 142* 214* 181*  BUN 31* 26* 15 13 14   CREATININE 0.76 0.70 0.69 0.82 0.84  CALCIUM 8.5* 8.5* 8.1* 7.5* 7.9*  MG  --   --  1.4* 1.6*  --    GFR: CrCl cannot be calculated (Unknown ideal weight.). Liver Function Tests: Recent Labs  Lab 12/04/19 1122 12/07/19 0335 12/08/19 0355  AST 18 14* 18  ALT 13 8 12   ALKPHOS 73 77 90  BILITOT 1.6* 1.6* 1.4*  PROT 6.3* 4.9* 5.4*  ALBUMIN 3.0* 1.9* 2.1*   Recent Labs  Lab 12/04/19 1122  LIPASE 17   Recent Labs  Lab 12/05/19 1700  AMMONIA 18   Coagulation Profile: Recent Labs  Lab 12/04/19 1153  INR 1.3*   Cardiac Enzymes: No results for input(s): CKTOTAL, CKMB, CKMBINDEX, TROPONINI in the last 168 hours. BNP (last 3 results) No results for input(s): PROBNP in the last 8760 hours. HbA1C: No results for input(s): HGBA1C in the last 72 hours. CBG: Recent Labs  Lab 12/07/19 1748  12/07/19 2332 12/08/19 0320 12/08/19 0736 12/08/19 1600  GLUCAP 203* 185* 160* 190* 236*   Lipid Profile: No results for input(s): CHOL, HDL, LDLCALC, TRIG, CHOLHDL, LDLDIRECT in the last 72 hours. Thyroid Function Tests: No results for input(s): TSH, T4TOTAL, FREET4, T3FREE, THYROIDAB in the last 72 hours. Anemia Panel: No results for input(s): VITAMINB12, FOLATE, FERRITIN, TIBC, IRON, RETICCTPCT in the last 72 hours. Urine analysis:    Component Value Date/Time   COLORURINE STRAW (A) 12/04/2019 1425  APPEARANCEUR CLEAR 12/04/2019 1425   LABSPEC 1.022 12/04/2019 1425   PHURINE 5.0 12/04/2019 1425   GLUCOSEU >=500 (A) 12/04/2019 1425   HGBUR NEGATIVE 12/04/2019 1425   BILIRUBINUR NEGATIVE 12/04/2019 1425   KETONESUR 80 (A) 12/04/2019 1425   PROTEINUR NEGATIVE 12/04/2019 1425   NITRITE NEGATIVE 12/04/2019 1425   LEUKOCYTESUR NEGATIVE 12/04/2019 1425   Sepsis Labs: @LABRCNTIP (procalcitonin:4,lacticacidven:4)  ) Recent Results (from the past 240 hour(s))  SARS Coronavirus 2 by RT PCR (hospital order, performed in [facility] hospital lab) Nasopharyngeal Nasopharyngeal Swab     Status: None   Collection Time: 12/04/19  3:10 PM   Specimen: Nasopharyngeal Swab  Result Value Ref Range Status   SARS Coronavirus 2 NEGATIVE NEGATIVE Final    Comment: (NOTE) SARS-CoV-2 target nucleic acids are NOT DETECTED. The SARS-CoV-2 RNA is generally detectable in upper and lower respiratory specimens during the acute phase of infection. The lowest concentration of SARS-CoV-2 viral copies this assay can detect is 250 copies / mL. A negative result does not preclude SARS-CoV-2 infection and should not be used as the sole basis for treatment or other patient management decisions.  A negative result may occur with improper specimen collection / handling, submission of specimen other than nasopharyngeal swab, presence of viral mutation(s) within the areas targeted by this assay, and inadequate  number of viral copies (<250 copies / mL). A negative result must be combined with clinical observations, patient history, and epidemiological information. Fact Sheet for Patients:   StrictlyIdeas.no Fact Sheet for Healthcare Providers: BankingDealers.co.za This test is not yet approved or cleared  by the [place] FDA and has been authorized for detection and/or diagnosis of SARS-CoV-2 by FDA under an Emergency Use Authorization (EUA).  This EUA will remain in effect (meaning this test can be used) for the duration of the COVID-19 declaration under Section 564(b)(1) of the Act, 21 U.S.C. section 360bbb-3(b)(1), unless the authorization is terminated or revoked sooner. Performed at [facility], [place] [address], [place], [place] [address]   MRSA PCR Screening     Status: None   Collection Time: 12/04/19  9:31 PM   Specimen: Nasal Mucosa; Nasopharyngeal  Result Value Ref Range Status   MRSA by PCR NEGATIVE NEGATIVE Final    Comment:        The GeneXpert MRSA Assay (FDA approved for NASAL specimens only), is one component of a comprehensive MRSA colonization surveillance program. It is not intended to diagnose MRSA infection nor to guide or monitor treatment for MRSA infections. Performed at [facility], [place] [address] [place], [place] [address]   Culture, blood (routine x 2)     Status: None (Preliminary result)   Collection Time: 12/06/19  8:30 AM   Specimen: BLOOD RIGHT ARM  Result Value Ref Range Status   Specimen Description BLOOD RIGHT ARM  Final   Special Requests   Final    BOTTLES DRAWN AEROBIC ONLY Blood Culture results may not be optimal due to an inadequate volume of blood received in culture bottles   Culture   Final    NO GROWTH 2 DAYS Performed at [facility], [place] [address] [place], [place] [address]    Report Status PENDING  Incomplete  Culture, blood (routine x 2)     Status: None  (Preliminary result)   Collection Time: 12/06/19  8:50 AM   Specimen: BLOOD RIGHT HAND  Result Value Ref Range Status   Specimen Description BLOOD RIGHT HAND  Final  Special Requests   Final    BOTTLES DRAWN AEROBIC ONLY Blood Culture results may not be optimal due to an inadequate volume of blood received in culture bottles   Culture   Final    NO GROWTH 2 DAYS Performed at [facility], [place] [address] [place], [place] [address]    Report Status PENDING  Incomplete  SARS CORONAVIRUS 2 (TAT 6-24 HRS) Nasopharyngeal Nasopharyngeal Swab     Status: None   Collection Time: 12/07/19  5:06 PM   Specimen: Nasopharyngeal Swab  Result Value Ref Range Status   SARS Coronavirus 2 NEGATIVE NEGATIVE Final    Comment: (NOTE) SARS-CoV-2 target nucleic acids are NOT DETECTED. The SARS-CoV-2 RNA is generally detectable in upper and lower respiratory specimens during the acute phase of infection. Negative results do not preclude SARS-CoV-2 infection, do not rule out co-infections with other pathogens, and should not be used as the sole basis for treatment or other patient management decisions. Negative results must be combined with clinical observations, patient history, and epidemiological information. The expected result is Negative. Fact Sheet for Patients: SugarRoll.be Fact Sheet for Healthcare Providers: https://www.woods-mathews.com/ This test is not yet approved or cleared by the [place] FDA and  has been authorized for detection and/or diagnosis of SARS-CoV-2 by FDA under an Emergency Use Authorization (EUA). This EUA will remain  in effect (meaning this test can be used) for the duration of the COVID-19 declaration under Section 56 4(b)(1) of the Act, 21 U.S.C. section 360bbb-3(b)(1), unless the authorization is terminated or revoked sooner. Performed at [facility], [place] [address], [place], [place] [address]           Radiology Studies: No results found.      Scheduled Meds: . chlorhexidine  15 mL Mouth/Throat QID  . Chlorhexidine Gluconate Cloth  6 each Topical Daily  . insulin aspart  0-9 Units Subcutaneous Q4H  . insulin glargine  5 Units Subcutaneous Daily  . levETIRAcetam  250 mg Oral BID  . olopatadine  1 drop Both Eyes BID  . pantoprazole  40 mg Oral Daily  . saccharomyces boulardii  250 mg Oral BID  . sodium chloride flush  10-40 mL Intracatheter Q12H   Continuous Infusions: . ampicillin-sulbactam (UNASYN) IV 1.5 g (12/08/19 1709)  . chlorproMAZINE (THORAZINE) IV 12.5 mg (12/06/19 0115)     LOS: 4 days    Time spent: 25 minutes    Edwin Dada, MD [facility] 12/08/2019, 5:22 PM     Please page though [place] or Epic secure chat:  For [employer], Adult nurse

## 2019-12-08 NOTE — TOC Progression Note (Signed)
Transition of Care [facility]) - Progression Note    Patient Details  Name: Katie Lozano MRN: [id number] Date of Birth: 01/06/1941  Transition of Care [facility]) CM/SW [place], [place] Phone Number: 12/08/2019, 4:46 PM  Clinical Narrative:     CSW contacted [facility] and confirmed that pt can return 12/09/19 with outpatient palliative. TOC to continue to follow.        Expected Discharge Plan and [facility]  With outpatient palliative                                             Social Determinants of Health (SDOH) Interventions    Readmission Risk Interventions No flowsheet data found.

## 2019-12-08 NOTE — Progress Notes (Signed)
  Speech Language Pathology Treatment: Dysphagia  Patient Details Name: Katie Lozano MRN: [id number] DOB: Nov 27, 1940 Today's Date: 12/08/2019 Time: 2725-3664 SLP Time Calculation (min) (ACUTE ONLY): 11 min  Assessment / Plan / Recommendation Clinical Impression  Pt would benefit from objective assessment of swallow following session this morning. Assist needed to lessen volume. Questionable airway intrusion (silent) versus dysnpeic after apneic period with swallow indicated by increased work of breathing after water. Agreeable to MBS at 12:00 today.    HPI HPI: Katie Lozano is a 79 y.o. female with medical history significant of CVA, insulin-dependent diabetes mellitus,GERDR, hiatal hernia, hyperlipidemia, anemia, GI bleed, mild dementia who presented from [place] presented with increasing confusion. of generalized weakness and coffee ground vomiting. Per note, recent dx of  UTI. Found to be in DKA. Per notes re: coffee ground emesis hemoglobin stable, no vomiting and continue PPI. CXR 6/8 Airspace opacity in portions of the right mid and lower lung zones consistent wit pneumonia. MD concerned with dysphagia and ordered nectar thick liquids and puree. No prior ST notes for swallow found.       SLP Plan  MBS       Recommendations  Diet recommendations: Thin liquid;Dysphagia 2 (fine chop) Liquids provided via: Straw;Cup Medication Administration: Crushed with puree Supervision: Staff to assist with self feeding;Full supervision/cueing for compensatory strategies Compensations: Minimize environmental distractions;Slow rate;Small sips/bites;Lingual sweep for clearance of pocketing Postural Changes and/or Swallow Maneuvers: Seated upright 90 degrees                Oral Care Recommendations: Oral care BID Follow up Recommendations: Skilled Nursing facility SLP Visit Diagnosis: Dysphagia, unspecified (R13.10) Plan: MBS       GO                Houston Siren 12/08/2019, 8:48 AM  Orbie Pyo [place].Ed Risk analyst [phone] Office [phone]

## 2019-12-08 NOTE — Evaluation (Signed)
Clinical/Bedside Swallow Evaluation Patient Details  Name: Katie Lozano MRN: [id number] Date of Birth: [birth year]  Today's Date: 12/08/2019 Time: SLP Start Time (ACUTE ONLY): 1214 SLP Stop Time (ACUTE ONLY): 1232 SLP Time Calculation (min) (ACUTE ONLY): 18 min  Past Medical History:  Past Medical History:  Diagnosis Date  . Allergy   . Anemia associated with acute blood loss 06/2016  . Colon polyp   . Diabetes mellitus without complication ([place])   . Diverticulitis   . DKA (diabetic ketoacidoses) ([place]) 07/11/2016  . Esophageal necrosis   . GERD (gastroesophageal reflux disease)   . Hematemesis 07/11/2016  . Hiatal hernia   . Hyperlipidemia   . Melena 06/2016  . Stroke ([place])   . UGIB (upper gastrointestinal bleed) 06/2016  . Ulcerative esophagitis    Past Surgical History:  Past Surgical History:  Procedure Laterality Date  . APPENDECTOMY  1956  . CHOLECYSTECTOMY  1997  . ESOPHAGOGASTRODUODENOSCOPY N/A 07/14/2016   Procedure: ESOPHAGOGASTRODUODENOSCOPY (EGD);  Surgeon: Manus Gunning, MD;  Location: [facility];  Service: Gastroenterology;  Laterality: N/A;  at bedside  . TONSILLECTOMY     age 43   HPI:  Katie Lozano is a 79 y.o. female with medical history significant of CVA, insulin-dependent diabetes mellitus,GERDR, hiatal hernia, hyperlipidemia, anemia, GI bleed, mild dementia who presented from [place] presented with increasing confusion. of generalized weakness and coffee ground vomiting. Per note, recent dx of  UTI. Found to be in DKA. Per notes re: coffee ground emesis hemoglobin stable, no vomiting and continue PPI. CXR 6/8 Airspace opacity in portions of the right mid and lower lung zones consistent wit pneumonia. MD concerned with dysphagia and ordered nectar thick liquids and puree. No prior ST notes for swallow found.    Assessment / Plan / Recommendation Clinical Impression    SLP Visit Diagnosis: Dysphagia, oral phase (R13.11)     Aspiration Risk  Mild aspiration risk    Diet Recommendation     Compensations: Minimize environmental distractions;Slow rate;Small sips/bites;Lingual sweep for clearance of pocketing    Other  Recommendations     Follow up Recommendations Skilled Nursing facility      Frequency and Duration min 2x/week  2 weeks       Prognosis Prognosis for Safe Diet Advancement: Good Barriers to Reach Goals: Cognitive deficits      Swallow Study   General HPI: Katie Lozano is a 79 y.o. female with medical history significant of CVA, insulin-dependent diabetes mellitus,GERDR, hiatal hernia, hyperlipidemia, anemia, GI bleed, mild dementia who presented from [facility] presented with increasing confusion. of generalized weakness and coffee ground vomiting. Per note, recent dx of  UTI. Found to be in DKA. Per notes re: coffee ground emesis hemoglobin stable, no vomiting and continue PPI. CXR 6/8 Airspace opacity in portions of the right mid and lower lung zones consistent wit pneumonia. MD concerned with dysphagia and ordered nectar thick liquids and puree. No prior ST notes for swallow found.  Type of Study: MBS-Modified Barium Swallow Study Previous Swallow Assessment: none Diet Prior to this Study: Dysphagia 2 (chopped);Thin liquids Temperature Spikes Noted: Yes Respiratory Status: Room air History of Recent Intubation: No Behavior/Cognition: Alert;Cooperative;Pleasant mood;Confused;Requires cueing Oral Cavity - Dentition:  (has 2 upper teeth that are significantly loose) Self-Feeding Abilities: Needs assist    Oral/Motor/Sensory Function Overall Oral Motor/Sensory Function: Within functional limits   Ice Chips     Thin Liquid      Nectar Thick     Honey  Thick     Puree     Solid            [place] Siren 12/08/2019,1:18 PM  Orbie Pyo [place].Ed Risk analyst [phone] Office [phone]

## 2019-12-08 NOTE — Plan of Care (Signed)
  Problem: Clinical Measurements: Goal: Diagnostic test results will improve Outcome: Progressing Goal: Respiratory complications will improve Outcome: Progressing Goal: Cardiovascular complication will be avoided Outcome: Progressing   Problem: Activity: Goal: Risk for activity intolerance will decrease Outcome: Progressing   Problem: Nutrition: Goal: Adequate nutrition will be maintained Outcome: Progressing   

## 2019-12-09 DIAGNOSIS — G20 Parkinson's disease: Secondary | ICD-10-CM

## 2019-12-09 DIAGNOSIS — J690 Pneumonitis due to inhalation of food and vomit: Secondary | ICD-10-CM

## 2019-12-09 DIAGNOSIS — F0281 Dementia in other diseases classified elsewhere with behavioral disturbance: Secondary | ICD-10-CM

## 2019-12-09 LAB — CBC
HCT: 35.3 % — ABNORMAL LOW (ref 36.0–46.0)
Hemoglobin: 12 g/dL (ref 12.0–15.0)
MCH: 32.9 pg (ref 26.0–34.0)
MCHC: 34 g/dL (ref 30.0–36.0)
MCV: 96.7 fL (ref 80.0–100.0)
Platelets: 245 10*3/uL (ref 150–400)
RBC: 3.65 MIL/uL — ABNORMAL LOW (ref 3.87–5.11)
RDW: 12.6 % (ref 11.5–15.5)
WBC: 7.1 10*3/uL (ref 4.0–10.5)
nRBC: 0 % (ref 0.0–0.2)

## 2019-12-09 LAB — COMPREHENSIVE METABOLIC PANEL
ALT: 13 U/L (ref 0–44)
AST: 23 U/L (ref 15–41)
Albumin: 2.2 g/dL — ABNORMAL LOW (ref 3.5–5.0)
Alkaline Phosphatase: 93 U/L (ref 38–126)
Anion gap: 11 (ref 5–15)
BUN: 12 mg/dL (ref 8–23)
CO2: 20 mmol/L — ABNORMAL LOW (ref 22–32)
Calcium: 8.1 mg/dL — ABNORMAL LOW (ref 8.9–10.3)
Chloride: 109 mmol/L (ref 98–111)
Creatinine, Ser: 0.79 mg/dL (ref 0.44–1.00)
GFR calc Af Amer: >60 mL/min (ref >60)
GFR calc non Af Amer: >60 mL/min (ref >60)
Glucose, Bld: 90 mg/dL (ref 70–99)
Potassium: 3.2 mmol/L — ABNORMAL LOW (ref 3.5–5.1)
Sodium: 140 mmol/L (ref 135–145)
Total Bilirubin: 1.1 mg/dL (ref 0.3–1.2)
Total Protein: 6 g/dL — ABNORMAL LOW (ref 6.5–8.1)

## 2019-12-09 LAB — GLUCOSE, CAPILLARY
Glucose-Capillary: 86 mg/dL (ref 70–99)
Glucose-Capillary: 131 mg/dL — ABNORMAL HIGH (ref 70–99)
Glucose-Capillary: 121 mg/dL — ABNORMAL HIGH (ref 70–99)

## 2019-12-09 MED ORDER — AMOXICILLIN-POT CLAVULANATE 875-125 MG PO TABS
1.0000 | ORAL_TABLET | Freq: Two times a day (BID) | ORAL | 0 refills | Status: AC
Start: 2019-12-09 — End: 2019-12-14

## 2019-12-09 MED ORDER — INSULIN GLARGINE 100 UNIT/ML ~~LOC~~ SOLN: 7.0000 [IU] | Freq: Every day | SUBCUTANEOUS | 11 refills | Status: AC

## 2019-12-09 NOTE — TOC Transition Note (Signed)
Transition of Care [facility]) - CM/SW Discharge Note   Patient Details  Name: Katie Lozano MRN: [id number] Date of Birth: [birth year]  Transition [facility]) CM/SW Contact:  Bethann [place], [place] Phone Number: 12/09/2019, 12:04 PM   Clinical Narrative:     Patient will DC to: [facility] Anticipated DC date: 12/09/19 Family notified:Alannah (daughter) and Nelle (sister) Transport by: Corey Harold   Per MD patient ready for DC to [facility]. They will arrange OP Palliative to follow pt.  RN, patient, patient's family, and facility notified of DC. Discharge Summary and FL2 sent to facility. RN to call report prior to discharge [phone]). DC packet on chart. Ambulance transport requested for patient.   CSW will sign off for now as social work intervention is no longer needed. Please consult [place] again if new needs arise.   Final next level of care: Skilled Nursing Facility Barriers to Discharge: No Barriers Identified   Patient Goals and CMS Choice Patient states their goals for this hospitalization and ongoing recovery are:: Back to [facility]      Discharge Placement              Patient chooses bed at:  [facility]) Patient to be transferred to facility by: [facility] to [facility] Name of family member notified: Bo Mcclintock Sister [phone] [facility] Daughter Patient and family notified of of transfer: 12/09/19  Discharge Plan and Services                                     Social Determinants of Health (SDOH) Interventions     Readmission Risk Interventions No flowsheet data found.

## 2019-12-09 NOTE — Progress Notes (Signed)
This Probation officer placed a second call to [facility].  This time I was able to give report to [place].  PTAR here to transport patient to [facility].  Family present,  aware of discharge and is in agreement.

## 2019-12-09 NOTE — Discharge Summary (Signed)
Physician Discharge Summary  Katie Lozano UUV:[id number] DOB: 06-Jan-1941 DOA: 12/04/2019  PCP: Bernerd Limbo, MD  Admit date: 12/04/2019 Discharge date: 12/09/2019  Ad[facility] From: [facility]  Disposition:  SNF   Recommendations for Outpatient Follow-up:  1. Please arrange for Palliative consultation and following at facility  2. Please closely monitor glucose on lower insulin dose and titrate up as needed 3. Please restart Plavix in 2 weeks 4. Please obtain SLP consultation -- if there are persistent concerns about swallowing ability, consider referral to GI vs Palliative care depending on patient and family goals of care 5. Remove PICC line if no longer needed       Home Health: N/A  Equipment/Devices: TBD at SNF  Discharge Condition: Poor  CODE STATUS: DNR Diet recommendation: Cardiac, diabetic  Brief/Interim Summary: Katie Lozano is an 79 y.o. F with hx dementia lives in memory care, CVA, DM, GIB who presented with confusion and coffee-ground emesis progressive over 2-3 days.  In the ER, found to have DKA, CXR showing new infiltrate.      PRINCIPAL HOSPITAL DIAGNOSIS: Aspiration pneumonia leading to DKA    Discharge Diagnoses:  Aspiration pneumonia, likely present on admission The patient was admitted and started on Zosyn.  Her Leukocytosis improved.  CXR showed a new infiltrate, and it was suspected that her it was provoked by pneumonia.  Evaluated by SLP here who found her a mild aspiration risk, recommended minimizing environmental distractions, slow rate, small bites/sips, and lingual sleep for clearance of pocketing in order to prevent aspiration.  Treated with 3 days Unasyn here, with clear clinical improvement. Patient now mentating at baseline, taking orals.  Temp < 100 F, heart rate < 100bpm, RR < 24, SpO2 at baseline.   Stable for discharge.    Continue Augmentin for 5 more days.   Diabetic ketoacidosis Diabetes Patient presented in DKA, started on  insulin drip.  She had relatively low insulin needs while here, and her home Lantus was decreased to 7 units daily.  Recommend close monitoring of glucose and titration as needed.  ESBL UTI Patient completed her course of ertapenem while in the hospital.  PICC line should be removed if no longer needed at facility.  Acute metabolic encephalopathy The patient was very sleepy and inattentive due to DKA and pneumonia.  This gradually improved until the day of discharge she was close to baseline.    Hematemesis Patient was admitted with reports of hematemesis/coffee-ground emesis.  No coffee-ground emesis was witnessed in the hospital.  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  GI follow-up was recommended, but she has had no repeat EGD after that.  Here during this hospitalization, EGD was deferred given her symptoms seem self-limited, and given her age and frailty and dementia, it did not seem that invasive procedures with a low likelihood of benefit were consonant with her goals of care.  Her previous episode of esophageal necrosis could predispose to stricture.  She should have continued monitoring by SLP, and if there is her persistent concern for oral intake, especially if solids like bread or meat, she should be referred back to GI, pending goals of care discussion with patient and family.  Hold Plavix for 2 more weeks, then restart.  Continue daily PPI.    ESBL UTI Completed ertapenem started as an outpatient.  Hiccups  Hypertension Continue metoprolol  Pressure injury, stage I, sacrum, right, present on admission  AKI  on CKD stage II Creatinine resolved back to baseline  Dementia Resume Depakote at discharge   Prior stroke Hold Plavix for two weeks, then restart           Discharge Instructions  Discharge Instructions    Discharge instructions   Complete by: As directed    Please consult  Palliative Care to follow at SNF  Please continue Augmentin for 5 more days  Please provide flutter valve and instruction on use to patient as much as is possible  Please hold Plavix, but restart in 2 weeks  Please reduce insulin but closely monitor glucose and titrate as needed   Increase activity slowly   Complete by: As directed    No wound care   Complete by: As directed      Allergies as of 12/09/2019      Reactions   Trulicity [dulaglutide] Diarrhea      Medication List    STOP taking these medications   clopidogrel 75 MG tablet Commonly known as: PLAVIX     TAKE these medications   aluminum-magnesium hydroxide-simethicone 947-096-28 MG/5ML Susp Commonly known as: MAALOX Take 30 mLs by mouth every 4 (four) hours as needed (heartburn).   amoxicillin-clavulanate 875-125 MG tablet Commonly known as: Augmentin Take 1 tablet by mouth 2 (two) times daily for 5 days.   cholecalciferol 25 MCG (1000 UNIT) tablet Commonly known as: VITAMIN D3 Take 2,000 Units by mouth at bedtime.   divalproex 250 MG DR tablet Commonly known as: Depakote Take 1 tablet (250 mg total) by mouth 2 (two) times daily.   ferrous sulfate 325 (65 FE) MG tablet Take 325 mg by mouth daily.   insulin aspart 100 UNIT/ML injection Commonly known as: novoLOG Inject 4-10 Units into the skin See admin instructions. Inject 4-10 units subcutaneously before meals and at bedtime per sliding scale: CBG 0-250 0 units, 251-350 4 units, 351-450 6 units, 451-550 8 units, 551-600 10 units and call provider   insulin glargine 100 UNIT/ML injection Commonly known as: LANTUS Inject 0.07 mLs (7 Units total) into the skin daily. What changed:   how much to take  when to take this   loperamide 2 MG tablet Commonly known as: IMODIUM A-D Take 2-4 mg by mouth See admin instructions. Take 2 tablets (4 mg) by mouth as needed for diarrhea (1st dose), then take 1 tablet (2 mg) every 6 hours as needed for diarrhea -  max 6 tablets in 24 hours   melatonin 3 MG Tabs tablet Take 6 mg by mouth at bedtime.   metoprolol succinate 25 MG 24 hr tablet Commonly known as: TOPROL-XL Take 1 tablet (25 mg total) by mouth daily.   olopatadine 0.1 % ophthalmic solution Commonly known as: PATANOL Place 1 drop into both eyes 2 (two) times daily. For dry eyes   Ozempic (1 MG/DOSE) 4 MG/3ML Sopn Generic drug: Semaglutide (1 MG/DOSE) Inject 1 mg into the skin every Thursday.   pantoprazole 40 MG tablet Commonly known as: PROTONIX Take 1 tablet (40 mg total) daily by mouth. Switch for any other PPI at similar dose and frequency   potassium chloride SA 20 MEQ tablet Commonly known as: KLOR-CON Take 1 tablet (20 mEq total) by mouth daily.   promethazine 25 MG suppository Commonly known as: PHENERGAN Place 25 mg rectally every 4 (four) hours as needed for nausea (per standing order).   simvastatin 20 MG tablet Commonly known as: ZOCOR Take 20 mg by mouth at bedtime.  Follow-up Information    Bernerd Limbo, MD. Schedule an appointment as soon as possible for a visit in 1 week(s).   Specialty: Family Medicine Contact information: [place] [phone]        Outpatient palliative at SNF Follow up.   Why: Continue to have goals of care conversation with palliative care at SNF             Allergies  Allergen Reactions  . Trulicity [Dulaglutide] Diarrhea    Consultations:     Procedures/Studies: CT ABDOMEN PELVIS WO CONTRAST  Result Date: 12/04/2019 CLINICAL DATA:  GI bleeding. Coffee ground emesis for 2 days. Patient is lethargic and loosening. EXAM: CT ABDOMEN AND PELVIS WITHOUT CONTRAST TECHNIQUE: Multidetector CT imaging of the abdomen and pelvis was performed following the standard protocol without IV contrast. COMPARISON:  CT of the abdomen and pelvis on 10/08/2019 FINDINGS: Lower chest: Bibasilar atelectasis. 5 millimeter nodule is identified in  the RIGHT UPPER lobe on image 3 of series 5. This portion of the lung was not imaged on the previous exam. There is bibasilar atelectasis, RIGHT greater than LEFT. Scattered emphysematous changes are present. There is dense atherosclerotic calcification of the coronary arteries. Atherosclerotic calcification of the thoracic aorta. Hepatobiliary: Cholecystectomy.  Liver is unremarkable. Pancreas: Unremarkable. No pancreatic ductal dilatation or surrounding inflammatory changes. Spleen: Normal in size without focal abnormality. Adrenals/Urinary Tract: Adrenal glands are normal. Intrarenal calculus in the midpole region of the RIGHT kidney is 1.8 centimeters. A punctate intrarenal calculus is identified in the LOWER pole region of the RIGHT kidney. A punctate intrarenal stone is identified in the LOWER pole the LEFT kidney, 2 millimeters. No hydronephrosis or renal mass. Ureters are unremarkable. The bladder and visualized portion of the urethra are normal. Stomach/Bowel: Stomach is normal in appearance. A radiopaque density in the dependent aspect of the mid stomach likely represents ingested tablet. Small bowel loops are normal in caliber and wall thickness. There are innumerable colonic diverticula not associated with inflammatory changes. Colon is otherwise normal in appearance. The appendix is not seen. Vascular/Lymphatic: There is extensive atherosclerotic calcification of the abdominal aorta not associated aneurysm. There is atherosclerosis at the origin of the superior mesenteric and celiac axis. Reproductive: Uterus is present.  No adnexal mass. Other: No abdominal wall hernia or abnormality. No abdominopelvic ascites. Musculoskeletal: Degenerative changes are seen in the LOWER lumbar spine. Remote wedge compression fracture of T9. IMPRESSION: 1. No evidence for acute abnormality of the abdomen or pelvis. 2. Extensive colonic diverticulosis without evidence for acute diverticulitis. 3. Nonobstructing bilateral  intrarenal calculi. 4. Cholecystectomy. 5. Remote wedge compression fracture of T9. 6. Coronary artery disease. 7. 5 millimeter nodule in the RIGHT UPPER lobe. No follow-up needed if patient is low-risk. Non-contrast chest CT can be considered in 12 months if patient is high-risk. This recommendation follows the consensus statement: Guidelines for Management of Incidental Pulmonary Nodules Detected on CT Images: From the Fleischner Society 2017; Radiology 2017; 284:228-243. 8. Aortic Atherosclerosis (ICD10-I70.0). 9.  Emphysema (ICD10-J43.9). Electronically Signed   By: Nolon Nations M.D.   On: 12/04/2019 14:08   CT Head Wo Contrast  Result Date: 12/04/2019 CLINICAL DATA:  Mental status changes of uncertain etiology; nausea and vomiting with coffee-ground emesis for 2 days, lethargic EXAM: CT HEAD WITHOUT CONTRAST TECHNIQUE: Contiguous axial images were obtained from the base of the skull through the vertex without intravenous contrast. Sagittal and coronal MPR images reconstructed from axial data set. Motion artifacts at  skull base COMPARISON:  10/16/2019 FINDINGS: Brain: Generalized atrophy. Normal ventricular morphology. No midline shift or mass effect. Small vessel chronic ischemic changes of deep cerebral white matter. Small old RIGHT parietal infarct at vertex. No intracranial hemorrhage, mass lesion, evidence of acute infarction, or extra-axial fluid collection. Vascular: Atherosclerotic calcifications of internal carotid and vertebral arteries at skull base. Skull: Skull base limited in assessment due to motion artifacts. Remaining calvaria intact Sinuses/Orbits: Grossly clear Other: N/A IMPRESSION: Atrophy with small vessel chronic ischemic changes of deep cerebral white matter. Small old high RIGHT parietal infarct. No acute intracranial abnormalities. Electronically Signed   By: Lavonia Dana M.D.   On: 12/04/2019 14:02   DG CHEST PORT 1 VIEW  Result Date: 12/06/2019 CLINICAL DATA:  Confusion and  emesis EXAM: PORTABLE CHEST 1 VIEW COMPARISON:  December 04, 2019 FINDINGS: There is airspace opacity in portions of the right mid and lower lung regions. Lungs elsewhere are clear. Heart size and pulmonary vascularity are normal. No adenopathy. No bone lesions. IMPRESSION: Airspace opacity in portions of the right mid and lower lung zones consistent with pneumonia. Lungs elsewhere clear. Cardiac silhouette within normal limits. Electronically Signed   By: Lowella Grip III M.D.   On: 12/06/2019 11:19   DG Chest Portable 1 View  Result Date: 12/04/2019 CLINICAL DATA:  Coffee-ground emesis for 2 days. EXAM: PORTABLE CHEST 1 VIEW COMPARISON:  10/16/2019 FINDINGS: Shallow lung inflation. Heart is mildly enlarged. Coronary stent partially imaged. There is mild perihilar opacity consistent with mild edema. A nodule overlying the RIGHT mid lung zone is consistent with 5 millimeter nodule identified in the RIGHT UPPER lobe on CT of the same day. There are no frank consolidations. No pneumothorax or pleural effusion. IMPRESSION: 1. Mild pulmonary edema. 2. RIGHT pulmonary nodule. Electronically Signed   By: Nolon Nations M.D.   On: 12/04/2019 14:10      Subjective: Patient feels better.  No respiratory distress, no dyspnea, no fever. Discharge Exam: Vitals:   12/08/19 2109 12/09/19 0831  BP: (!) 128/49 (!) 141/58  Pulse: 85 87  Resp:  16  Temp: 98.5 F (36.9 C) 99.3 F (37.4 C)  SpO2: 96% 90%   Vitals:   12/07/19 2304 12/08/19 1558 12/08/19 2109 12/09/19 0831  BP: (!) 120/58 131/60 (!) 128/49 (!) 141/58  Pulse: 82 87 85 87  Resp: 17 18  16   Temp: 99 F (37.2 C) 99.3 F (37.4 C) 98.5 F (36.9 C) 99.3 F (37.4 C)  TempSrc: Axillary     SpO2: 90% 91% 96% 90%    General: Pt is alert, awake, not in acute distress, lying in bed, appears tired Cardiovascular: RRR, nl S1-S2, no murmurs appreciated.   No LE edema.   Respiratory: Normal respiratory rate and rhythm.  CTAB without rales or  wheezes. Abdominal: Abdomen soft and non-tender.  No distension or HSM.   Neuro/Psych: Strength symmetric in upper and lower extremities.  Judgment and insight appear moderately impaired, however she is interactive and pleasant.   The results of significant diagnostics from this hospitalization (including imaging, microbiology, ancillary and laboratory) are listed below for reference.     Microbiology: Recent Results (from the past 240 hour(s))  SARS Coronavirus 2 by RT PCR (hospital order, performed in [facility] hospital lab) Nasopharyngeal Nasopharyngeal Swab     Status: None   Collection Time: 12/04/19  3:10 PM   Specimen: Nasopharyngeal Swab  Result Value Ref Range Status   SARS Coronavirus 2 NEGATIVE NEGATIVE Final  Comment: (NOTE) SARS-CoV-2 target nucleic acids are NOT DETECTED. The SARS-CoV-2 RNA is generally detectable in upper and lower respiratory specimens during the acute phase of infection. The lowest concentration of SARS-CoV-2 viral copies this assay can detect is 250 copies / mL. A negative result does not preclude SARS-CoV-2 infection and should not be used as the sole basis for treatment or other patient management decisions.  A negative result may occur with improper specimen collection / handling, submission of specimen other than nasopharyngeal swab, presence of viral mutation(s) within the areas targeted by this assay, and inadequate number of viral copies (<250 copies / mL). A negative result must be combined with clinical observations, patient history, and epidemiological information. Fact Sheet for Patients:   StrictlyIdeas.no Fact Sheet for Healthcare Providers: BankingDealers.co.za This test is not yet approved or cleared  by the [place] FDA and has been authorized for detection and/or diagnosis of SARS-CoV-2 by FDA under an Emergency Use Authorization (EUA).  This EUA will remain in effect (meaning  this test can be used) for the duration of the COVID-19 declaration under Section 564(b)(1) of the Act, 21 U.S.C. section 360bbb-3(b)(1), unless the authorization is terminated or revoked sooner. Performed at [facility], [place] [address], [place], [place] [address]   MRSA PCR Screening     Status: None   Collection Time: 12/04/19  9:31 PM   Specimen: Nasal Mucosa; Nasopharyngeal  Result Value Ref Range Status   MRSA by PCR NEGATIVE NEGATIVE Final    Comment:        The GeneXpert MRSA Assay (FDA approved for NASAL specimens only), is one component of a comprehensive MRSA colonization surveillance program. It is not intended to diagnose MRSA infection nor to guide or monitor treatment for MRSA infections. Performed at [facility], [place] [address] [place], [place] [address]   Culture, blood (routine x 2)     Status: None (Preliminary result)   Collection Time: 12/06/19  8:30 AM   Specimen: BLOOD RIGHT ARM  Result Value Ref Range Status   Specimen Description BLOOD RIGHT ARM  Final   Special Requests   Final    BOTTLES DRAWN AEROBIC ONLY Blood Culture results may not be optimal due to an inadequate volume of blood received in culture bottles   Culture   Final    NO GROWTH 3 DAYS Performed at [facility], [place] [address] [place], [place] [address]    Report Status PENDING  Incomplete  Culture, blood (routine x 2)     Status: None (Preliminary result)   Collection Time: 12/06/19  8:50 AM   Specimen: BLOOD RIGHT HAND  Result Value Ref Range Status   Specimen Description BLOOD RIGHT HAND  Final   Special Requests   Final    BOTTLES DRAWN AEROBIC ONLY Blood Culture results may not be optimal due to an inadequate volume of blood received in culture bottles   Culture   Final    NO GROWTH 3 DAYS Performed at [facility], [place] [address] [place], [place] [address]    Report Status PENDING  Incomplete  SARS CORONAVIRUS 2 (TAT 6-24 HRS) Nasopharyngeal  Nasopharyngeal Swab     Status: None   Collection Time: 12/07/19  5:06 PM   Specimen: Nasopharyngeal Swab  Result Value Ref Range Status   SARS Coronavirus 2 NEGATIVE NEGATIVE Final    Comment: (NOTE) SARS-CoV-2 target nucleic acids are NOT DETECTED. The SARS-CoV-2 RNA is generally detectable in  upper and lower respiratory specimens during the acute phase of infection. Negative results do not preclude SARS-CoV-2 infection, do not rule out co-infections with other pathogens, and should not be used as the sole basis for treatment or other patient management decisions. Negative results must be combined with clinical observations, patient history, and epidemiological information. The expected result is Negative. Fact Sheet for Patients: SugarRoll.be Fact Sheet for Healthcare Providers: https://www.woods-mathews.com/ This test is not yet approved or cleared by the [place] FDA and  has been authorized for detection and/or diagnosis of SARS-CoV-2 by FDA under an Emergency Use Authorization (EUA). This EUA will remain  in effect (meaning this test can be used) for the duration of the COVID-19 declaration under Section 56 4(b)(1) of the Act, 21 U.S.C. section 360bbb-3(b)(1), unless the authorization is terminated or revoked sooner. Performed at [facility], [place] [address], [place], [place] [address]      Labs: BNP (last 3 results) No results for input(s): BNP in the last 8760 hours. Basic Metabolic Panel: Recent Labs  Lab 12/05/19 0545 12/05/19 1016 12/06/19 0501 12/07/19 0335 12/08/19 0355  NA 143 143 140 136 140  K 3.4* 3.4* 3.0* 3.6 3.3*  CL 117* 117* 111 109 109  CO2 18* 17* 20* 19* 18*  GLUCOSE 141* 119* 142* 214* 181*  BUN 31* 26* 15 13 14   CREATININE 0.76 0.70 0.69 0.82 0.84  CALCIUM 8.5* 8.5* 8.1* 7.5* 7.9*  MG  --   --  1.4* 1.6*  --    Liver Function Tests: Recent Labs  Lab 12/04/19 1122 12/07/19 0335  12/08/19 0355  AST 18 14* 18  ALT 13 8 12   ALKPHOS 73 77 90  BILITOT 1.6* 1.6* 1.4*  PROT 6.3* 4.9* 5.4*  ALBUMIN 3.0* 1.9* 2.1*   Recent Labs  Lab 12/04/19 1122  LIPASE 17   Recent Labs  Lab 12/05/19 1700  AMMONIA 18   CBC: Recent Labs  Lab 12/04/19 1122 12/04/19 1227 12/05/19 1016 12/06/19 0501 12/07/19 0335 12/08/19 0355 12/09/19 0409  WBC 20.3*   < > 16.6* 13.2* 10.6* 8.8 7.1  NEUTROABS 17.3*  --  12.9* 9.7* 8.3*  --   --   HGB 14.6   < > 12.3 11.2* 10.3* 11.5* 12.0  HCT 44.4   < > 35.8* 32.3* 30.3* 33.2* 35.3*  MCV 101.4*   < > 97.0 95.8 96.8 96.5 96.7  PLT 290   < > 209 172 150 183 245   < > = values in this interval not displayed.   Cardiac Enzymes: No results for input(s): CKTOTAL, CKMB, CKMBINDEX, TROPONINI in the last 168 hours. BNP: Invalid input(s): POCBNP CBG: Recent Labs  Lab 12/08/19 0736 12/08/19 1600 12/08/19 1923 12/08/19 2310 12/09/19 0341  GLUCAP 190* 236* 203* 130* 86   D-Dimer No results for input(s): DDIMER in the last 72 hours. Hgb A1c No results for input(s): HGBA1C in the last 72 hours. Lipid Profile No results for input(s): CHOL, HDL, LDLCALC, TRIG, CHOLHDL, LDLDIRECT in the last 72 hours. Thyroid function studies No results for input(s): TSH, T4TOTAL, T3FREE, THYROIDAB in the last 72 hours.  Invalid input(s): FREET3 Anemia work up No results for input(s): VITAMINB12, FOLATE, FERRITIN, TIBC, IRON, RETICCTPCT in the last 72 hours. Urinalysis    Component Value Date/Time   COLORURINE STRAW (A) 12/04/2019 1425   APPEARANCEUR CLEAR 12/04/2019 1425   LABSPEC 1.022 12/04/2019 1425   PHURINE 5.0 12/04/2019 1425   GLUCOSEU >=500 (A) 12/04/2019 1425  HGBUR NEGATIVE 12/04/2019 [place] 12/04/2019 1425   KETONESUR 80 (A) 12/04/2019 1425   PROTEINUR NEGATIVE 12/04/2019 1425   NITRITE NEGATIVE 12/04/2019 1425   LEUKOCYTESUR NEGATIVE 12/04/2019 1425   Sepsis Labs Invalid input(s): PROCALCITONIN,  WBC,   LACTICIDVEN Microbiology Recent Results (from the past 240 hour(s))  SARS Coronavirus 2 by RT PCR (hospital order, performed in [facility] hospital lab) Nasopharyngeal Nasopharyngeal Swab     Status: None   Collection Time: 12/04/19  3:10 PM   Specimen: Nasopharyngeal Swab  Result Value Ref Range Status   SARS Coronavirus 2 NEGATIVE NEGATIVE Final    Comment: (NOTE) SARS-CoV-2 target nucleic acids are NOT DETECTED. The SARS-CoV-2 RNA is generally detectable in upper and lower respiratory specimens during the acute phase of infection. The lowest concentration of SARS-CoV-2 viral copies this assay can detect is 250 copies / mL. A negative result does not preclude SARS-CoV-2 infection and should not be used as the sole basis for treatment or other patient management decisions.  A negative result may occur with improper specimen collection / handling, submission of specimen other than nasopharyngeal swab, presence of viral mutation(s) within the areas targeted by this assay, and inadequate number of viral copies (<250 copies / mL). A negative result must be combined with clinical observations, patient history, and epidemiological information. Fact Sheet for Patients:   StrictlyIdeas.no Fact Sheet for Healthcare Providers: BankingDealers.co.za This test is not yet approved or cleared  by the [place] FDA and has been authorized for detection and/or diagnosis of SARS-CoV-2 by FDA under an Emergency Use Authorization (EUA).  This EUA will remain in effect (meaning this test can be used) for the duration of the COVID-19 declaration under Section 564(b)(1) of the Act, 21 U.S.C. section 360bbb-3(b)(1), unless the authorization is terminated or revoked sooner. Performed at [facility], [place] [address], [place], [place] [address]   MRSA PCR Screening     Status: None   Collection Time: 12/04/19  9:31 PM   Specimen: Nasal Mucosa;  Nasopharyngeal  Result Value Ref Range Status   MRSA by PCR NEGATIVE NEGATIVE Final    Comment:        The GeneXpert MRSA Assay (FDA approved for NASAL specimens only), is one component of a comprehensive MRSA colonization surveillance program. It is not intended to diagnose MRSA infection nor to guide or monitor treatment for MRSA infections. Performed at [facility], [place] [address] [place], [place] [address]   Culture, blood (routine x 2)     Status: None (Preliminary result)   Collection Time: 12/06/19  8:30 AM   Specimen: BLOOD RIGHT ARM  Result Value Ref Range Status   Specimen Description BLOOD RIGHT ARM  Final   Special Requests   Final    BOTTLES DRAWN AEROBIC ONLY Blood Culture results may not be optimal due to an inadequate volume of blood received in culture bottles   Culture   Final    NO GROWTH 3 DAYS Performed at [facility], [place] [address] [place], [place] [address]    Report Status PENDING  Incomplete  Culture, blood (routine x 2)     Status: None (Preliminary result)   Collection Time: 12/06/19  8:50 AM   Specimen: BLOOD RIGHT HAND  Result Value Ref Range Status   Specimen Description BLOOD RIGHT HAND  Final   Special Requests   Final    BOTTLES DRAWN AEROBIC ONLY Blood Culture results may not be  optimal due to an inadequate volume of blood received in culture bottles   Culture   Final    NO GROWTH 3 DAYS Performed at [facility], [place] [address], [place], [place] [address]    Report Status PENDING  Incomplete  SARS CORONAVIRUS 2 (TAT 6-24 HRS) Nasopharyngeal Nasopharyngeal Swab     Status: None   Collection Time: 12/07/19  5:06 PM   Specimen: Nasopharyngeal Swab  Result Value Ref Range Status   SARS Coronavirus 2 NEGATIVE NEGATIVE Final    Comment: (NOTE) SARS-CoV-2 target nucleic acids are NOT DETECTED. The SARS-CoV-2 RNA is generally detectable in upper and lower respiratory specimens during the acute phase of infection.  Negative results do not preclude SARS-CoV-2 infection, do not rule out co-infections with other pathogens, and should not be used as the sole basis for treatment or other patient management decisions. Negative results must be combined with clinical observations, patient history, and epidemiological information. The expected result is Negative. Fact Sheet for Patients: SugarRoll[employer]be Fact Sheet for Healthcare Providers: https://www.woods-mathews.com/ This test is not yet approved or cleared by the [place] FDA and  has been authorized for detection and/or diagnosis of SARS-CoV-2 by FDA under an Emergency Use Authorization (EUA). This EUA will remain  in effect (meaning this test can be used) for the duration of the COVID-19 declaration under Section 56 4(b)(1) of the Act, 21 U.S.C. section 360bbb-3(b)(1), unless the authorization is terminated or revoked sooner. Performed at [facility], [place] [address] [place], [place] [address]      Time coordinating discharge: 25 minutes      SIGNED:   Edwin Dada, MD  [facility] 12/09/2019, 8:33 AM

## 2019-12-09 NOTE — Care Management Important Message (Signed)
Important Message  Patient Details  Name: Katie Lozano MRN: [id number] Date of Birth: 1941/02/28   Medicare Important Message Given:  Yes     [name] 12/09/2019, 2:35 PM

## 2019-12-11 LAB — CULTURE, BLOOD (ROUTINE X 2)
Culture: NO GROWTH
Culture: NO GROWTH

## 2019-12-16 DIAGNOSIS — A419 Sepsis, unspecified organism: Secondary | ICD-10-CM

## 2019-12-29 DEATH — deceased

## 2021-02-18 ENCOUNTER — Encounter: Payer: Self-pay | Admitting: Gastroenterology
